# Patient Record
Sex: Female | Born: 1950
Health system: Southern US, Community
[De-identification: ages and names within clinical notes are randomized; demographics above are authoritative.]

## PROBLEM LIST (undated history)

## (undated) DIAGNOSIS — J449 Chronic obstructive pulmonary disease, unspecified: Secondary | ICD-10-CM

## (undated) DIAGNOSIS — R0609 Other forms of dyspnea: Secondary | ICD-10-CM

## (undated) DIAGNOSIS — M069 Rheumatoid arthritis, unspecified: Secondary | ICD-10-CM

## (undated) DIAGNOSIS — R1310 Dysphagia, unspecified: Secondary | ICD-10-CM

## (undated) DIAGNOSIS — E739 Lactose intolerance, unspecified: Secondary | ICD-10-CM

## (undated) DIAGNOSIS — K5790 Diverticulosis of intestine, part unspecified, without perforation or abscess without bleeding: Secondary | ICD-10-CM

## (undated) DIAGNOSIS — E119 Type 2 diabetes mellitus without complications: Secondary | ICD-10-CM

## (undated) DIAGNOSIS — K5900 Constipation, unspecified: Secondary | ICD-10-CM

## (undated) DIAGNOSIS — R600 Localized edema: Secondary | ICD-10-CM

## (undated) DIAGNOSIS — R079 Chest pain, unspecified: Secondary | ICD-10-CM

## (undated) DIAGNOSIS — R0600 Dyspnea, unspecified: Secondary | ICD-10-CM

## (undated) DIAGNOSIS — E785 Hyperlipidemia, unspecified: Secondary | ICD-10-CM

## (undated) DIAGNOSIS — Z8619 Personal history of other infectious and parasitic diseases: Secondary | ICD-10-CM

## (undated) DIAGNOSIS — F329 Major depressive disorder, single episode, unspecified: Secondary | ICD-10-CM

## (undated) DIAGNOSIS — J301 Allergic rhinitis due to pollen: Secondary | ICD-10-CM

## (undated) DIAGNOSIS — Z87891 Personal history of nicotine dependence: Secondary | ICD-10-CM

## (undated) DIAGNOSIS — F419 Anxiety disorder, unspecified: Secondary | ICD-10-CM

## (undated) DIAGNOSIS — F32A Depression, unspecified: Secondary | ICD-10-CM

## (undated) DIAGNOSIS — E669 Obesity, unspecified: Secondary | ICD-10-CM

## (undated) DIAGNOSIS — M1990 Unspecified osteoarthritis, unspecified site: Secondary | ICD-10-CM

## (undated) DIAGNOSIS — H269 Unspecified cataract: Secondary | ICD-10-CM

## (undated) HISTORY — DX: Lactose intolerance, unspecified: E73.9

## (undated) HISTORY — DX: Personal history of nicotine dependence: Z87.891

## (undated) HISTORY — PX: TUBAL LIGATION: SHX77

## (undated) HISTORY — DX: Dysphagia, unspecified: R13.10

## (undated) HISTORY — PX: WISDOM TOOTH EXTRACTION: SHX21

## (undated) HISTORY — PX: OTHER SURGICAL HISTORY: SHX169

## (undated) HISTORY — DX: Type 2 diabetes mellitus without complications: E11.9

## (undated) HISTORY — DX: Other forms of dyspnea: R06.09

## (undated) HISTORY — DX: Constipation, unspecified: K59.00

## (undated) HISTORY — DX: Hyperlipidemia, unspecified: E78.5

## (undated) HISTORY — PX: LIPOMA EXCISION: SHX5283

## (undated) HISTORY — DX: Unspecified osteoarthritis, unspecified site: M19.90

## (undated) HISTORY — DX: Major depressive disorder, single episode, unspecified: F32.9

## (undated) HISTORY — DX: Rheumatoid arthritis, unspecified: M06.9

## (undated) HISTORY — DX: Depression, unspecified: F32.A

## (undated) HISTORY — DX: Localized edema: R60.0

## (undated) HISTORY — PX: BUNIONECTOMY: SHX129

## (undated) HISTORY — DX: Unspecified cataract: H26.9

## (undated) HISTORY — DX: Allergic rhinitis due to pollen: J30.1

## (undated) HISTORY — PX: CATARACT EXTRACTION, BILATERAL: SHX1313

## (undated) HISTORY — DX: Personal history of other infectious and parasitic diseases: Z86.19

## (undated) HISTORY — DX: Dyspnea, unspecified: R06.00

## (undated) HISTORY — DX: Diverticulosis of intestine, part unspecified, without perforation or abscess without bleeding: K57.90

## (undated) HISTORY — DX: Chronic obstructive pulmonary disease, unspecified: J44.9

## (undated) HISTORY — DX: Chest pain, unspecified: R07.9

## (undated) HISTORY — PX: DENTAL SURGERY: SHX609

## (undated) HISTORY — DX: Obesity, unspecified: E66.9

---

## 1988-03-11 HISTORY — PX: VAGINAL HYSTERECTOMY: SUR661

## 2004-03-11 HISTORY — PX: OTHER SURGICAL HISTORY: SHX169

## 2005-11-22 ENCOUNTER — Emergency Department: Payer: Self-pay | Admitting: Emergency Medicine

## 2010-03-11 HISTORY — PX: COLONOSCOPY: SHX174

## 2010-07-27 ENCOUNTER — Emergency Department: Payer: Self-pay | Admitting: Emergency Medicine

## 2010-08-21 ENCOUNTER — Encounter: Payer: Self-pay | Admitting: Family Medicine

## 2010-08-21 ENCOUNTER — Ambulatory Visit (INDEPENDENT_AMBULATORY_CARE_PROVIDER_SITE_OTHER): Payer: BC Managed Care – PPO | Admitting: Family Medicine

## 2010-08-21 VITALS — BP 126/76 | HR 68 | Temp 98.6°F | Ht 63.0 in | Wt 178.1 lb

## 2010-08-21 DIAGNOSIS — Z0000 Encounter for general adult medical examination without abnormal findings: Secondary | ICD-10-CM

## 2010-08-21 DIAGNOSIS — Z1231 Encounter for screening mammogram for malignant neoplasm of breast: Secondary | ICD-10-CM

## 2010-08-21 DIAGNOSIS — F32A Depression, unspecified: Secondary | ICD-10-CM

## 2010-08-21 DIAGNOSIS — F3289 Other specified depressive episodes: Secondary | ICD-10-CM

## 2010-08-21 DIAGNOSIS — F329 Major depressive disorder, single episode, unspecified: Secondary | ICD-10-CM | POA: Insufficient documentation

## 2010-08-21 DIAGNOSIS — N644 Mastodynia: Secondary | ICD-10-CM

## 2010-08-21 MED ORDER — NAPROXEN 500 MG PO TABS: ORAL_TABLET | ORAL | Status: DC

## 2010-08-21 MED ORDER — PAROXETINE HCL 20 MG PO TABS: 30.0000 mg | ORAL_TABLET | ORAL | Status: DC

## 2010-08-21 NOTE — Patient Instructions (Addendum)
Good to meet you today.  Return at your convenience in next few weeks for physical. Return day of or a few days prior fasting for blood work. Pass by [facility]'s office to set up with mammogram. For breast pain - trial of naproxyn twice daily with food for next week then as needed for pain. Increase paxil to 30mg  daily (1 1/2 tablets daily). Call clinic with questions.

## 2010-08-21 NOTE — Assessment & Plan Note (Signed)
Breast exam reassuring, previous cyst scar near site of current nodule, anticipate scar tissue.  As due for mammogram, have set up with this as well. Could also be chest wall strain.  Xray at ER without evidence of rib fx.

## 2010-08-21 NOTE — Assessment & Plan Note (Addendum)
As has responded to paxil in past, continue this med, slowly titrate dose. Pt agrees to maximize one med prior to adjuvant med. Consider PHQ 9 next visit. f/u 1 mo.

## 2010-08-21 NOTE — Progress Notes (Signed)
Addended by: Eustaquio Boyden on: 08/21/2010 02:05 PM   Modules accepted: Orders

## 2010-08-21 NOTE — Progress Notes (Signed)
Subjective:    Patient ID: Wanda Burns, female    DOB: [birth year], 60 y.o.   MRN: [id number]  HPI CC: new patient, several issues  Presents to establish today.  Depression - dx 110s.  no energy, tired, no interest, still feeling depressed.  Has goals/projects and avoids them.  Has been on paxil for last several years (?2005).  Recently increased from 10mg  to 20mg  2007.  Did well for a little bit but recently in last 8-9 mo noticing depression getting worse.  Increased stressors recently.  Breast issue - previously though was pulled muscle and treated 2002, seems to be getting worse.  Told had breast nodules, very painful, feels lateral breast masses.  Very tender.  Has had cyst in past, last year drained.  Endorses 6/10 constant pain daily.  Went to [facility] ER for breast pain, told normal EKG and CXR.  Has also noticed significant weight loss recently, wonders if due to stress.  189 --> 178 lbs in last 2 months.  Denies discharge from breast.  Received records from [facility] and reviewed - normal EKG, CXR, CBC.  H/o inner ear problems.  Smoking currently, interested in quitting.  Currently <1ppd.  Preventative: Tetanus - unsure. Last CPE with blood work - 2007-8.  Got well woman exams at previous PCP's. H/o hysterectomy 1990. Last mammogram - unsure, due. Also states due for colonoscopy.  Medications and allergies reviewed and updated in chart. Past Medical History  Diagnosis Date  . Arthritis     fingers  . Depression   . History of chicken pox   . Hay fever    Past Surgical History  Procedure Date  . Vaginal hysterectomy 1990  . Bilateral tubal   . Lipoma excision     reports that she has been smoking Cigarettes.  She has been smoking about 1 pack per day. She has never used smokeless tobacco. She reports that she drinks alcohol. She reports that she uses illicit drugs. family history includes Alcohol abuse in her father; Colon cancer (age of onset:70) in her maternal uncle;  Coronary artery disease (age of onset:50) in her father; Diabetes in her brother, maternal aunt, and mother; Emphysema in her mother; Hypertension in her mother; and Stroke in her maternal grandmother. No Known Allergies  Review of Systems  Constitutional: Positive for unexpected weight change. Negative for fever, chills, activity change, appetite change and fatigue.  HENT: Negative for hearing loss and neck pain.   Eyes: Negative for visual disturbance.  Respiratory: Positive for cough (attributes to smoking). Negative for choking, chest tightness, shortness of breath and wheezing.   Cardiovascular: Negative for chest pain, palpitations and leg swelling.  Gastrointestinal: Negative for nausea, vomiting, abdominal pain, diarrhea, constipation, blood in stool and abdominal distention.  Genitourinary: Negative for hematuria and difficulty urinating.  Musculoskeletal: Negative for myalgias and arthralgias.  Skin: Negative for rash.  Neurological: Negative for dizziness, seizures, syncope and headaches.  Hematological: Does not bruise/bleed easily.  Psychiatric/Behavioral: Positive for dysphoric mood. The patient is not nervous/anxious.        Objective:   Physical Exam  Nursing note and vitals reviewed. Constitutional: She is oriented to person, place, and time. She appears well-developed and well-nourished. No distress.  HENT:  Head: Normocephalic and atraumatic.  Right Ear: External ear normal.  Left Ear: External ear normal.  Nose: Nose normal.  Mouth/Throat: Oropharynx is clear and moist.  Eyes: Conjunctivae and EOM are normal. Pupils are equal, round, and reactive to light.  Neck: Normal  range of motion. Neck supple. No thyromegaly present.  Cardiovascular: Normal rate, regular rhythm, normal heart sounds and intact distal pulses.   No murmur heard. Pulses:      Radial pulses are 2+ on the right side, and 2+ on the left side.  Pulmonary/Chest: Effort normal and breath sounds  normal. No respiratory distress. She has no wheezes. She has no rales. Right breast exhibits no inverted nipple, no mass, no nipple discharge, no skin change and no tenderness. Left breast exhibits mass, skin change and tenderness. Left breast exhibits no inverted nipple and no nipple discharge. Breasts are symmetrical.         No masses on breast appreciated.  Abdominal: Soft. Bowel sounds are normal. She exhibits no distension and no mass. There is no tenderness. There is no rebound and no guarding.  Musculoskeletal: Normal range of motion.  Lymphadenopathy:    She has no cervical adenopathy.    She has no axillary adenopathy.       Right: No supraclavicular adenopathy present.       Left: No supraclavicular adenopathy present.  Neurological: She is alert and oriented to person, place, and time.       CN grossly intact, station and gait intact  Skin: Skin is warm and dry. No rash noted.  Psychiatric: She has a normal mood and affect. Her behavior is normal. Judgment and thought content normal.          Assessment & Plan:

## 2010-08-23 ENCOUNTER — Encounter: Payer: Self-pay | Admitting: Family Medicine

## 2010-08-23 ENCOUNTER — Ambulatory Visit
Admission: RE | Admit: 2010-08-23 | Discharge: 2010-08-23 | Disposition: A | Discharge status: Home / Self Care | Payer: BC Managed Care – PPO | Source: Ambulatory Visit | Attending: Family Medicine | Admitting: Family Medicine

## 2010-08-23 DIAGNOSIS — N644 Mastodynia: Secondary | ICD-10-CM

## 2010-08-24 ENCOUNTER — Encounter: Payer: Self-pay | Admitting: *Deleted

## 2010-08-24 ENCOUNTER — Telehealth: Payer: Self-pay | Admitting: *Deleted

## 2010-08-24 MED ORDER — CYCLOBENZAPRINE HCL 10 MG PO TABS: 10.0000 mg | ORAL_TABLET | Freq: Two times a day (BID) | ORAL | Status: AC | PRN

## 2010-08-24 NOTE — Telephone Encounter (Signed)
Trial of flexeril.  May take 1/2 pill first to ensure tolerates, may cause sedation.  Start at night.

## 2010-08-24 NOTE — Telephone Encounter (Signed)
Patient was seen earlier this week for breast pain. She says that the medication is not helping with the pain and is asking if she can have something else called in. Uses [facility] on [address].

## 2010-08-27 NOTE — Telephone Encounter (Signed)
Spoke with patient and advised results   

## 2010-09-04 ENCOUNTER — Other Ambulatory Visit (INDEPENDENT_AMBULATORY_CARE_PROVIDER_SITE_OTHER): Payer: BC Managed Care – PPO | Admitting: Family Medicine

## 2010-09-04 DIAGNOSIS — F3289 Other specified depressive episodes: Secondary | ICD-10-CM

## 2010-09-04 DIAGNOSIS — F329 Major depressive disorder, single episode, unspecified: Secondary | ICD-10-CM

## 2010-09-04 DIAGNOSIS — Z0000 Encounter for general adult medical examination without abnormal findings: Secondary | ICD-10-CM

## 2010-09-04 DIAGNOSIS — F32A Depression, unspecified: Secondary | ICD-10-CM

## 2010-09-04 LAB — COMPREHENSIVE METABOLIC PANEL
ALT: 20 U/L (ref 0–35)
AST: 19 U/L (ref 0–37)
Albumin: 4.4 g/dL (ref 3.5–5.2)
Alkaline Phosphatase: 121 U/L — ABNORMAL HIGH (ref 39–117)
Potassium: 4 mEq/L (ref 3.5–5.1)
Sodium: 140 mEq/L (ref 135–145)
Total Protein: 6.6 g/dL (ref 6.0–8.3)

## 2010-09-04 LAB — CBC WITH DIFFERENTIAL/PLATELET
Basophils Absolute: 0 10*3/uL (ref 0.0–0.1)
HCT: 41.3 % (ref 36.0–46.0)
Hemoglobin: 14 g/dL (ref 12.0–15.0)
Lymphs Abs: 3.1 10*3/uL (ref 0.7–4.0)
MCV: 92.9 fl (ref 78.0–100.0)
Monocytes Absolute: 0.5 10*3/uL (ref 0.1–1.0)
Neutro Abs: 5.1 10*3/uL (ref 1.4–7.7)
Platelets: 271 10*3/uL (ref 150.0–400.0)
RDW: 13 % (ref 11.5–14.6)

## 2010-09-04 LAB — LIPID PANEL
HDL: 38.6 mg/dL — ABNORMAL LOW (ref >39.00)
Total CHOL/HDL Ratio: 6
VLDL: 26.8 mg/dL (ref 0.0–40.0)

## 2010-09-05 LAB — LDL CHOLESTEROL, DIRECT: Direct LDL: 146.2 mg/dL

## 2010-09-11 ENCOUNTER — Ambulatory Visit (INDEPENDENT_AMBULATORY_CARE_PROVIDER_SITE_OTHER): Payer: 59 | Admitting: Family Medicine

## 2010-09-11 ENCOUNTER — Encounter: Payer: Self-pay | Admitting: Family Medicine

## 2010-09-11 DIAGNOSIS — F17200 Nicotine dependence, unspecified, uncomplicated: Secondary | ICD-10-CM

## 2010-09-11 DIAGNOSIS — R071 Chest pain on breathing: Secondary | ICD-10-CM

## 2010-09-11 DIAGNOSIS — Z1211 Encounter for screening for malignant neoplasm of colon: Secondary | ICD-10-CM

## 2010-09-11 DIAGNOSIS — Z23 Encounter for immunization: Secondary | ICD-10-CM

## 2010-09-11 DIAGNOSIS — F329 Major depressive disorder, single episode, unspecified: Secondary | ICD-10-CM

## 2010-09-11 DIAGNOSIS — F3289 Other specified depressive episodes: Secondary | ICD-10-CM

## 2010-09-11 DIAGNOSIS — F32A Depression, unspecified: Secondary | ICD-10-CM

## 2010-09-11 DIAGNOSIS — Z0000 Encounter for general adult medical examination without abnormal findings: Secondary | ICD-10-CM

## 2010-09-11 DIAGNOSIS — R0789 Other chest pain: Secondary | ICD-10-CM

## 2010-09-11 MED ORDER — LORATADINE 10 MG PO TABS: 10.0000 mg | ORAL_TABLET | Freq: Every day | ORAL | Status: DC

## 2010-09-11 NOTE — Patient Instructions (Addendum)
Tetanus today. Keep working on lowering cholesterol levels.  Goal bad cholesterol (LDL) for you is <130. Keep working on quitting smoking.  If you need help, let us know.  Look into [email or web address].  Signed up today, expect a call. Pass by Marion's office to schedule colonoscopy. Call insurance to see if physical therapy is covered for muscle wall strain and we will call you to set that up. Good to see you today.  Call us with questions.

## 2010-09-11 NOTE — Progress Notes (Signed)
Subjective:    Patient ID: Wanda Burns, female    DOB: 05/17/1950, 60 y.o.   MRN: [id number]  HPI CC: CPE  Still having issues with left chest wall pain.  Mammogram normal.  Wanda Burns thinks muscle issue.  Flexeril helps but unable to take because of work (sedation).  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  Has been doing this straight for 2 months because others are out.  Wanda Burns is leftie.  Notes worsening with recent increase in job.  Last 2 mornings, improved as hasn't needed to do routes.  Heat helps.  Hasn't tried ice.  Naproxen didn't help, upset stomach.  Depression - mood improved.  Improved motivation, energy level.  paxil 20mg  daily.  Endorsing continued low energy level.  Wants to ask about 2 cysts - one on back of neck, another one left lateral chest wall (painful).  Previously on claritin-D, would like refill.  smoking - 1 ppd.  Precontemplative.  interested in quitline resources  Wt Readings from Last 3 Encounters:  09/11/10 177 lb 1.3 oz (80.323 kg)  08/21/10 178 lb 1.9 oz (80.795 kg)   Reviewed blood work in detail.  Preventative:  Tetanus - Tdap due today. H/o hysterectomy 1990 due to heavy bleeding and pain with periods.  Unsure if ovaries remain. Last mammogram - mammogram 08/2010 normal Also states due for colonoscopy.  Would like to set up as family with h/o colon cancer (age 19).  Medications and allergies reviewed and updated in chart. Patient Active Problem List  Diagnoses  . Breast pain  . Depression   Past Medical History  Diagnosis Date  . Arthritis     fingers  . Depression   . History of chicken pox   . Hay fever    Past Surgical History  Procedure Date  . Vaginal hysterectomy 1990  . Bilateral tubal   . Lipoma excision    History  Substance Use Topics  . Smoking status: Current Everyday Smoker -- 1.0 packs/day    Types: Cigarettes  . Smokeless  tobacco: Never Used   Comment: Less than 1/2 PPD  . Alcohol Use: Yes     Occasionally   Family History  Problem Relation Age of Onset  . Diabetes Mother   . Hypertension Mother   . Emphysema Mother   . Coronary artery disease Father 30  . Alcohol abuse Father   . Diabetes Brother   . Diabetes Maternal Aunt   . Colon cancer Maternal Uncle 70  . Stroke Maternal Grandmother    No Known Allergies Current Outpatient Prescriptions on File Prior to Visit  Medication Sig Dispense Refill  . cyclobenzaprine (FLEXERIL) 10 MG tablet Take 1 tablet (10 mg total) by mouth 2 (two) times daily as needed for muscle spasms. Sedation precautions  30 tablet  1  . loratadine (CLARITIN) 10 MG tablet Take 10 mg by mouth daily.        Marland Kitchen LORazepam (ATIVAN) 0.5 MG tablet Take 0.5 mg by mouth 2 (two) times daily.        . naproxen (NAPROSYN) 500 MG tablet Take one po bid x 1 week then prn pain, take with food  60 tablet  0  . PARoxetine (PAXIL) 20 MG tablet Take 1.5 tablets (30 mg total) by mouth every morning.  45 tablet  3   Review of Systems  Constitutional: Positive for appetite change  and unexpected weight change. Negative for fever, chills, activity change and fatigue.  HENT: Negative for hearing loss and neck pain.   Eyes: Negative for visual disturbance.  Respiratory: Positive for wheezing. Negative for cough, chest tightness and shortness of breath.   Cardiovascular: Negative for chest pain, palpitations and leg swelling.  Gastrointestinal: Negative for nausea, vomiting, abdominal pain, diarrhea, constipation, blood in stool and abdominal distention.  Genitourinary: Negative for hematuria and difficulty urinating.  Musculoskeletal: Negative for myalgias and arthralgias.  Skin: Negative for rash.  Neurological: Negative for dizziness, seizures, syncope and headaches.  Hematological: Does not bruise/bleed easily.  Psychiatric/Behavioral: Negative for dysphoric mood. The patient is not  nervous/anxious.       Objective:   Physical Exam  Nursing note and vitals reviewed. Constitutional: Wanda Burns is oriented to person, place, and time. Wanda Burns appears well-developed and well-nourished. No distress.  HENT:  Head: Normocephalic and atraumatic.  Right Ear: External ear normal.  Left Ear: External ear normal.  Nose: Nose normal.  Mouth/Throat: Oropharynx is clear and moist.  Eyes: Conjunctivae and EOM are normal. Pupils are equal, round, and reactive to light.  Neck: Normal range of motion. Neck supple. No thyromegaly present.  Cardiovascular: Normal rate, normal heart sounds and intact distal pulses.   No murmur heard. Pulses:      Radial pulses are 2+ on the right side, and 2+ on the left side.  Pulmonary/Chest: Effort normal and breath sounds normal. No respiratory distress. Wanda Burns has no wheezes. Wanda Burns has no rales.  Abdominal: Soft. Bowel sounds are normal. Wanda Burns exhibits no distension and no mass. There is no tenderness. There is no rebound and no guarding.  Musculoskeletal: Normal range of motion.       Left lateral chest wall tender to palpation at multiple ribs  Lymphadenopathy:    Wanda Burns has no cervical adenopathy.  Neurological: Wanda Burns is alert and oriented to person, place, and time.       CN grossly intact, station and gait intact  Skin: Skin is warm and dry. No rash noted.       Posterior to neck noninflamed sebaceous cyst.   Left lateral chest wall tender, residual erythema from presumed cyst  Psychiatric: Wanda Burns has a normal mood and affect. Her behavior is normal. Judgment and thought content normal.          Assessment & Plan:

## 2010-09-12 ENCOUNTER — Encounter: Payer: Self-pay | Admitting: Family Medicine

## 2010-09-12 DIAGNOSIS — Z87891 Personal history of nicotine dependence: Secondary | ICD-10-CM | POA: Insufficient documentation

## 2010-09-12 DIAGNOSIS — Z0000 Encounter for general adult medical examination without abnormal findings: Secondary | ICD-10-CM | POA: Insufficient documentation

## 2010-09-12 DIAGNOSIS — R0789 Other chest pain: Secondary | ICD-10-CM | POA: Insufficient documentation

## 2010-09-12 NOTE — Assessment & Plan Note (Signed)
Improved. Continue current dose.

## 2010-09-12 NOTE — Assessment & Plan Note (Addendum)
mammo normal. Unsure if cyst causing pain or muscular.  Anticipate muscular - possible pulled lat dorsi muscle. Anticipate job worsening given repetitive motion of throwing exacerbating pain. Recommended light duty, no throwing for next 2 wks and will refer to PT to work on rib strain. Continue flexeril, ice/heat.  naprosyn didn't help. CXR at ER early June normal.

## 2010-09-12 NOTE — Assessment & Plan Note (Addendum)
Discussed healthy eating.  Reviewed preventative care protocols and updated as needed. Tdap today. Discussed smoking cessation. Pt would like to set up colonoscopy, will refer for this. UTD mammo.  No paps as h/o hysterectomy for benign reasons.

## 2010-09-12 NOTE — Assessment & Plan Note (Signed)
Discussed cessation. Pt interetsed in quit line and to set up with coach, filled out sign up form today.

## 2010-09-25 ENCOUNTER — Encounter: Payer: Self-pay | Admitting: Gastroenterology

## 2010-09-25 ENCOUNTER — Ambulatory Visit (AMBULATORY_SURGERY_CENTER): Payer: 59

## 2010-09-25 VITALS — Ht 63.0 in | Wt 179.7 lb

## 2010-09-25 DIAGNOSIS — Z1211 Encounter for screening for malignant neoplasm of colon: Secondary | ICD-10-CM

## 2010-09-25 DIAGNOSIS — Z800 Family history of malignant neoplasm of digestive organs: Secondary | ICD-10-CM

## 2010-09-25 MED ORDER — PEG-KCL-NACL-NASULF-NA ASC-C 100 G PO SOLR: 1.0000 | Freq: Once | ORAL | Status: AC

## 2010-09-28 ENCOUNTER — Encounter: Payer: Self-pay | Admitting: Family Medicine

## 2010-09-28 ENCOUNTER — Ambulatory Visit (INDEPENDENT_AMBULATORY_CARE_PROVIDER_SITE_OTHER): Payer: 59 | Admitting: Family Medicine

## 2010-09-28 VITALS — BP 116/78 | HR 72 | Temp 98.5°F | Wt 178.1 lb

## 2010-09-28 DIAGNOSIS — R071 Chest pain on breathing: Secondary | ICD-10-CM

## 2010-09-28 DIAGNOSIS — F32A Depression, unspecified: Secondary | ICD-10-CM

## 2010-09-28 DIAGNOSIS — F329 Major depressive disorder, single episode, unspecified: Secondary | ICD-10-CM

## 2010-09-28 DIAGNOSIS — F3289 Other specified depressive episodes: Secondary | ICD-10-CM

## 2010-09-28 DIAGNOSIS — F17200 Nicotine dependence, unspecified, uncomplicated: Secondary | ICD-10-CM

## 2010-09-28 DIAGNOSIS — R0789 Other chest pain: Secondary | ICD-10-CM

## 2010-09-28 NOTE — Patient Instructions (Signed)
Good to see you today, continue to work on cutting back on smoking. Return sometime next month for procedure to try and remove cyst.

## 2010-09-28 NOTE — Progress Notes (Signed)
  Subjective:    Patient ID: Wanda Burns, female    DOB: [birth year], 60 y.o.   MRN: [id number]  HPI CC: 2 wk f/u  Last visit - Still having issues with left chest wall pain. Mammogram normal. Pt thinks muscle issue. Flexeril helps but unable to take because of work (sedation).  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 Had been doing this straight for 2 months because others are out. Pt is left handed.  Notes worsening with recent increase in job. Last 2 mornings, improved as hasn't needed to do routes. Heat helps. Hasn't tried ice. Naproxen didn't help because upset stomach.  Now - pain improved but still there some.  Had recommended physical therapy, pt has held off on this because having financial issues.  Thinks pain worse with stress.  Has noted improvement with light duty as well as less throwing of newspaper.  Currently job hunting for less stress, less exhertion type work.  Wants cyst checked out again.  Has had mult cysts in past removed.  Has seen derm in past.  This cyst on left ribcage/chest wall bothering her because painful.  Wants eventually removed.  Drained on its own 1 mo ago.  Smoking < 1 ppd.  Did not f/u with Quitline resources.  Review of Systems Per HPI    Objective:   Physical Exam  Nursing note and vitals reviewed. Constitutional: She appears well-developed and well-nourished. No distress.  HENT:  Head: Normocephalic and atraumatic.  Mouth/Throat: Oropharynx is clear and moist. No oropharyngeal exudate.  Eyes: Conjunctivae and EOM are normal. Pupils are equal, round, and reactive to light.  Cardiovascular: Normal rate, regular rhythm, normal heart sounds and intact distal pulses.   No murmur heard. Pulmonary/Chest: Effort normal and breath sounds normal. No respiratory distress. She has no wheezes. She has no rales.  Skin: Skin is warm and dry.       Posterior to neck noninflamed sebaceous cyst.     Left lateral chest wall tender, residual erythema from presumed epidermal inclusion cyst noninflammed, central plug, s/p drainage.          Assessment & Plan:

## 2010-09-30 NOTE — Assessment & Plan Note (Signed)
Stable

## 2010-09-30 NOTE — Assessment & Plan Note (Addendum)
Muscle strain but also possibly due to residual cyst present on left chest wall. Pt desires removal. Discussed options of referral to derm vs attempting here.  Pt would like to try here first.  Will have her return for removal.

## 2010-09-30 NOTE — Assessment & Plan Note (Signed)
Continue to encourage cessation. 

## 2010-10-09 ENCOUNTER — Ambulatory Visit (AMBULATORY_SURGERY_CENTER): Payer: 59 | Admitting: Gastroenterology

## 2010-10-09 ENCOUNTER — Encounter: Payer: Self-pay | Admitting: Gastroenterology

## 2010-10-09 VITALS — BP 126/71 | HR 73 | Temp 97.9°F | Resp 18 | Ht 63.0 in | Wt 179.0 lb

## 2010-10-09 DIAGNOSIS — Z1211 Encounter for screening for malignant neoplasm of colon: Secondary | ICD-10-CM

## 2010-10-09 DIAGNOSIS — K5730 Diverticulosis of large intestine without perforation or abscess without bleeding: Secondary | ICD-10-CM

## 2010-10-09 MED ORDER — SODIUM CHLORIDE 0.9 % IV SOLN: 500.0000 mL | INTRAVENOUS | Status: DC

## 2010-10-09 NOTE — Patient Instructions (Signed)
Please read the handouts given to you by your recovery room nurse.   You need to increase the fiber in your diet due to your diverticulosis.  Resume your routine medications today.  If you have any questions, please call  [phone].   Thank-you.

## 2010-10-10 ENCOUNTER — Encounter: Payer: Self-pay | Admitting: Family Medicine

## 2010-10-10 ENCOUNTER — Telehealth: Payer: Self-pay | Admitting: *Deleted

## 2010-10-10 NOTE — Telephone Encounter (Signed)
Id on voicemail, mess. left that we called following up on her procedure yesterday.

## 2010-12-17 ENCOUNTER — Encounter: Payer: Self-pay | Admitting: Family Medicine

## 2010-12-17 ENCOUNTER — Ambulatory Visit (INDEPENDENT_AMBULATORY_CARE_PROVIDER_SITE_OTHER): Payer: 59 | Admitting: Family Medicine

## 2010-12-17 DIAGNOSIS — Z87891 Personal history of nicotine dependence: Secondary | ICD-10-CM

## 2010-12-17 DIAGNOSIS — J329 Chronic sinusitis, unspecified: Secondary | ICD-10-CM

## 2010-12-17 MED ORDER — AMOXICILLIN-POT CLAVULANATE 875-125 MG PO TABS: 1.0000 | ORAL_TABLET | Freq: Two times a day (BID) | ORAL | Status: AC

## 2010-12-17 NOTE — Progress Notes (Signed)
  Subjective:    Patient ID: Wanda Burns, female    DOB: 05-Mar-1951, 60 y.o.   MRN: [id number]  HPI CC: sinusitis?  2 wk h/o ear congestion, then 5d ago started having sinus congestion, sore throat and some dizziness.  Lots of coughing productive of yellow sputum.  Subjective fevers/chills.  Tried robitussin, mucinex DM, claritin, neti pot.  No abd pain, SOB, chest pain.  Quit smoking 4 wks ago, using patch.  Smoked for ~40 yrs, 1 ppd.  No sick contacts at home or work.  No h/o asthma/COPD.  Mother with COPD.  Medications and allergies reviewed and updated in chart.  SHx reviewed and updated (quit smoking)  Review of Systems Per HPI    Objective:   Physical Exam  Nursing note and vitals reviewed. Constitutional: She appears well-developed and well-nourished. No distress.  HENT:  Head: Normocephalic and atraumatic.  Right Ear: Hearing, tympanic membrane, external ear and ear canal normal.  Left Ear: Hearing, tympanic membrane, external ear and ear canal normal.  Nose: No mucosal edema or rhinorrhea. Right sinus exhibits frontal sinus tenderness (mild). Right sinus exhibits no maxillary sinus tenderness. Left sinus exhibits frontal sinus tenderness (mild). Left sinus exhibits no maxillary sinus tenderness.  Mouth/Throat: Uvula is midline, oropharynx is clear and moist and mucous membranes are normal. No oropharyngeal exudate, posterior oropharyngeal edema, posterior oropharyngeal erythema or tonsillar abscesses.  Eyes: Conjunctivae and EOM are normal. Pupils are equal, round, and reactive to light. No scleral icterus.  Neck: Normal range of motion. Neck supple.  Cardiovascular: Normal rate, regular rhythm, normal heart sounds and intact distal pulses.   No murmur heard. Pulmonary/Chest: Effort normal and breath sounds normal. No respiratory distress. She has no wheezes. She has no rales.  Musculoskeletal: She exhibits no edema.  Lymphadenopathy:    She has no cervical adenopathy.    Skin: Skin is warm and dry. No rash noted.  Psychiatric: She has a normal mood and affect.          Assessment & Plan:

## 2010-12-17 NOTE — Patient Instructions (Signed)
You have a sinus infection. Take medicine as prescribed: augmentin twice daily for 10 days. Push fluids and plenty of rest. Nasal saline irrigation or neti pot to help drain sinuses. May use simple mucinex with plenty of fluid to help mobilize mucous. Return if fever >101.5, trouble opening/closing mouth, difficulty swallowing, or worsening.  

## 2010-12-17 NOTE — Assessment & Plan Note (Signed)
New. Going on 2wks.  Treat with augmentin. Update if not improving as expected.

## 2010-12-17 NOTE — Assessment & Plan Note (Signed)
Congratulated on quitting 4 wks ago.

## 2011-04-15 ENCOUNTER — Ambulatory Visit (INDEPENDENT_AMBULATORY_CARE_PROVIDER_SITE_OTHER): Payer: 59 | Admitting: Family Medicine

## 2011-04-15 ENCOUNTER — Encounter: Payer: Self-pay | Admitting: Family Medicine

## 2011-04-15 ENCOUNTER — Telehealth: Payer: Self-pay | Admitting: Family Medicine

## 2011-04-15 VITALS — BP 114/80 | HR 74 | Temp 98.3°F | Wt 177.0 lb

## 2011-04-15 DIAGNOSIS — R238 Other skin changes: Secondary | ICD-10-CM | POA: Insufficient documentation

## 2011-04-15 DIAGNOSIS — J329 Chronic sinusitis, unspecified: Secondary | ICD-10-CM

## 2011-04-15 DIAGNOSIS — L989 Disorder of the skin and subcutaneous tissue, unspecified: Secondary | ICD-10-CM | POA: Insufficient documentation

## 2011-04-15 MED ORDER — AZITHROMYCIN 250 MG PO TABS: ORAL_TABLET | ORAL | Status: AC

## 2011-04-15 MED ORDER — BENZONATATE 200 MG PO CAPS: 200.0000 mg | ORAL_CAPSULE | Freq: Three times a day (TID) | ORAL | Status: AC | PRN

## 2011-04-15 NOTE — Telephone Encounter (Signed)
Triage Record Num: [id number] Operator: Caswell Corwin Patient Name: Wanda Burns Call Date & Time: 04/15/2011 12:45:53PM Patient Phone: [phone] PCP: Patient Gender: Female PCP Fax : Patient DOB: 01-24-1951 Practice Name: [facility] Reason for Call: Caller: Wanda Burns; PCP: Eustaquio Boyden; CB#: [phone]; ; ; Call regarding Cough/Congestion that stated 04/12/11. Pt is couging up yellow phleghm. Afebrile. Triaged URI and also has an ear ache in the L ear. Needs to b e seen in 24 hrs. Appt made at 1500 with Duncan. Protocol(s) Used: Upper Respiratory Infection (URI) Recommended Outcome per Protocol: See Provider within 24 hours Reason for Outcome: Productive cough with colored sputum (other than clear or white sputum) Care Advice: ~ 04/15/2011 12:56:38PM Page 1 of 1 [id number]

## 2011-04-15 NOTE — Patient Instructions (Signed)
Start the antibiotics today.  Take the tessalon for cough.  Drink plenty of fluids, take tylenol as needed, and gargle with warm salt water for your throat.  This should gradually improve.  Take care.  Let us know if you have other concerns.

## 2011-04-15 NOTE — Telephone Encounter (Signed)
Noted. Thanks.  Will route to Dr. Algis Downs.

## 2011-04-15 NOTE — Telephone Encounter (Signed)
Will see pt today.  

## 2011-04-15 NOTE — Assessment & Plan Note (Signed)
Given duration and exam, start zmax with tessalon prn for cough.  Supportive tx o/w, nontoxic.

## 2011-04-15 NOTE — Assessment & Plan Note (Signed)
Refer to derm  ?

## 2011-04-15 NOTE — Progress Notes (Signed)
duration of symptoms: 1 week rhinorrhea:yes congestion:yes ear pain: yes sore throat: started today cough:yes Myalgias: mild other concerns:some wheeze over last week.   Vertigo noted 2 days ago H/o smoking, recently quit No fevers known, but did have chills  Skin lesion on L forehead.  Poorly healing, present for years.    ROS: See HPI.  Otherwise negative.    Meds, vitals, and allergies reviewed.   GEN: nad, alert and oriented HEENT: mucous membranes moist, TM w/o erythema, nasal epithelium injected, OP with cobblestoning, sinus ttp x4, 1cm irritated lesion on L forehead NECK: supple w/o LA CV: rrr. PULM: coarse but ctab, no inc wob, no wheeze ABD: soft, +bs EXT: no edema

## 2011-06-14 ENCOUNTER — Ambulatory Visit (INDEPENDENT_AMBULATORY_CARE_PROVIDER_SITE_OTHER): Payer: 59 | Admitting: Family Medicine

## 2011-06-14 ENCOUNTER — Telehealth: Payer: Self-pay | Admitting: Family Medicine

## 2011-06-14 ENCOUNTER — Encounter: Payer: Self-pay | Admitting: Family Medicine

## 2011-06-14 VITALS — BP 130/82 | HR 76 | Temp 98.4°F | Wt 170.0 lb

## 2011-06-14 DIAGNOSIS — J069 Acute upper respiratory infection, unspecified: Secondary | ICD-10-CM

## 2011-06-14 MED ORDER — AZITHROMYCIN 250 MG PO TABS: ORAL_TABLET | ORAL | Status: AC

## 2011-06-14 MED ORDER — HYDROCOD POLST-CHLORPHEN POLST 10-8 MG/5ML PO LQCR: 5.0000 mL | Freq: Every evening | ORAL | Status: DC | PRN

## 2011-06-14 NOTE — Patient Instructions (Signed)
Take antibiotic- Zpack as directed.  Drink lots of fluids.  Treat sympotmatically with Mucinex, nasal saline irrigation, and Tylenol/Ibuprofen.Cough suppressant at night. Call if not improving as expected in 5-7 days.

## 2011-06-14 NOTE — Telephone Encounter (Signed)
Noted. Thanks.

## 2011-06-14 NOTE — Telephone Encounter (Signed)
Caller: Wanda Burns/Patient; PCP: Eustaquio Boyden; CB#: [phone]; ; ; Calling emergently about s/sx  Sinus Infection, Can Something Be Called In?  States treated two months ago for sinus infection.  States recurrence of similar symptoms with coughing which makes it difficult to breathe.  States has occasional wheezing.  Denies emergent symptoms; states not having difficulty breathing at this time.  Does have fleeting pain on deep breath/cough.  Per protocol, emergent symptoms denied; advised appt within 24 hours.  Appt sched 06/14/11 1445 in office with Dr. Dayton Martes.

## 2011-06-14 NOTE — Progress Notes (Signed)
SUBJECTIVE:  Wanda Burns is a 61 y.o. female who complains of coryza, congestion, sore throat, productive cough, bilateral sinus pain and fever for 14 days. She denies a history of anorexia and chest pain and denies a history of asthma. Patient denies smoke cigarettes.   Patient Active Problem List  Diagnoses  . Depression  . Left-sided chest wall pain  . Healthcare maintenance  . History of smoking  . Sinusitis  . Skin lesion   Past Medical History  Diagnosis Date  . Arthritis     fingers  . Depression   . History of chicken pox   . Hay fever   . Diverticulosis     mild on colonoscopy 2012  . History of smoking     quit 11/2010   Past Surgical History  Procedure Date  . Vaginal hysterectomy 1990  . Bilateral tubal   . Lipoma excision   . Abd [place] 2006    nothing acute, no gallbladder stones  . Colonoscopy 2012    WNL, mild diverticulosis, rpt 10 yrs   History  Substance Use Topics  . Smoking status: Former Smoker -- 1.0 packs/day    Types: Cigarettes    Quit date: 11/10/2010  . Smokeless tobacco: Never Used   Comment: Less than 1/2 PPD  . Alcohol Use: No   Family History  Problem Relation Age of Onset  . Diabetes Mother   . Hypertension Mother   . Emphysema Mother   . Coronary artery disease Mother 7  . Coronary artery disease Father 76    MI death  . Alcohol abuse Father   . Diabetes Brother   . Diabetes Maternal Aunt   . Colon cancer Maternal Aunt   . Colon cancer Maternal Uncle 70  . Diabetes Maternal Uncle   . Stroke Maternal Grandmother    No Known Allergies Current Outpatient Prescriptions on File Prior to Visit  Medication Sig Dispense Refill  . cyclobenzaprine (FLEXERIL) 10 MG tablet Take 1 tablet (10 mg total) by mouth 2 (two) times daily as needed for muscle spasms. Sedation precautions  30 tablet  1  . loratadine (CLARITIN) 10 MG tablet Take 1 tablet (10 mg total) by mouth daily.  30 tablet  11  . LORazepam (ATIVAN) 0.5 MG tablet Take 0.5  mg by mouth 2 (two) times daily as needed.       Marland Kitchen PARoxetine (PAXIL) 20 MG tablet Take 1.5 tablets (30 mg total) by mouth every morning.  45 tablet  3   The PMH, PSH, Social History, Family History, Medications, and allergies have been reviewed in [employer], and have been updated if relevant.  OBJECTIVE: BP 130/82  Pulse 76  Temp(Src) 98.4 F (36.9 C) (Oral)  Wt 170 lb (77.111 kg)  She appears well, vital signs are as noted. Ears normal.  Throat and pharynx normal.  Neck supple. No adenopathy in the neck. Nose is congested. Sinuses  Tender to palpation throughout. The chest is clear, without wheezes or rales.  ASSESSMENT:  sinusitis and bronchitis  PLAN: Given duration and progression of symptoms, will treat for bacterial process with zpack. Symptomatic therapy suggested: push fluids, rest and return office visit prn if symptoms persist or worsen.  Call or return to clinic prn if these symptoms worsen or fail to improve as anticipated.

## 2012-05-29 IMAGING — CR DG CHEST 2V
1 series · 2 of 2 positions shown · non-contrast
Comparison: none

REASON FOR EXAM: chest pain
COMMENTS:

PROCEDURE:     DXR - DXR CHEST PA (OR AP) AND LATERAL  - July 27, 2010  [DATE]
RESULT:     The lungs are clear. The heart and pulmonary vessels are normal.
The bony and mediastinal structures are unremarkable. There is no effusion.
There is no pneumothorax or evidence of congestive failure.

[Series 1: view not recorded · 0.17mm/px · 2 of 2 slices shown]
[im 1/2]
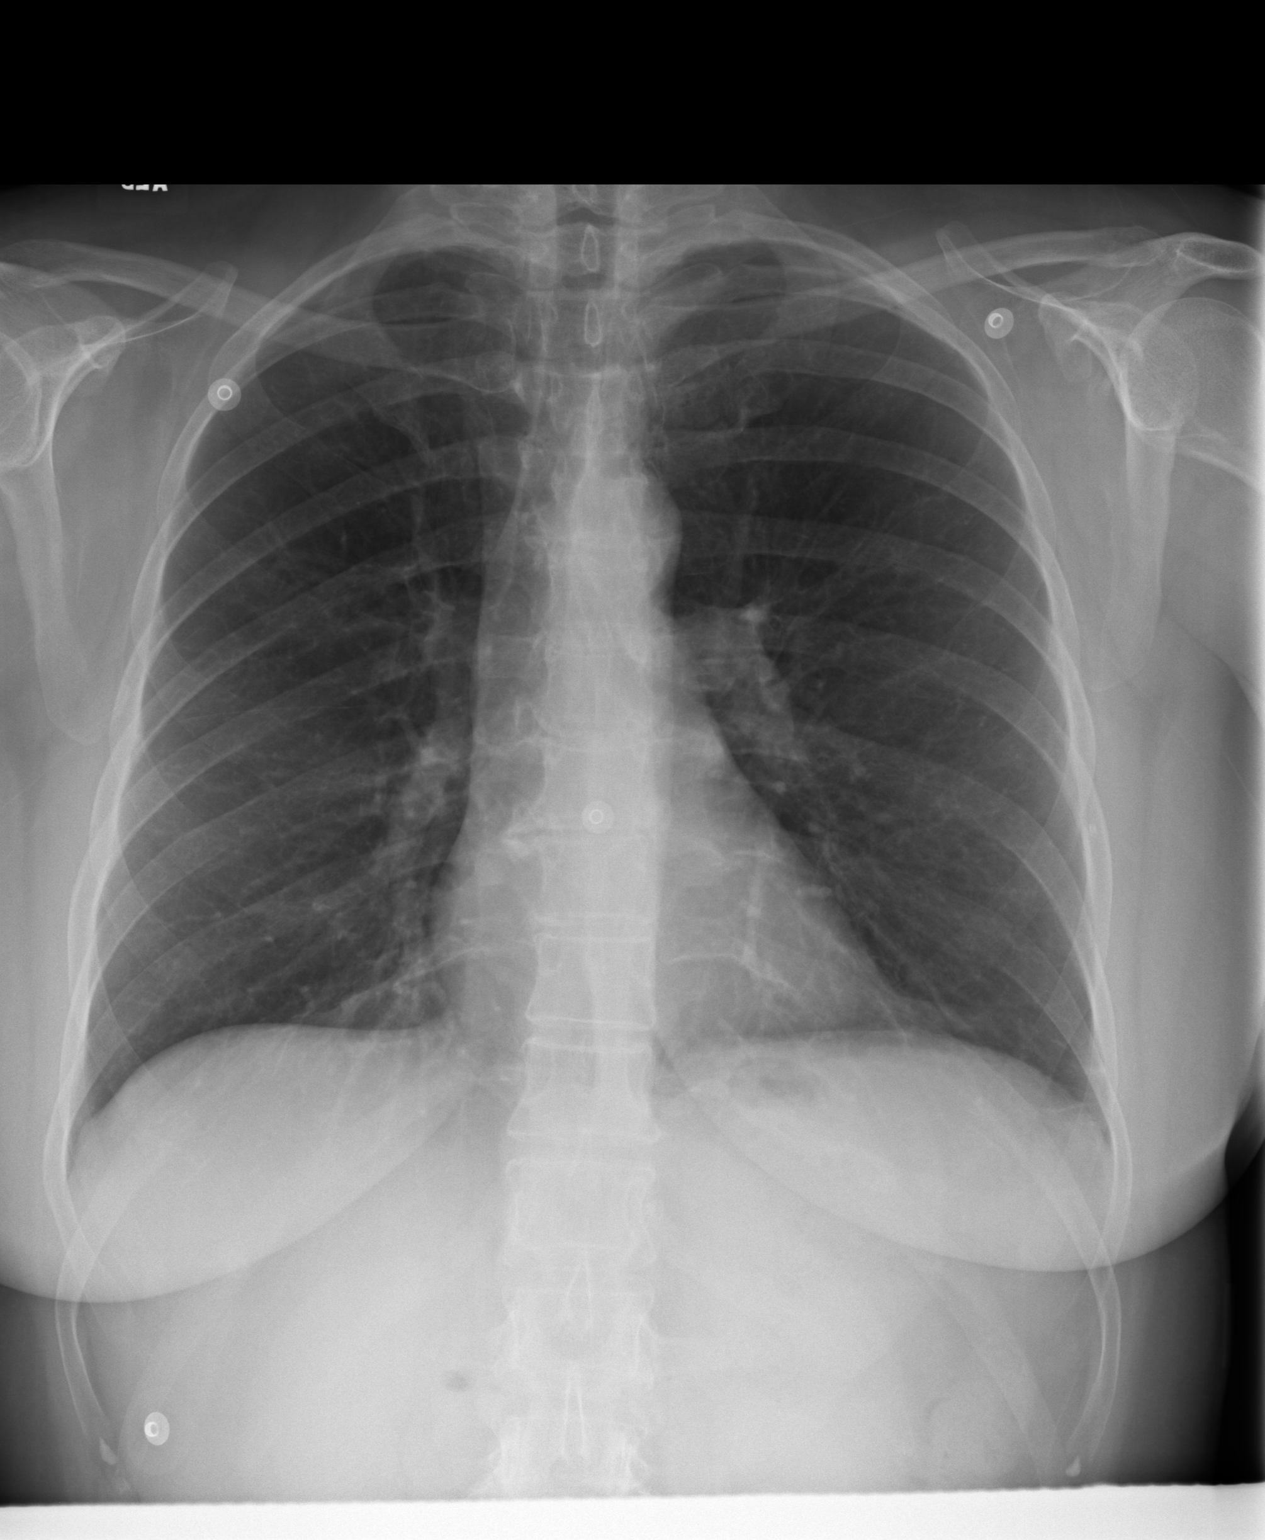
[im 2/2]
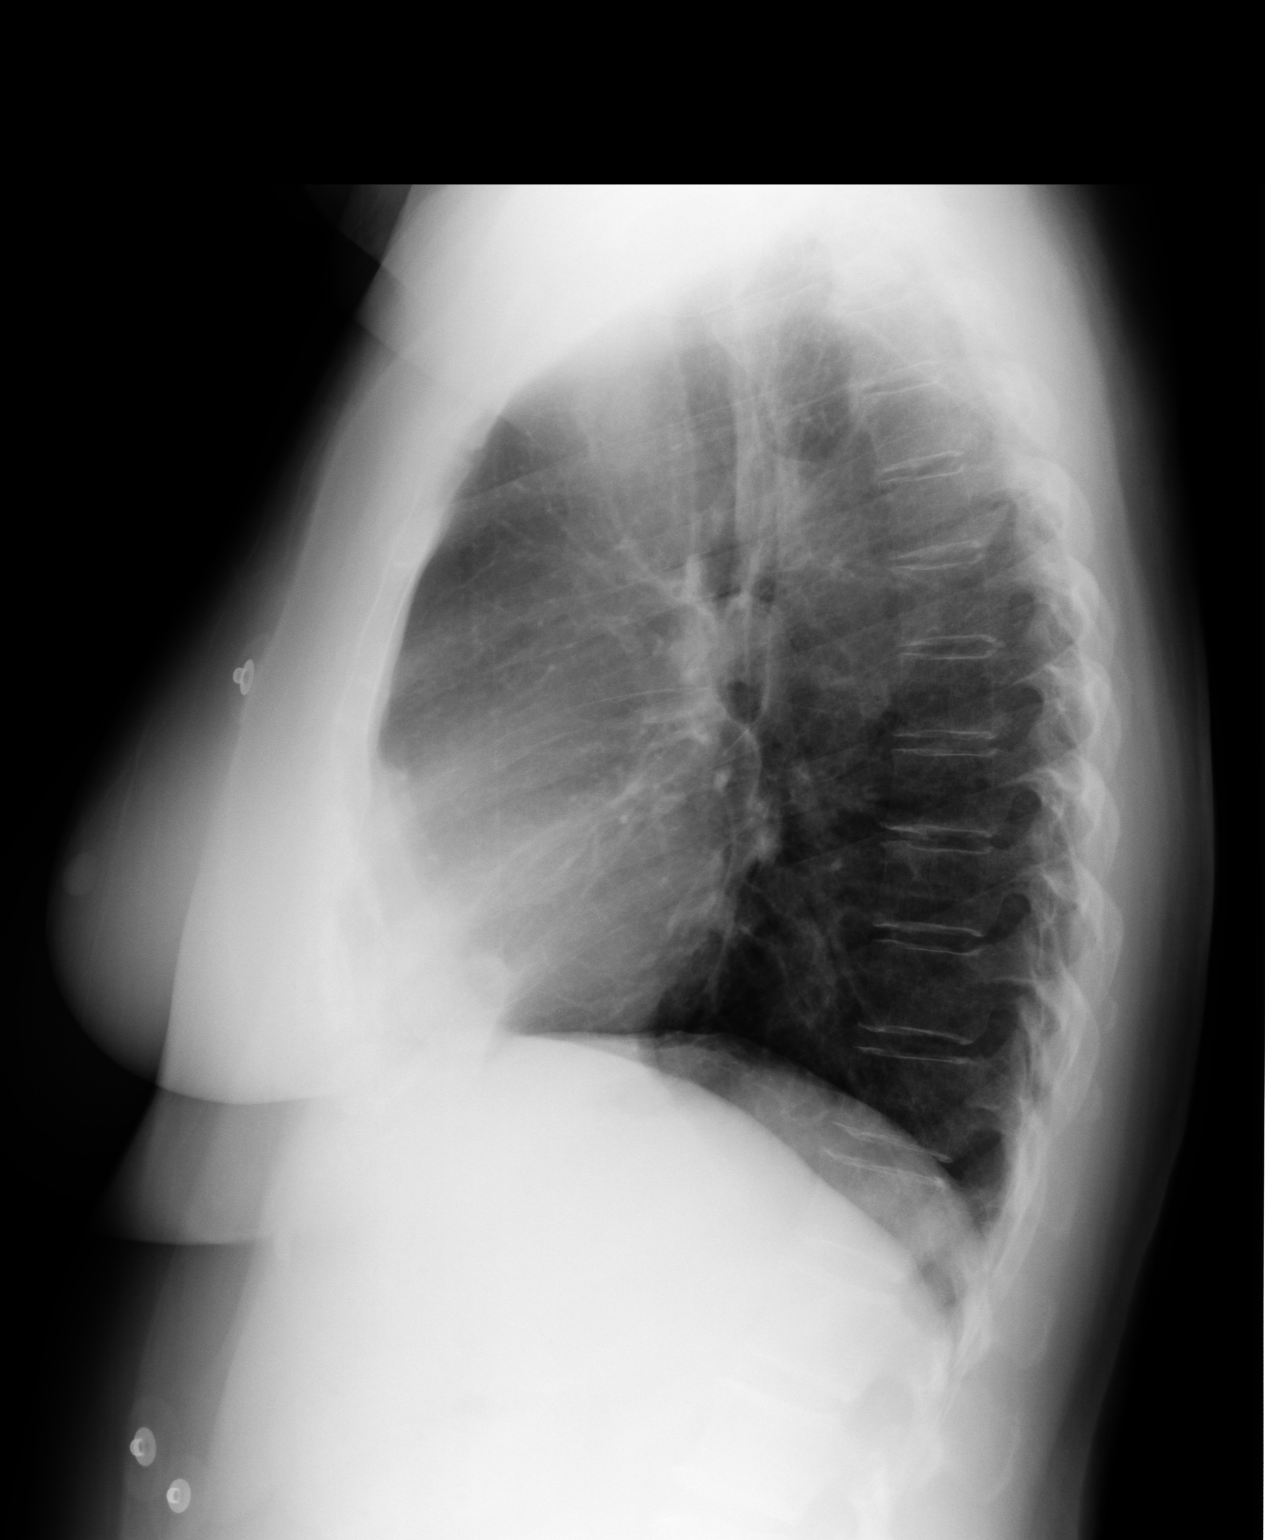

[2 of 2 positions shown; findings below may reference images not displayed]

IMPRESSION: No acute cardiopulmonary disease.

## 2013-10-12 ENCOUNTER — Other Ambulatory Visit: Payer: Self-pay | Admitting: Family Medicine

## 2013-10-12 DIAGNOSIS — N63 Unspecified lump in unspecified breast: Secondary | ICD-10-CM

## 2013-10-18 ENCOUNTER — Ambulatory Visit
Admission: RE | Admit: 2013-10-18 | Discharge: 2013-10-18 | Disposition: A | Discharge status: Home / Self Care | Payer: 59 | Source: Ambulatory Visit | Attending: Family Medicine | Admitting: Family Medicine

## 2013-10-18 ENCOUNTER — Encounter (INDEPENDENT_AMBULATORY_CARE_PROVIDER_SITE_OTHER): Payer: Self-pay

## 2013-10-18 DIAGNOSIS — N63 Unspecified lump in unspecified breast: Secondary | ICD-10-CM

## 2015-03-12 HISTORY — PX: LIPOMA EXCISION: SHX5283

## 2016-02-06 DIAGNOSIS — H409 Unspecified glaucoma: Secondary | ICD-10-CM | POA: Insufficient documentation

## 2017-02-13 ENCOUNTER — Other Ambulatory Visit: Payer: Self-pay | Admitting: Physician Assistant

## 2017-02-13 ENCOUNTER — Ambulatory Visit
Admission: RE | Admit: 2017-02-13 | Discharge: 2017-02-13 | Disposition: A | Discharge status: Home / Self Care | Payer: BLUE CROSS/BLUE SHIELD | Source: Ambulatory Visit | Attending: Physician Assistant | Admitting: Physician Assistant

## 2017-02-13 DIAGNOSIS — R062 Wheezing: Secondary | ICD-10-CM

## 2017-02-13 DIAGNOSIS — R05 Cough: Secondary | ICD-10-CM

## 2017-02-13 DIAGNOSIS — R059 Cough, unspecified: Secondary | ICD-10-CM

## 2017-02-13 DIAGNOSIS — R0602 Shortness of breath: Secondary | ICD-10-CM

## 2017-08-08 ENCOUNTER — Other Ambulatory Visit: Payer: Self-pay | Admitting: Surgery

## 2017-08-08 DIAGNOSIS — R936 Abnormal findings on diagnostic imaging of limbs: Secondary | ICD-10-CM

## 2017-08-18 ENCOUNTER — Other Ambulatory Visit: Payer: Self-pay | Admitting: Surgery

## 2017-08-18 DIAGNOSIS — R229 Localized swelling, mass and lump, unspecified: Principal | ICD-10-CM

## 2017-08-18 DIAGNOSIS — IMO0002 Reserved for concepts with insufficient information to code with codable children: Secondary | ICD-10-CM

## 2017-08-19 ENCOUNTER — Ambulatory Visit
Admission: RE | Admit: 2017-08-19 | Discharge: 2017-08-19 | Disposition: A | Discharge status: Home / Self Care | Payer: BLUE CROSS/BLUE SHIELD | Source: Ambulatory Visit | Attending: Surgery | Admitting: Surgery

## 2017-08-19 DIAGNOSIS — IMO0002 Reserved for concepts with insufficient information to code with codable children: Secondary | ICD-10-CM

## 2017-08-19 DIAGNOSIS — R229 Localized swelling, mass and lump, unspecified: Principal | ICD-10-CM

## 2017-08-22 ENCOUNTER — Ambulatory Visit: Payer: Self-pay | Admitting: Surgery

## 2017-08-22 NOTE — H&P (Signed)
History of Present Illness Imogene Burn. Jarion Hawthorne MD; 08/08/2017 10:19 AM) The patient is a 67 year old female who presents with a complaint of Mass. Referred by Bing Matter, PA-C for right arm mass  This is a 68 year old female with a history of previous lipoma excision from her left back several years ago who presents with a one-year history of an enlarging mass on her right upper lateral arm. This has become fairly tender especially at the lower end of the mass. She has also experienced numbness radiating down into her forearm and her hand. The patient is left-handed. She does not experiences numbness on the left side. She has noticed that the mass has become larger and her symptoms are more persistent.   [place] Rt Upper Extrem Ltd Soft Tissue Non Vascular  Result Date: 08/19/2017 CLINICAL DATA:  Right arm numbness and mass. EXAM: ULTRASOUND RIGHT UPPER EXTREMITY LIMITED TECHNIQUE: Ultrasound examination of the upper extremity soft tissues was performed in the area of clinical concern. COMPARISON:  None FINDINGS: 8.3 x 4.5 x 5.5 mm hyperechoic well-circumscribed mass in the left upper lateral arm within the subcutaneous fat just below the skin surface without internal Doppler flow. There are other smaller hyperechoic foci within the subcutaneous fat adjacently without internal Doppler flow. No posterior acoustic shadowing or enhancement. No other solid or cystic mass. IMPRESSION: Multiple small hyperechoic masses in the subcutaneous fat of the upper lateral left arm with the largest measuring 8.3 x 4.5 x 5.5 mm likely reflecting small lipomas. Electronically Signed   By: Kathreen Devoid   On: 08/19/2017 16:27      Past Surgical History Sabino Gasser; 08/08/2017 9:24 AM) Foot Surgery  Right. Hysterectomy (not due to cancer) - Complete   Diagnostic Studies History Sabino Gasser; 08/08/2017 9:24 AM) Colonoscopy  5-10 years ago  Allergies Sabino Gasser; 08/08/2017 9:25 AM) PredniSONE  *CORTICOSTEROIDS*   Medication History Sabino Gasser; 08/08/2017 9:26 AM) PARoxetine HCl (10MG  Tablet, Oral) Active.  Social History Sabino Gasser; 08/08/2017 9:24 AM) Alcohol use  Occasional alcohol use. Caffeine use  Coffee, Tea. Illicit drug use  Prefer to discuss with provider. Tobacco use  Former smoker.  Family History Sabino Gasser; 08/08/2017 9:24 AM) Alcohol Abuse  Father, Mother. Arthritis  Mother. Depression  Brother, Father, Mother. Heart Disease  Father. Heart disease in female family member before age 13  Heart disease in female family member before age 45  Hypertension  Mother. Respiratory Condition  Brother, Mother.  Pregnancy / Birth History Sabino Gasser; 08/08/2017 9:24 AM) Age at menarche  45 years.  Other Problems Sabino Gasser; 08/08/2017 9:24 AM) Arthritis  Depression  Diverticulosis     Review of Systems Sabino Gasser; 08/08/2017 9:25 AM) General Not Present- Appetite Loss, Chills, Fatigue, Fever, Night Sweats, Weight Gain and Weight Loss. Skin Not Present- Change in Wart/Mole, Dryness, Hives, Jaundice, New Lesions, Non-Healing Wounds, Rash and Ulcer. HEENT Not Present- Earache, Hearing Loss, Hoarseness, Nose Bleed, Oral Ulcers, Ringing in the Ears, Seasonal Allergies, Sinus Pain, Sore Throat, Visual Disturbances, Wears glasses/contact lenses and Yellow Eyes. Breast Not Present- Breast Mass, Breast Pain, Nipple Discharge and Skin Changes. Cardiovascular Not Present- Chest Pain, Difficulty Breathing Lying Down, Leg Cramps, Palpitations, Rapid Heart Rate, Shortness of Breath and Swelling of Extremities. Gastrointestinal Not Present- Abdominal Pain, Bloating, Bloody Stool, Change in Bowel Habits, Chronic diarrhea, Constipation, Difficulty Swallowing, Excessive gas, Gets full quickly at meals, Hemorrhoids, Indigestion, Nausea, Rectal Pain and Vomiting. Female Genitourinary Not Present- Frequency, Nocturia, Painful Urination, Pelvic Pain and  Urgency. Neurological Not Present- Decreased Memory, Fainting, Headaches, Numbness, Seizures, Tingling, Tremor, Trouble walking and Weakness. Psychiatric Not Present- Anxiety, Bipolar, Change in Sleep Pattern, Depression, Fearful and Frequent crying. Endocrine Not Present- Cold Intolerance, Excessive Hunger, Hair Changes, Heat Intolerance, Hot flashes and New Diabetes. Hematology Not Present- Blood Thinners, Easy Bruising, Excessive bleeding, Gland problems, HIV and Persistent Infections.  Vitals Sabino Gasser; 08/08/2017 9:26 AM) 08/08/2017 9:26 AM Weight: 198.13 lb Height: 62in Body Surface Area: 1.9 m Body Mass Index: 36.24 kg/m  Temp.: 56F(Oral)  Pulse: 119 (Regular)  BP: 130/80 (Sitting, Left Arm, Standard)       Physical Exam Rodman Key K. Mayur Duman MD; 08/08/2017 10:20 AM) The physical exam findings are as follows: Note:WDWN in NAD Left back - well-healed incision with no sign of recurrent lipoma Right upper lateral arm - definite asymmetry with a palpable mass. The edges of the mass are not well-defined, except the most distal portion. She is fairly tender at the distal portion. No skin changes. No movement of the mass with flexion/ extension of the elbow.  Normal motor/ sensation right hand    Assessment & Plan Rodman Key K. Westly Hinnant MD; 08/08/2017 10:22 AM) ARM MASS, RIGHT (R22.31) Impression: 4 cm indistinct palpable mass lateral upper right arm. US shows multiple small adjacent subcutaneous lipomas  Current Plans Excision of subcutaneous lipoma(s) - right upper lateral arm.  The surgical procedure has been discussed with the patient.  Potential risks, benefits, alternative treatments, and expected outcomes have been explained.  All of the patient's questions at this time have been answered.  The likelihood of reaching the patient's treatment goal is good.  The patient understand the proposed surgical procedure and wishes to proceed.  Imogene Burn. Georgette Dover, MD,  [facility]  General/ Trauma Surgery  08/22/2017 9:02 AM

## 2017-09-24 ENCOUNTER — Other Ambulatory Visit: Payer: Self-pay

## 2017-09-24 ENCOUNTER — Encounter (HOSPITAL_BASED_OUTPATIENT_CLINIC_OR_DEPARTMENT_OTHER): Payer: Self-pay | Admitting: *Deleted

## 2017-10-02 ENCOUNTER — Encounter (HOSPITAL_BASED_OUTPATIENT_CLINIC_OR_DEPARTMENT_OTHER)
Admission: RE | Disposition: A | Discharge status: Home / Self Care | Payer: Self-pay | Source: Ambulatory Visit | Attending: Surgery

## 2017-10-02 ENCOUNTER — Ambulatory Visit (HOSPITAL_BASED_OUTPATIENT_CLINIC_OR_DEPARTMENT_OTHER): Payer: BLUE CROSS/BLUE SHIELD | Admitting: Anesthesiology

## 2017-10-02 ENCOUNTER — Ambulatory Visit (HOSPITAL_BASED_OUTPATIENT_CLINIC_OR_DEPARTMENT_OTHER)
Admission: RE | Admit: 2017-10-02 | Discharge: 2017-10-02 | Disposition: A | Discharge status: Home / Self Care | Payer: BLUE CROSS/BLUE SHIELD | Source: Ambulatory Visit | Attending: Surgery | Admitting: Surgery

## 2017-10-02 ENCOUNTER — Encounter (HOSPITAL_BASED_OUTPATIENT_CLINIC_OR_DEPARTMENT_OTHER): Payer: Self-pay

## 2017-10-02 ENCOUNTER — Other Ambulatory Visit: Payer: Self-pay

## 2017-10-02 DIAGNOSIS — F419 Anxiety disorder, unspecified: Secondary | ICD-10-CM | POA: Insufficient documentation

## 2017-10-02 DIAGNOSIS — F329 Major depressive disorder, single episode, unspecified: Secondary | ICD-10-CM | POA: Diagnosis not present

## 2017-10-02 DIAGNOSIS — M1990 Unspecified osteoarthritis, unspecified site: Secondary | ICD-10-CM | POA: Insufficient documentation

## 2017-10-02 DIAGNOSIS — Z79899 Other long term (current) drug therapy: Secondary | ICD-10-CM | POA: Insufficient documentation

## 2017-10-02 DIAGNOSIS — Z87891 Personal history of nicotine dependence: Secondary | ICD-10-CM | POA: Diagnosis not present

## 2017-10-02 DIAGNOSIS — D1721 Benign lipomatous neoplasm of skin and subcutaneous tissue of right arm: Secondary | ICD-10-CM | POA: Diagnosis present

## 2017-10-02 HISTORY — DX: Anxiety disorder, unspecified: F41.9

## 2017-10-02 HISTORY — PX: LIPOMA EXCISION: SHX5283

## 2017-10-02 SURGERY — EXCISION LIPOMA
Anesthesia: General | Site: Arm Upper | Laterality: Right

## 2017-10-02 MED ORDER — ONDANSETRON HCL 4 MG/2ML IJ SOLN
INTRAMUSCULAR | Status: AC
  Filled 2017-10-02: qty 2

## 2017-10-02 MED ORDER — SCOPOLAMINE 1 MG/3DAYS TD PT72: 1.0000 | MEDICATED_PATCH | Freq: Once | TRANSDERMAL | Status: DC | PRN

## 2017-10-02 MED ORDER — BUPIVACAINE-EPINEPHRINE (PF) 0.25% -1:200000 IJ SOLN
INTRAMUSCULAR | Status: AC
  Filled 2017-10-02: qty 90

## 2017-10-02 MED ORDER — FENTANYL CITRATE (PF) 100 MCG/2ML IJ SOLN: 25.0000 ug | INTRAMUSCULAR | Status: DC | PRN

## 2017-10-02 MED ORDER — LIDOCAINE HCL (CARDIAC) PF 100 MG/5ML IV SOSY
PREFILLED_SYRINGE | INTRAVENOUS | Status: DC | PRN
  Administered 2017-10-02: 60 mg via INTRAVENOUS

## 2017-10-02 MED ORDER — MIDAZOLAM HCL 2 MG/2ML IJ SOLN
INTRAMUSCULAR | Status: AC
  Filled 2017-10-02: qty 2

## 2017-10-02 MED ORDER — CEFAZOLIN SODIUM-DEXTROSE 2-4 GM/100ML-% IV SOLN: 2.0000 g | INTRAVENOUS | Status: DC

## 2017-10-02 MED ORDER — ONDANSETRON HCL 4 MG/2ML IJ SOLN
INTRAMUSCULAR | Status: DC | PRN
  Administered 2017-10-02: 4 mg via INTRAVENOUS

## 2017-10-02 MED ORDER — HYDROCODONE-ACETAMINOPHEN 5-325 MG PO TABS: 1.0000 | ORAL_TABLET | Freq: Four times a day (QID) | ORAL | 0 refills | Status: DC | PRN

## 2017-10-02 MED ORDER — LACTATED RINGERS IV SOLN
INTRAVENOUS | Status: DC
  Administered 2017-10-02: 07:00:00 via INTRAVENOUS

## 2017-10-02 MED ORDER — ACETAMINOPHEN 500 MG PO TABS
ORAL_TABLET | ORAL | Status: AC
  Filled 2017-10-02: qty 2

## 2017-10-02 MED ORDER — FENTANYL CITRATE (PF) 100 MCG/2ML IJ SOLN
INTRAMUSCULAR | Status: AC
  Filled 2017-10-02: qty 2

## 2017-10-02 MED ORDER — BUPIVACAINE-EPINEPHRINE 0.25% -1:200000 IJ SOLN
INTRAMUSCULAR | Status: DC | PRN
  Administered 2017-10-02: 10 mL

## 2017-10-02 MED ORDER — ONDANSETRON HCL 4 MG/2ML IJ SOLN: 4.0000 mg | Freq: Once | INTRAMUSCULAR | Status: DC | PRN

## 2017-10-02 MED ORDER — CEFAZOLIN SODIUM-DEXTROSE 2-3 GM-%(50ML) IV SOLR
INTRAVENOUS | Status: DC | PRN
  Administered 2017-10-02: 2 g via INTRAVENOUS

## 2017-10-02 MED ORDER — CHLORHEXIDINE GLUCONATE CLOTH 2 % EX PADS: 6.0000 | MEDICATED_PAD | Freq: Once | CUTANEOUS | Status: DC

## 2017-10-02 MED ORDER — CEFAZOLIN SODIUM-DEXTROSE 2-4 GM/100ML-% IV SOLN
INTRAVENOUS | Status: AC
  Filled 2017-10-02: qty 100

## 2017-10-02 MED ORDER — DEXAMETHASONE SODIUM PHOSPHATE 10 MG/ML IJ SOLN
INTRAMUSCULAR | Status: AC
  Filled 2017-10-02: qty 1

## 2017-10-02 MED ORDER — EPHEDRINE SULFATE 50 MG/ML IJ SOLN
INTRAMUSCULAR | Status: DC | PRN
  Administered 2017-10-02: 10 mg via INTRAVENOUS

## 2017-10-02 MED ORDER — MIDAZOLAM HCL 2 MG/2ML IJ SOLN
1.0000 mg | INTRAMUSCULAR | Status: DC | PRN
  Administered 2017-10-02: 1 mg via INTRAVENOUS

## 2017-10-02 MED ORDER — PROPOFOL 10 MG/ML IV BOLUS
INTRAVENOUS | Status: DC | PRN
  Administered 2017-10-02: 120 mg via INTRAVENOUS

## 2017-10-02 MED ORDER — BACITRACIN ZINC 500 UNIT/GM EX OINT
TOPICAL_OINTMENT | CUTANEOUS | Status: AC
  Filled 2017-10-02: qty 0.9

## 2017-10-02 MED ORDER — PROPOFOL 10 MG/ML IV BOLUS
INTRAVENOUS | Status: AC
  Filled 2017-10-02: qty 40

## 2017-10-02 MED ORDER — FENTANYL CITRATE (PF) 100 MCG/2ML IJ SOLN
50.0000 ug | INTRAMUSCULAR | Status: DC | PRN
  Administered 2017-10-02: 50 ug via INTRAVENOUS

## 2017-10-02 MED ORDER — ACETAMINOPHEN 500 MG PO TABS
1000.0000 mg | ORAL_TABLET | Freq: Once | ORAL | Status: AC
  Administered 2017-10-02: 1000 mg via ORAL

## 2017-10-02 MED ORDER — LIDOCAINE HCL (CARDIAC) PF 100 MG/5ML IV SOSY
PREFILLED_SYRINGE | INTRAVENOUS | Status: AC
  Filled 2017-10-02: qty 5

## 2017-10-02 SURGICAL SUPPLY — 31 items
BANDAGE ACE 4X5 VEL STRL LF (GAUZE/BANDAGES/DRESSINGS) ×3 IMPLANT
BENZOIN TINCTURE PRP APPL 2/3 (GAUZE/BANDAGES/DRESSINGS) ×3 IMPLANT
BLADE SURG 15 STRL LF DISP TIS (BLADE) ×1 IMPLANT
BLADE SURG 15 STRL SS (BLADE) ×2
CHLORAPREP W/TINT 26ML (MISCELLANEOUS) ×3 IMPLANT
CLOSURE WOUND 1/2 X4 (GAUZE/BANDAGES/DRESSINGS) ×1
COVER BACK TABLE 60X90IN (DRAPES) ×3 IMPLANT
COVER MAYO STAND STRL (DRAPES) ×3 IMPLANT
DRAPE LAPAROTOMY 100X72 PEDS (DRAPES) ×3 IMPLANT
DRAPE UTILITY XL STRL (DRAPES) ×3 IMPLANT
DRSG TEGADERM 4X4.75 (GAUZE/BANDAGES/DRESSINGS) ×3 IMPLANT
ELECT COATED BLADE 2.86 ST (ELECTRODE) ×3 IMPLANT
ELECT REM PT RETURN 9FT ADLT (ELECTROSURGICAL) ×3
ELECTRODE REM PT RTRN 9FT ADLT (ELECTROSURGICAL) ×1 IMPLANT
GAUZE SPONGE 4X4 12PLY STRL LF (GAUZE/BANDAGES/DRESSINGS) ×3 IMPLANT
GLOVE BIO SURGEON STRL SZ7 (GLOVE) ×3 IMPLANT
GLOVE BIOGEL PI IND STRL 7.5 (GLOVE) ×1 IMPLANT
GLOVE BIOGEL PI INDICATOR 7.5 (GLOVE) ×2
GOWN STRL REUS W/ TWL LRG LVL3 (GOWN DISPOSABLE) ×2 IMPLANT
GOWN STRL REUS W/TWL LRG LVL3 (GOWN DISPOSABLE) ×4
NEEDLE HYPO 25X1 1.5 SAFETY (NEEDLE) ×3 IMPLANT
NS IRRIG 1000ML POUR BTL (IV SOLUTION) ×3 IMPLANT
PACK BASIN DAY SURGERY FS (CUSTOM PROCEDURE TRAY) ×3 IMPLANT
PENCIL BUTTON HOLSTER BLD 10FT (ELECTRODE) ×3 IMPLANT
SLEEVE SCD COMPRESS KNEE MED (MISCELLANEOUS) ×3 IMPLANT
STRIP CLOSURE SKIN 1/2X4 (GAUZE/BANDAGES/DRESSINGS) ×2 IMPLANT
SUT MON AB 4-0 PC3 18 (SUTURE) ×3 IMPLANT
SUT VIC AB 3-0 SH 27 (SUTURE) ×2
SUT VIC AB 3-0 SH 27X BRD (SUTURE) ×1 IMPLANT
SYR CONTROL 10ML LL (SYRINGE) ×3 IMPLANT
TOWEL GREEN STERILE FF (TOWEL DISPOSABLE) ×3 IMPLANT

## 2017-10-02 NOTE — H&P (Signed)
History of Present Illness  The patient is a 67 year old female who presents with a complaint of Mass. Referred by Bing Matter, PA-C for right arm mass  This is a 67 year old female with a history of previous lipoma excision from her left back several years ago who presents with a one-year history of an enlarging mass on her right upper lateral arm. This has become fairly tender especially at the lower end of the mass. She has also experienced numbness radiating down into her forearm and her hand. The patient is left-handed. She does not experiences numbness on the left side. She has noticed that the mass has become larger and her symptoms are more persistent.    ImagingResults  [place] Rt Upper Extrem Ltd Soft Tissue Non Vascular  Result Date: 08/19/2017 CLINICAL DATA:  Right arm numbness and mass. EXAM: ULTRASOUND RIGHT UPPER EXTREMITY LIMITED TECHNIQUE: Ultrasound examination of the upper extremity soft tissues was performed in the area of clinical concern. COMPARISON:  None FINDINGS: 8.3 x 4.5 x 5.5 mm hyperechoic well-circumscribed mass in the left upper lateral arm within the subcutaneous fat just below the skin surface without internal Doppler flow. There are other smaller hyperechoic foci within the subcutaneous fat adjacently without internal Doppler flow. No posterior acoustic shadowing or enhancement. No other solid or cystic mass. IMPRESSION: Multiple small hyperechoic masses in the subcutaneous fat of the upper lateral left arm with the largest measuring 8.3 x 4.5 x 5.5 mm likely reflecting small lipomas. Electronically Signed   By: Kathreen Devoid   On: 08/19/2017 16:27       Past Surgical History  Foot Surgery  Right. Hysterectomy (not due to cancer) - Complete   Diagnostic Studies History  Colonoscopy  5-10 years ago  Allergies  PredniSONE *CORTICOSTEROIDS*   Medication History PARoxetine HCl (10MG  Tablet, Oral) Active.  Social History  Alcohol use   Occasional alcohol use. Caffeine use  Coffee, Tea. Illicit drug use  Prefer to discuss with provider. Tobacco use  Former smoker.  Family History  Alcohol Abuse  Father, Mother. Arthritis  Mother. Depression  Brother, Father, Mother. Heart Disease  Father. Heart disease in female family member before age 51  Heart disease in female family member before age 75  Hypertension  Mother. Respiratory Condition  Brother, Mother.  Pregnancy / Birth History  Age at menarche  74 years.  Other Problems  Arthritis  Depression  Diverticulosis     Review of Systems  General Not Present- Appetite Loss, Chills, Fatigue, Fever, Night Sweats, Weight Gain and Weight Loss. Skin Not Present- Change in Wart/Mole, Dryness, Hives, Jaundice, New Lesions, Non-Healing Wounds, Rash and Ulcer. HEENT Not Present- Earache, Hearing Loss, Hoarseness, Nose Bleed, Oral Ulcers, Ringing in the Ears, Seasonal Allergies, Sinus Pain, Sore Throat, Visual Disturbances, Wears glasses/contact lenses and Yellow Eyes. Breast Not Present- Breast Mass, Breast Pain, Nipple Discharge and Skin Changes. Cardiovascular Not Present- Chest Pain, Difficulty Breathing Lying Down, Leg Cramps, Palpitations, Rapid Heart Rate, Shortness of Breath and Swelling of Extremities. Gastrointestinal Not Present- Abdominal Pain, Bloating, Bloody Stool, Change in Bowel Habits, Chronic diarrhea, Constipation, Difficulty Swallowing, Excessive gas, Gets full quickly at meals, Hemorrhoids, Indigestion, Nausea, Rectal Pain and Vomiting. Female Genitourinary Not Present- Frequency, Nocturia, Painful Urination, Pelvic Pain and Urgency. Neurological Not Present- Decreased Memory, Fainting, Headaches, Numbness, Seizures, Tingling, Tremor, Trouble walking and Weakness. Psychiatric Not Present- Anxiety, Bipolar, Change in Sleep Pattern, Depression, Fearful and Frequent crying. Endocrine Not Present- Cold Intolerance, Excessive Hunger, Hair  Changes,  Heat Intolerance, Hot flashes and New Diabetes. Hematology Not Present- Blood Thinners, Easy Bruising, Excessive bleeding, Gland problems, HIV and Persistent Infections.  Vitals  Weight: 198.13 lb Height: 62in Body Surface Area: 1.9 m Body Mass Index: 36.24 kg/m  Temp.: 90F(Oral)  Pulse: 119 (Regular)  BP: 130/80 (Sitting, Left Arm, Standard)       Physical Exam  The physical exam findings are as follows: Note:WDWN in NAD Left back - well-healed incision with no sign of recurrent lipoma Right upper lateral arm - definite asymmetry with a palpable mass. The edges of the mass are not well-defined, except the most distal portion. She is fairly tender at the distal portion. No skin changes. No movement of the mass with flexion/ extension of the elbow.  Normal motor/ sensation right hand    Assessment & Plan  ARM MASS, RIGHT (R22.31) Impression: 4 cm indistinct palpable mass lateral upper right arm. US shows multiple small adjacent subcutaneous lipomas  Current Plans Excision of subcutaneous lipoma(s) - right upper lateral arm.  The surgical procedure has been discussed with the patient.  Potential risks, benefits, alternative treatments, and expected outcomes have been explained.  All of the patient's questions at this time have been answered.  The likelihood of reaching the patient's treatment goal is good.  The patient understand the proposed surgical procedure and wishes to proceed.    Imogene Burn. Georgette Dover, MD, [facility]  General/ Trauma Surgery  10/02/2017 7:21 AM

## 2017-10-02 NOTE — Discharge Instructions (Signed)
[place] Office Phone Number [phone]  Lipoma Excision: POST OP INSTRUCTIONS  Always review your discharge instruction sheet given to you by the facility where your surgery was performed.  IF YOU HAVE DISABILITY OR FAMILY LEAVE FORMS, YOU MUST BRING THEM TO THE OFFICE FOR PROCESSING.  DO NOT GIVE THEM TO YOUR DOCTOR.  1. A prescription for pain medication may be given to you upon discharge.  Take your pain medication as prescribed, if needed.  If narcotic pain medicine is not needed, then you may take acetaminophen (Tylenol) or ibuprofen (Advil) as needed. 2. Take your usually prescribed medications unless otherwise directed 3. If you need a refill on your pain medication, please contact your pharmacy.  They will contact our office to request authorization.  Prescriptions will not be filled after 5pm or on week-ends. 4. You should eat very light the first 24 hours after surgery, such as soup, crackers, pudding, etc.  Resume your normal diet the day after surgery. 5. Most patients will experience some swelling and bruising around the surgical site.  Ice packs will help.  Swelling and bruising can take several days to resolve.  6. It is common to experience some constipation if taking pain medication after surgery.  Increasing fluid intake and taking a stool softener will usually help or prevent this problem from occurring.  A mild laxative (Milk of Magnesia or Miralax) should be taken according to package directions if there are no bowel movements after 48 hours. 7. You may remove your bandages 48 hours after surgery, and you may shower at that time.  You will have steri-strips (small skin tapes) in place directly over the incision.  These strips should be left on the skin for 7-10 days.   8. ACTIVITIES:  You may resume regular daily activities (gradually increasing) beginning the next day.   You may have sexual intercourse when it is comfortable. a. You may drive when you no  longer are taking prescription pain medication, you can comfortably wear a seatbelt, and you can safely maneuver your car and apply brakes. b. RETURN TO WORK:  1-2 weeks 9. You should see your doctor in the office for a follow-up appointment approximately two to three weeks after your surgery.    WHEN TO CALL YOUR DOCTOR: 1. Fever over 101.0 2. Nausea and/or vomiting. 3. Extreme swelling or bruising. 4. Continued bleeding from incision. 5. Increased pain, redness, or drainage from the incision.  The clinic staff is available to answer your questions during regular business hours.  Please dont hesitate to call and ask to speak to one of the nurses for clinical concerns.  If you have a medical emergency, go to the nearest emergency room or call 911.  A surgeon from [facility] is always on call at the hospital.  For further questions, please visit [email or web address]     Post Anesthesia Home Care Instructions  Activity: Get plenty of rest for the remainder of the day. A responsible individual must stay with you for 24 hours following the procedure.  For the next 24 hours, DO NOT: -Drive a car -Paediatric nurse -Drink alcoholic beverages -Take any medication unless instructed by your physician -Make any legal decisions or sign important papers.  Meals: Start with liquid foods such as gelatin or soup. Progress to regular foods as tolerated. Avoid greasy, spicy, heavy foods. If nausea and/or vomiting occur, drink only clear liquids until the nausea and/or vomiting subsides. Call your physician if vomiting continues.  Special Instructions/Symptoms:  Your throat may feel dry or sore from the anesthesia or the breathing tube placed in your throat during surgery. If this causes discomfort, gargle with warm salt water. The discomfort should disappear within 24 hours.  If you had a scopolamine patch placed behind your ear for the management of post- operative nausea and/or  vomiting:  1. The medication in the patch is effective for 72 hours, after which it should be removed.  Wrap patch in a tissue and discard in the trash. Wash hands thoroughly with soap and water. 2. You may remove the patch earlier than 72 hours if you experience unpleasant side effects which may include dry mouth, dizziness or visual disturbances. 3. Avoid touching the patch. Wash your hands with soap and water after contact with the patch.   Call your surgeon if you experience:   1.  Fever over 101.0. 2.  Inability to urinate. 3.  Nausea and/or vomiting. 4.  Extreme swelling or bruising at the surgical site. 5.  Continued bleeding from the incision. 6.  Increased pain, redness or drainage from the incision. 7.  Problems related to your pain medication. 8.  Any problems and/or concerns

## 2017-10-02 NOTE — Anesthesia Preprocedure Evaluation (Addendum)
Anesthesia Evaluation  Patient identified by MRN, date of birth, ID band Patient awake    Reviewed: Allergy & Precautions, NPO status , Patient's Chart, lab work & pertinent test results  Airway Mallampati: II  TM Distance: >3 FB Neck ROM: Full    Dental  (+) Dental Advisory Given, Lower Dentures, Upper Dentures   Pulmonary former smoker,    Pulmonary exam normal breath sounds clear to auscultation       Cardiovascular Exercise Tolerance: Good negative cardio ROS Normal cardiovascular exam Rhythm:Regular Rate:Normal     Neuro/Psych PSYCHIATRIC DISORDERS Anxiety Depression negative neurological ROS     GI/Hepatic negative GI ROS, Neg liver ROS,   Endo/Other  negative endocrine ROSObesity   Renal/GU negative Renal ROS     Musculoskeletal  (+) Arthritis , Osteoarthritis,    Abdominal   Peds  Hematology negative hematology ROS (+)   Anesthesia Other Findings Day of surgery medications reviewed with the patient.  Reproductive/Obstetrics negative OB ROS S/p BTL                            Anesthesia Physical Anesthesia Plan  ASA: II  Anesthesia Plan: General   Post-op Pain Management:    Induction: Intravenous  PONV Risk Score and Plan: 3 and Midazolam, Dexamethasone and Ondansetron  Airway Management Planned: LMA  Additional Equipment:   Intra-op Plan:   Post-operative Plan: Extubation in OR  Informed Consent: I have reviewed the patients History and Physical, chart, labs and discussed the procedure including the risks, benefits and alternatives for the proposed anesthesia with the patient or authorized representative who has indicated his/her understanding and acceptance.   Dental advisory given  Plan Discussed with: CRNA  Anesthesia Plan Comments:         Anesthesia Quick Evaluation

## 2017-10-02 NOTE — Transfer of Care (Signed)
Immediate Anesthesia Transfer of Care Note  Patient: Wanda Burns  Procedure(s) Performed: EXCISION OF SUBCUTANEOUS LIPOMA OF RIGHT UPPER LATERAL ARM (Right Arm Upper)  Patient Location: PACU  Anesthesia Type:General  Level of Consciousness: awake, alert  and oriented  Airway & Oxygen Therapy: Patient Spontanous Breathing and Patient connected to face mask oxygen  Post-op Assessment: Report given to RN and Post -op Vital signs reviewed and stable  Post vital signs: Reviewed and stable  Last Vitals:  Vitals Value Taken Time  BP    Temp    Pulse    Resp    SpO2      Last Pain:  Vitals:   10/02/17 0620  TempSrc: Oral  PainSc: 0-No pain         Complications: No apparent anesthesia complications

## 2017-10-02 NOTE — Op Note (Signed)
Preop diagnosis: Multiple tender subcutaneous lipomas right upper extremity Postop diagnosis: Same Procedure performed: Excision of multiple subcentimeter subcutaneous lipomas right upper extremity  Surgeon:Eliazer Hemphill K Crystalle Popwell Anesthesia: General Indications:This is a 67 year old female with a history of previous lipoma excision from her left back several years ago who presents with a one-year history of an enlarging mass on her right upper lateral arm. This has become fairly tender especially at the lower end of the mass. She has also experienced numbness radiating down into her forearm and her hand. The patient is left-handed. She does not experience numbness on the left side. She has noticed that the mass has become larger and her symptoms are more persistent.    ImagingResults  [place] Rt Upper Extrem Ltd Soft Tissue Non Vascular  Result Date: 08/19/2017 CLINICAL DATA: Right arm numbness and mass. EXAM: ULTRASOUND RIGHT UPPER EXTREMITY LIMITED TECHNIQUE: Ultrasound examination of the upper extremity soft tissues was performed in the area of clinical concern. COMPARISON: None FINDINGS: 8.3 x 4.5 x 5.5 mm hyperechoic well-circumscribed mass in the left upper lateral arm within the subcutaneous fat just below the skin surface without internal Doppler flow. There are other smaller hyperechoic foci within the subcutaneous fat adjacently without internal Doppler flow. No posterior acoustic shadowing or enhancement. No other solid or cystic mass. IMPRESSION: Multiple small hyperechoic masses in the subcutaneous fat of the upper lateral left arm with the largest measuring 8.3 x 4.5 x 5.5 mm likely reflecting small lipomas. Electronically Signed By: Kathreen Devoid On: 08/19/2017 16:27      Description of procedure: The patient is brought to the operating room and placed in the supine position on the operating room table with her right arm at her side.  After an adequate level of general anesthesia  was obtained, the area over the palpable mass was prepped with ChloraPrep and draped in sterile fashion.  A timeout was taken to ensure the proper patient and proper procedure.  We made a 3 cm incision over the center of the mass.  Dissection was carried down to the dermis and the superficial subcutaneous adipose tissue.  The patient does not have a single distinct large lipoma.  She has multiple hard nodular areas in the subcutaneous fat.  We excised most of the subcutaneous fat overlying the biceps fascia.  We took out a total area of 4 x 3 cm.  This was sent for pathologic examination.  We irrigated the wound thoroughly and inspected for hemostasis.  We closed the wound with a deep layer of 3-0 Vicryl and a subcuticular layer 4-0 Monocryl.  Benzoin Steri-Strips were applied.  I wrapped the patient's upper arm with an Ace wrap for some compression.  She was then extubated and brought to the recovery room in stable condition.  All sponge, instrument, and needle counts are correct.  Imogene Burn. Georgette Dover, MD, [facility]  General/ Trauma Surgery  10/02/2017 8:16 AM

## 2017-10-02 NOTE — Anesthesia Procedure Notes (Signed)
Procedure Name: LMA Insertion Performed by: Faten Frieson M, CRNA Pre-anesthesia Checklist: Patient identified, Emergency Drugs available, Suction available, Patient being monitored and Timeout performed Patient Re-evaluated:Patient Re-evaluated prior to induction Oxygen Delivery Method: Circle system utilized Preoxygenation: Pre-oxygenation with 100% oxygen Induction Type: IV induction Ventilation: Mask ventilation without difficulty LMA: LMA inserted LMA Size: 3.0 Number of attempts: 1 Placement Confirmation: positive ETCO2,  CO2 detector and breath sounds checked- equal and bilateral Tube secured with: Tape Dental Injury: Teeth and Oropharynx as per pre-operative assessment        

## 2017-10-03 ENCOUNTER — Encounter (HOSPITAL_BASED_OUTPATIENT_CLINIC_OR_DEPARTMENT_OTHER): Payer: Self-pay | Admitting: Surgery

## 2017-10-03 NOTE — Anesthesia Postprocedure Evaluation (Signed)
Anesthesia Post Note  Patient: Wanda Burns  Procedure(s) Performed: EXCISION OF SUBCUTANEOUS LIPOMA OF RIGHT UPPER LATERAL ARM (Right Arm Upper)     Patient location during evaluation: PACU Anesthesia Type: General Level of consciousness: awake and alert Pain management: pain level controlled Vital Signs Assessment: post-procedure vital signs reviewed and stable Respiratory status: spontaneous breathing, nonlabored ventilation and respiratory function stable Cardiovascular status: blood pressure returned to baseline and stable Postop Assessment: no apparent nausea or vomiting Anesthetic complications: no    Last Vitals:  Vitals:   10/02/17 0830 10/02/17 0848  BP: 126/77 (!) 140/91  Pulse: 79 62  Resp: 13 16  Temp:  36.4 C  SpO2: 96% 100%    Last Pain:  Vitals:   10/02/17 0848  TempSrc:   PainSc: 4                  Catalina Gravel

## 2017-12-04 ENCOUNTER — Encounter (INDEPENDENT_AMBULATORY_CARE_PROVIDER_SITE_OTHER): Payer: Self-pay

## 2017-12-07 ENCOUNTER — Encounter (INDEPENDENT_AMBULATORY_CARE_PROVIDER_SITE_OTHER): Payer: Self-pay | Admitting: Family Medicine

## 2017-12-08 NOTE — Telephone Encounter (Signed)
Thank you, Wanda Burns

## 2017-12-09 ENCOUNTER — Ambulatory Visit (INDEPENDENT_AMBULATORY_CARE_PROVIDER_SITE_OTHER): Payer: PPO | Admitting: Family Medicine

## 2017-12-09 ENCOUNTER — Encounter (INDEPENDENT_AMBULATORY_CARE_PROVIDER_SITE_OTHER): Payer: Self-pay | Admitting: Family Medicine

## 2017-12-09 VITALS — BP 111/74 | HR 68 | Temp 98.0°F | Ht 64.0 in | Wt 194.0 lb

## 2017-12-09 DIAGNOSIS — E669 Obesity, unspecified: Secondary | ICD-10-CM | POA: Diagnosis not present

## 2017-12-09 DIAGNOSIS — Z6833 Body mass index (BMI) 33.0-33.9, adult: Secondary | ICD-10-CM | POA: Diagnosis not present

## 2017-12-09 DIAGNOSIS — Z1331 Encounter for screening for depression: Secondary | ICD-10-CM | POA: Diagnosis not present

## 2017-12-09 DIAGNOSIS — Z0289 Encounter for other administrative examinations: Secondary | ICD-10-CM

## 2017-12-09 DIAGNOSIS — E559 Vitamin D deficiency, unspecified: Secondary | ICD-10-CM

## 2017-12-09 DIAGNOSIS — R0602 Shortness of breath: Secondary | ICD-10-CM | POA: Diagnosis not present

## 2017-12-09 DIAGNOSIS — R5383 Other fatigue: Secondary | ICD-10-CM | POA: Insufficient documentation

## 2017-12-09 DIAGNOSIS — E7849 Other hyperlipidemia: Secondary | ICD-10-CM | POA: Insufficient documentation

## 2017-12-09 DIAGNOSIS — Z9189 Other specified personal risk factors, not elsewhere classified: Secondary | ICD-10-CM

## 2017-12-09 DIAGNOSIS — R739 Hyperglycemia, unspecified: Secondary | ICD-10-CM

## 2017-12-09 NOTE — Progress Notes (Signed)
Office: [phone]  /  Fax: [phone]   Dear Dr. Redmond Burns,   Thank you for referring Wanda Burns to our clinic. The following note includes my evaluation and treatment recommendations.  HPI:   Chief Complaint: OBESITY    Wanda Burns has been referred by Wanda Burns. Wanda Pulling, MD for consultation regarding her obesity and obesity related comorbidities.    Wanda Burns (MR# [id number]) is a 67 y.o. female who presents on 12/09/2017 for obesity evaluation and treatment. Current BMI is Body mass index is 33.3 kg/m.Marland Kitchen Wanda Burns has been struggling with her weight for many years and has been unsuccessful in either losing weight, maintaining weight loss, or reaching her healthy weight goal.     Wanda Burns attended our information session and states she is currently in the action stage of change and ready to dedicate time achieving and maintaining a healthier weight. Wanda Burns is interested in becoming our Wanda Burns and working on intensive lifestyle modifications including (but not limited to) diet, exercise and weight loss.    Traci states her desired weight loss is 50 to 55 lbs she started gaining weight at age 31 her heaviest weight ever was 200 lbs. she is a picky eater and doesn't like to eat healthier foods  she has significant food cravings issues  she snacks frequently in the evenings she skips meals frequently she is frequently drinking liquids with calories she has problems with excessive hunger  she has binge eating behaviors she struggles with emotional eating    Fatigue Wanda Burns feels her energy is lower than it should be. This has worsened with weight gain and has not worsened recently. Wanda Burns admits to daytime somnolence and admits to waking up still tired. Wanda Burns is at risk for obstructive sleep apnea. Patent has a history of symptoms of daytime fatigue and morning fatigue. Wanda Burns generally gets 7 or 8 hours of sleep per night, and states they generally have restful sleep. Snoring is not  present. Apneic episodes are not present. Epworth Sleepiness Score is 3  Dyspnea on exertion [name] notes increasing shortness of breath with exercising and seems to be worsening over time with weight gain. She notes getting out of breath sooner with activity than she used to. This has not gotten worse recently. [name] denies orthopnea.  Hyperglycemia Wanda Burns has multiple elevated fasting BGs in Epic from [place] without a diagnosis of diabetes or pre-diabetes, per Wanda Burns. She admits to polyphagia and polyuria.  Vitamin D deficiency Wanda Burns has a diagnosis of vitamin D deficiency. Wanda Burns is not currently taking vit D and she admits to fatigue, but she denies nausea, vomiting or muscle weakness.  Hyperlipidemia Wanda Burns has hyperlipidemia and she is not on statin and she is firm in not wanting to be on medications, but she is still worried about it. She has a history of elevated LDL. Wanda Burns is attempting to improve her cholesterol levels with intensive lifestyle modification including a low saturated fat diet, exercise and weight loss.  She denies any chest pain, claudication or myalgias.  At risk for cardiovascular disease Wanda Burns is at a higher than average risk for cardiovascular disease due to obesity and hyperlipidemia. She currently denies any chest pain.  Positive Depression Screen Wanda Burns is retired and she lives alone. She has a history of sneaking food due to embarrassment and feelings of guilt when she eats. Wanda Burns "gives up" on healthy eating, but she is unsure if she has an all or nothing personality. Wanda Burns has a PHQ-9 score of 15.  Wanda Burns  Food and Mood (modified PHQ-9) score was  Depression screen PHQ 2/9 12/09/2017  Decreased Interest 2  Down, Depressed, Hopeless 2  PHQ - 2 Score 4  Altered sleeping 1  Tired, decreased energy 3  Change in appetite 1  Feeling bad or failure about yourself  3  Trouble concentrating 3  Moving slowly or fidgety/restless 0  Suicidal thoughts 0  PHQ-9 Score  15  Difficult doing work/chores Somewhat difficult    ALLERGIES: Allergies  Allergen Reactions  . Codeine Itching  . Prednisone Other (See Comments)    Hyper, irritable    MEDICATIONS: Current Outpatient Medications on File Prior to Visit  Medication Sig Dispense Refill  . buPROPion (WELLBUTRIN XL) 300 MG 24 hr tablet Take 300 mg by mouth daily.    Marland Kitchen LORazepam (ATIVAN) 1 MG tablet Take 0.5 mg by mouth 2 (two) times daily as needed.     Marland Kitchen PARoxetine (PAXIL) 10 MG tablet Take 10 mg by mouth daily.     No current facility-administered medications on file prior to visit.     PAST MEDICAL HISTORY: Past Medical History:  Diagnosis Date  . Anxiety   . Arthritis    fingers  . Chest pain   . Constipation   . Depression   . Diverticulosis    mild on colonoscopy 2012  . Dyspnea on exertion   . Hay fever   . History of chicken pox   . History of smoking    quit 11/2010  . HLD (hyperlipidemia)   . Lactose intolerance   . Leg edema   . Obesity   . Rheumatoid arthritis ([place])   . Swallowing difficulty     PAST SURGICAL HISTORY: Past Surgical History:  Procedure Laterality Date  . abd [place]  2006   nothing acute, no gallbladder stones  . bilateral tubal    . BUNIONECTOMY Right   . COLONOSCOPY  2012   WNL, mild diverticulosis, rpt 10 yrs  . LIPOMA EXCISION    . LIPOMA EXCISION Right 10/02/2017   Procedure: EXCISION OF SUBCUTANEOUS LIPOMA OF RIGHT UPPER LATERAL ARM;  Surgeon: Donnie Mesa, MD;  Location: [facility];  Service: General;  Laterality: Right;  . TUBAL LIGATION    . VAGINAL HYSTERECTOMY  1990    SOCIAL HISTORY: Social History   Tobacco Use  . Smoking status: Former Smoker    Packs/day: 1.00    Types: Cigarettes    Last attempt to quit: 11/10/2010    Years since quitting: 7.0  . Smokeless tobacco: Never Used  . Tobacco comment: Less than 1/2 PPD  Substance Use Topics  . Alcohol use: Yes    Alcohol/week: 1.0 standard drinks    Types: 1  Standard drinks or equivalent per week    Comment: social  . Drug use: No    FAMILY HISTORY: Family History  Problem Relation Age of Onset  . Diabetes Mother   . Hypertension Mother   . Emphysema Mother   . Coronary artery disease Mother 17  . Hyperlipidemia Mother   . Depression Mother   . Anxiety disorder Mother   . Alcohol abuse Mother   . Obesity Mother   . Coronary artery disease Father 52       MI death  . Alcohol abuse Father   . Sudden death Father   . Diabetes Brother   . Diabetes Maternal Aunt   . Colon cancer Maternal Aunt   . Colon cancer Maternal Uncle 61  . Diabetes  Maternal Uncle   . Stroke Maternal Grandmother     ROS: Review of Systems  Constitutional: Positive for malaise/fatigue.  HENT: Positive for ear discharge, ear pain and tinnitus.        + Nasal Discharge + Hay Fever + Dentures + Dry Mouth  Eyes:       + Vision Changes +Blurry or Double Vision + Floaters  Respiratory: Positive for cough, shortness of breath and wheezing.        + Painful or Difficulty Breathing  Cardiovascular: Negative for chest pain, orthopnea and claudication.       + Shortness of Breath with Activity + Chest Pain/Discomfort  Gastrointestinal: Positive for constipation, diarrhea, heartburn and nausea. Negative for vomiting.       + Swallowing Difficulty  Genitourinary: Positive for frequency.  Musculoskeletal: Negative for myalgias.       + Neck Stiffness Negative for muscle weakness  Skin:       + Dryness + Hair or Nail Changes  Endo/Heme/Allergies:       + Polyphagia   Psychiatric/Behavioral: Positive for depression. The Wanda Burns is nervous/anxious (nervousness) and has insomnia.        + Stress    PHYSICAL EXAM: Blood pressure 111/74, pulse 68, temperature 98 F (36.7 C), temperature source Oral, height 5\' 4"  (1.626 m), weight 194 lb (88 kg), SpO2 96 %. Body mass index is 33.3 kg/m. Physical Exam  Constitutional: She is oriented to person, place, and  time. She appears well-developed and well-nourished.  HENT:  Head: Normocephalic and atraumatic.  Nose: Nose normal.  Eyes: EOM are normal. No scleral icterus.  Neck: Normal range of motion. Neck supple. No thyromegaly present.  Cardiovascular: Normal rate and regular rhythm.  Pulmonary/Chest: Effort normal. No respiratory distress.  Abdominal: Soft. There is no tenderness.  + Obesity  Musculoskeletal: Normal range of motion.  Range of Motion normal in all 4 extremities  Neurological: She is alert and oriented to person, place, and time. Coordination normal.  Skin: Skin is warm and dry.  Psychiatric: She has a normal mood and affect. Her behavior is normal.  Vitals reviewed.   RECENT LABS AND TESTS: BMET    Component Value Date/Time   NA 140 09/04/2010 0840   K 4.0 09/04/2010 0840   CL 107 09/04/2010 0840   CO2 28 09/04/2010 0840   GLUCOSE 83 09/04/2010 0840   BUN 11 09/04/2010 0840   CREATININE 0.8 09/04/2010 0840   CALCIUM 9.1 09/04/2010 0840   No results found for: HGBA1C No results found for: INSULIN CBC    Component Value Date/Time   WBC 8.8 09/04/2010 0840   RBC 4.45 09/04/2010 0840   HGB 14.0 09/04/2010 0840   HCT 41.3 09/04/2010 0840   PLT 271.0 09/04/2010 0840   MCV 92.9 09/04/2010 0840   MCHC 33.9 09/04/2010 0840   RDW 13.0 09/04/2010 0840   LYMPHSABS 3.1 09/04/2010 0840   MONOABS 0.5 09/04/2010 0840   EOSABS 0.1 09/04/2010 0840   BASOSABS 0.0 09/04/2010 0840   Iron/TIBC/Ferritin/ %Sat No results found for: IRON, TIBC, FERRITIN, IRONPCTSAT Lipid Panel     Component Value Date/Time   CHOL 222 (H) 09/04/2010 0840   TRIG 134.0 09/04/2010 0840   HDL 38.60 (L) 09/04/2010 0840   CHOLHDL 6 09/04/2010 0840   VLDL 26.8 09/04/2010 0840   LDLDIRECT 146.2 09/04/2010 0840   Hepatic Function Panel     Component Value Date/Time   PROT 6.6 09/04/2010 0840   ALBUMIN 4.4 09/04/2010 0840  AST 19 09/04/2010 0840   ALT 20 09/04/2010 0840   ALKPHOS 121 (H)  09/04/2010 0840   BILITOT 0.3 09/04/2010 0840      Component Value Date/Time   TSH 1.38 09/04/2010 0840    ECG  shows NSR with a rate of 63 BPM INDIRECT CALORIMETER done today shows a VO2 of 176 and a REE of 1224.  Her calculated basal metabolic rate is 1324 thus her basal metabolic rate is worse than expected.    ASSESSMENT AND PLAN: Other fatigue - Plan: EKG 12-Lead  Shortness of breath on exertion  Hyperglycemia  Vitamin D deficiency  Other hyperlipidemia  Positive depression screening  At risk for heart disease  Class 1 obesity with serious comorbidity and body mass index (BMI) of 33.0 to 33.9 in adult, unspecified obesity type  PLAN: Fatigue Wanda Burns was informed that her fatigue may be related to obesity, depression or many other causes. Labs will be ordered, and in the meanwhile Wanda Burns has agreed to work on diet, exercise and weight loss to help with fatigue. Proper sleep hygiene was discussed including the need for 7-8 hours of quality sleep each night. A sleep study was not ordered based on symptoms and Epworth score.  Dyspnea on exertion Wanda Burns's shortness of breath appears to be obesity related and exercise induced. She has agreed to work on weight loss and gradually increase exercise to treat her exercise induced shortness of breath. If Wanda Burns follows our instructions and loses weight without improvement of her shortness of breath, we will plan to refer to pulmonology. We will monitor this condition regularly. Samyukta agrees to this plan.  Hyperglycemia Fasting labs will be obtained and results with be discussed with Wanda Burns in 2 weeks at her follow up visit. In the meanwhile Arrie was started on a lower simple carbohydrate diet and will work on weight loss efforts.  Vitamin D Deficiency Wanda Burns was informed that low vitamin D levels contributes to fatigue and are associated with obesity, breast, and colon cancer. We will check labs and follow. She will follow up for routine  testing of vitamin D, at least 2-3 times per year.   Hyperlipidemia Rilynn was informed of the American Heart Association Guidelines emphasizing intensive lifestyle modifications as the first line treatment for hyperlipidemia. We discussed many lifestyle modifications today in depth, and Joycelin will start diet and work on decreasing saturated fats such as fatty red meat, butter and many fried foods. She will also increase vegetables and lean protein in her diet and continue to work on exercise and weight loss efforts. We will check labs and follow.  Cardiovascular risk counseling Cristiana was given extended (15 minutes) coronary artery disease prevention counseling today. She is 67 y.o. female and has risk factors for heart disease including obesity and hyperlipidemia. We discussed intensive lifestyle modifications today with an emphasis on specific weight loss instructions and strategies. Pt was also informed of the importance of increasing exercise and decreasing saturated fats to help prevent heart disease.  Positive Depression Screen Finnlee had a strongly positive depression screening. Depression is commonly associated with obesity and often results in emotional eating behaviors. We will monitor this closely and work on CBT to help improve the non-hunger eating patterns. We will refer to Dr. Mallie Mussel, our bariatric psychologist for evaluation and therapy.Obesity Alla is currently in the action stage of change and her goal is to continue with weight loss efforts. I recommend Lashina begin the structured treatment plan as follows:  She has agreed to  follow the Category 1 plan Daelyn has been instructed to eventually work up to a goal of 150 minutes of combined cardio and strengthening exercise per week for weight loss and overall health benefits. We discussed the following Behavioral Modification Strategies today: increasing lean protein intake, decreasing simple carbohydrates  and work on meal planning and  easy cooking plans   She was informed of the importance of frequent follow up visits to maximize her success with intensive lifestyle modifications for her multiple health conditions. She was informed we would discuss her lab results at her next visit unless there is a critical issue that needs to be addressed sooner. Dashonna agreed to keep her next visit at the agreed upon time to discuss these results.    OBESITY BEHAVIORAL INTERVENTION VISIT  Today's visit was # 1   Starting weight: 194 lbs Starting date: 12/09/17 Today's weight : 194 lbs  Today's date: 12/09/2017 Total lbs lost to date: 0   ASK: We discussed the diagnosis of obesity with Simonne Martinet today and Tariyah agreed to give [place] permission to discuss obesity behavioral modification therapy today.  ASSESS: Neko has the diagnosis of obesity and her BMI today is 33.28 Ayra is in the action stage of change   ADVISE: Jisel was educated on the multiple health risks of obesity as well as the benefit of weight loss to improve her health. She was advised of the need for long term treatment and the importance of lifestyle modifications to improve her current health and to decrease her risk of future health problems.  AGREE: Multiple dietary modification options and treatment options were discussed and  Tifanny agreed to follow the recommendations documented in the above note.  ARRANGE: Tammee was educated on the importance of frequent visits to treat obesity as outlined per CMS and USPSTF guidelines and agreed to schedule her next follow up appointment today.  I, Doreene Nest, am acting as transcriptionist for Dennard Nip, MD  I have reviewed the above documentation for accuracy and completeness, and I agree with the above. -Dennard Nip, MD

## 2017-12-10 LAB — LIPID PANEL WITH LDL/HDL RATIO
CHOLESTEROL TOTAL: 222 mg/dL — AB (ref 100–199)
HDL: 54 mg/dL (ref >39)
LDL CALC: 148 mg/dL — AB (ref 0–99)
LDL/HDL RATIO: 2.7 ratio (ref 0.0–3.2)
Triglycerides: 101 mg/dL (ref 0–149)
VLDL Cholesterol Cal: 20 mg/dL (ref 5–40)

## 2017-12-10 LAB — CBC WITH DIFFERENTIAL
BASOS: 1 %
Basophils Absolute: 0 10*3/uL (ref 0.0–0.2)
EOS (ABSOLUTE): 0 10*3/uL (ref 0.0–0.4)
EOS: 1 %
HEMATOCRIT: 39.7 % (ref 34.0–46.6)
HEMOGLOBIN: 12.8 g/dL (ref 11.1–15.9)
Immature Grans (Abs): 0 10*3/uL (ref 0.0–0.1)
Immature Granulocytes: 0 %
Lymphocytes Absolute: 1.5 10*3/uL (ref 0.7–3.1)
Lymphs: 26 %
MCH: 26.1 pg — AB (ref 26.6–33.0)
MCHC: 32.2 g/dL (ref 31.5–35.7)
MCV: 81 fL (ref 79–97)
MONOCYTES: 6 %
Monocytes Absolute: 0.4 10*3/uL (ref 0.1–0.9)
NEUTROS ABS: 3.9 10*3/uL (ref 1.4–7.0)
Neutrophils: 66 %
RBC: 4.91 x10E6/uL (ref 3.77–5.28)
RDW: 16.7 % — ABNORMAL HIGH (ref 12.3–15.4)
WBC: 5.8 10*3/uL (ref 3.4–10.8)

## 2017-12-10 LAB — COMPREHENSIVE METABOLIC PANEL
ALBUMIN: 4.4 g/dL (ref 3.6–4.8)
ALT: 19 IU/L (ref 0–32)
AST: 18 IU/L (ref 0–40)
Albumin/Globulin Ratio: 1.9 (ref 1.2–2.2)
Alkaline Phosphatase: 141 IU/L — ABNORMAL HIGH (ref 39–117)
BUN / CREAT RATIO: 22 (ref 12–28)
BUN: 19 mg/dL (ref 8–27)
CO2: 21 mmol/L (ref 20–29)
CREATININE: 0.88 mg/dL (ref 0.57–1.00)
Calcium: 8.9 mg/dL (ref 8.7–10.3)
Chloride: 101 mmol/L (ref 96–106)
GFR calc Af Amer: 80 mL/min/{1.73_m2} (ref >59)
GFR calc non Af Amer: 70 mL/min/{1.73_m2} (ref >59)
GLOBULIN, TOTAL: 2.3 g/dL (ref 1.5–4.5)
Glucose: 76 mg/dL (ref 65–99)
Potassium: 4.6 mmol/L (ref 3.5–5.2)
SODIUM: 140 mmol/L (ref 134–144)
Total Protein: 6.7 g/dL (ref 6.0–8.5)

## 2017-12-10 LAB — FOLATE: FOLATE: 5.7 ng/mL (ref >3.0)

## 2017-12-10 LAB — HEMOGLOBIN A1C
Est. average glucose Bld gHb Est-mCnc: 131 mg/dL
HEMOGLOBIN A1C: 6.2 % — AB (ref 4.8–5.6)

## 2017-12-10 LAB — T4, FREE: Free T4: 1 ng/dL (ref 0.82–1.77)

## 2017-12-10 LAB — TSH: TSH: 2.5 u[IU]/mL (ref 0.450–4.500)

## 2017-12-10 LAB — T3: T3, Total: 95 ng/dL (ref 71–180)

## 2017-12-10 LAB — VITAMIN D 25 HYDROXY (VIT D DEFICIENCY, FRACTURES): Vit D, 25-Hydroxy: 37.6 ng/mL (ref 30.0–100.0)

## 2017-12-10 LAB — VITAMIN B12: Vitamin B-12: 391 pg/mL (ref 232–1245)

## 2017-12-10 LAB — INSULIN, RANDOM: INSULIN: 14.7 u[IU]/mL (ref 2.6–24.9)

## 2017-12-11 ENCOUNTER — Ambulatory Visit (INDEPENDENT_AMBULATORY_CARE_PROVIDER_SITE_OTHER): Payer: Self-pay | Admitting: Family Medicine

## 2017-12-24 ENCOUNTER — Ambulatory Visit (INDEPENDENT_AMBULATORY_CARE_PROVIDER_SITE_OTHER): Payer: PPO | Admitting: Family Medicine

## 2017-12-24 ENCOUNTER — Ambulatory Visit (INDEPENDENT_AMBULATORY_CARE_PROVIDER_SITE_OTHER): Payer: PPO | Admitting: Psychology

## 2017-12-24 VITALS — BP 109/72 | HR 71 | Temp 97.3°F | Ht 64.0 in | Wt 195.0 lb

## 2017-12-24 DIAGNOSIS — Z9189 Other specified personal risk factors, not elsewhere classified: Secondary | ICD-10-CM | POA: Diagnosis not present

## 2017-12-24 DIAGNOSIS — E7849 Other hyperlipidemia: Secondary | ICD-10-CM

## 2017-12-24 DIAGNOSIS — E669 Obesity, unspecified: Secondary | ICD-10-CM | POA: Diagnosis not present

## 2017-12-24 DIAGNOSIS — F331 Major depressive disorder, recurrent, moderate: Secondary | ICD-10-CM

## 2017-12-24 DIAGNOSIS — Z6833 Body mass index (BMI) 33.0-33.9, adult: Secondary | ICD-10-CM | POA: Diagnosis not present

## 2017-12-24 DIAGNOSIS — E559 Vitamin D deficiency, unspecified: Secondary | ICD-10-CM | POA: Diagnosis not present

## 2017-12-24 DIAGNOSIS — R7303 Prediabetes: Secondary | ICD-10-CM

## 2017-12-24 MED ORDER — METFORMIN HCL 500 MG PO TABS: 500.0000 mg | ORAL_TABLET | Freq: Every day | ORAL | 0 refills | Status: DC

## 2017-12-24 MED ORDER — VITAMIN D (ERGOCALCIFEROL) 1.25 MG (50000 UNIT) PO CAPS: 50000.0000 [IU] | ORAL_CAPSULE | ORAL | 0 refills | Status: DC
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
## 2017-12-25 NOTE — Progress Notes (Signed)
Office: [phone]  /  Fax: [phone]   HPI:   Chief Complaint: OBESITY Wanda Burns is here to discuss her progress with her obesity treatment plan. She is on the Category 1 plan and is following her eating plan approximately 60 % of the time. She states she is exercising 0 minutes 0 times per week. Gladine did well on her Category 1 when she followed it, but often didn't eat all her food. She struggled to limit her oil intake.  Her weight is 195 lb (88.5 kg) today and has gained 1 pound since her last visit. She has lost 0 lbs since starting treatment with [place].  Hyperlipidemia Wanda Burns has hyperlipidemia and would like to try to control her cholesterol levels with intensive lifestyle modification including a low saturated fat diet, exercise and weight loss. LDL is elevated and she is not on statin. She denies any chest pain, claudication or myalgias.  Vitamin D Deficiency Wanda Burns has a new diagnosis of vitamin D deficiency. She is not on Vit D, she notes fatigue and denies nausea, vomiting or muscle weakness.  Pre-Diabetes Wanda Burns has a diagnosis of pre-diabetes based on her elevated Hgb A1c and was informed this puts her at greater risk of developing diabetes. She is struggling to follow her diet prescription. She notes polyphagia, but this seemed to have improved on her diet prescription. She is not taking metformin currently and continues to work on diet and exercise to decrease risk of diabetes. She denies hypoglycemia.  At risk for diabetes Wanda Burns is at higher than average risk for developing diabetes due to her obesity and pre-diabetes. She currently denies polyuria or polydipsia.  ALLERGIES: Allergies  Allergen Reactions  . Codeine Itching  . Prednisone Other (See Comments)    Hyper, irritable    MEDICATIONS: Current Outpatient Medications on File Prior to Visit  Medication Sig Dispense Refill  . buPROPion (WELLBUTRIN XL) 300 MG 24 hr tablet Take 300 mg by mouth daily.    Marland Kitchen  LORazepam (ATIVAN) 1 MG tablet Take 0.5 mg by mouth 2 (two) times daily as needed.     Marland Kitchen PARoxetine (PAXIL) 10 MG tablet Take 10 mg by mouth daily.     No current facility-administered medications on file prior to visit.     PAST MEDICAL HISTORY: Past Medical History:  Diagnosis Date  . Anxiety   . Arthritis    fingers  . Chest pain   . Constipation   . Depression   . Diverticulosis    mild on colonoscopy 2012  . Dyspnea on exertion   . Hay fever   . History of chicken pox   . History of smoking    quit 11/2010  . HLD (hyperlipidemia)   . Lactose intolerance   . Leg edema   . Obesity   . Rheumatoid arthritis ([place])   . Swallowing difficulty     PAST SURGICAL HISTORY: Past Surgical History:  Procedure Laterality Date  . abd [place]  2006   nothing acute, no gallbladder stones  . bilateral tubal    . BUNIONECTOMY Right   . COLONOSCOPY  2012   WNL, mild diverticulosis, rpt 10 yrs  . LIPOMA EXCISION    . LIPOMA EXCISION Right 10/02/2017   Procedure: EXCISION OF SUBCUTANEOUS LIPOMA OF RIGHT UPPER LATERAL ARM;  Surgeon: Donnie Mesa, MD;  Location: [facility];  Service: General;  Laterality: Right;  . TUBAL LIGATION    . VAGINAL HYSTERECTOMY  1990    SOCIAL HISTORY: Social  History   Tobacco Use  . Smoking status: Former Smoker    Packs/day: 1.00    Types: Cigarettes    Last attempt to quit: 11/10/2010    Years since quitting: 7.1  . Smokeless tobacco: Never Used  . Tobacco comment: Less than 1/2 PPD  Substance Use Topics  . Alcohol use: Yes    Alcohol/week: 1.0 standard drinks    Types: 1 Standard drinks or equivalent per week    Comment: social  . Drug use: No    FAMILY HISTORY: Family History  Problem Relation Age of Onset  . Diabetes Mother   . Hypertension Mother   . Emphysema Mother   . Coronary artery disease Mother 86  . Hyperlipidemia Mother   . Depression Mother   . Anxiety disorder Mother   . Alcohol abuse Mother   . Obesity  Mother   . Coronary artery disease Father 46       MI death  . Alcohol abuse Father   . Sudden death Father   . Diabetes Brother   . Diabetes Maternal Aunt   . Colon cancer Maternal Aunt   . Colon cancer Maternal Uncle 52  . Diabetes Maternal Uncle   . Stroke Maternal Grandmother     ROS: Review of Systems  Constitutional: Positive for malaise/fatigue. Negative for weight loss.  Cardiovascular: Negative for chest pain and claudication.  Gastrointestinal: Negative for nausea and vomiting.  Genitourinary: Negative for frequency.  Musculoskeletal: Negative for myalgias.       Negative muscle weakness  Endo/Heme/Allergies: Negative for polydipsia.       Positive polyphagia Negative hypoglycemia    PHYSICAL EXAM: Blood pressure 109/72, pulse 71, temperature (!) 97.3 F (36.3 C), temperature source Oral, height 5\' 4"  (1.626 m), weight 195 lb (88.5 kg), SpO2 97 %. Body mass index is 33.47 kg/m. Physical Exam  Constitutional: She is oriented to person, place, and time. She appears well-developed and well-nourished.  Cardiovascular: Normal rate.  Pulmonary/Chest: Effort normal.  Musculoskeletal: Normal range of motion.  Neurological: She is oriented to person, place, and time.  Skin: Skin is warm and dry.  Psychiatric: She has a normal mood and affect. Her behavior is normal.  Vitals reviewed.   RECENT LABS AND TESTS: BMET    Component Value Date/Time   NA 140 12/09/2017 1407   K 4.6 12/09/2017 1407   CL 101 12/09/2017 1407   CO2 21 12/09/2017 1407   GLUCOSE 76 12/09/2017 1407   GLUCOSE 83 09/04/2010 0840   BUN 19 12/09/2017 1407   CREATININE 0.88 12/09/2017 1407   CALCIUM 8.9 12/09/2017 1407   GFRNONAA 70 12/09/2017 1407   GFRAA 80 12/09/2017 1407   Lab Results  Component Value Date   HGBA1C 6.2 (H) 12/09/2017   Lab Results  Component Value Date   INSULIN 14.7 12/09/2017   CBC    Component Value Date/Time   WBC 5.8 12/09/2017 1407   WBC 8.8 09/04/2010  0840   RBC 4.91 12/09/2017 1407   RBC 4.45 09/04/2010 0840   HGB 12.8 12/09/2017 1407   HCT 39.7 12/09/2017 1407   PLT 271.0 09/04/2010 0840   MCV 81 12/09/2017 1407   MCH 26.1 (L) 12/09/2017 1407   MCHC 32.2 12/09/2017 1407   MCHC 33.9 09/04/2010 0840   RDW 16.7 (H) 12/09/2017 1407   LYMPHSABS 1.5 12/09/2017 1407   MONOABS 0.5 09/04/2010 0840   EOSABS 0.0 12/09/2017 1407   BASOSABS 0.0 12/09/2017 1407   Iron/TIBC/Ferritin/ %Sat No  results found for: IRON, TIBC, FERRITIN, IRONPCTSAT Lipid Panel     Component Value Date/Time   CHOL 222 (H) 12/09/2017 1407   TRIG 101 12/09/2017 1407   HDL 54 12/09/2017 1407   CHOLHDL 6 09/04/2010 0840   VLDL 26.8 09/04/2010 0840   LDLCALC 148 (H) 12/09/2017 1407   LDLDIRECT 146.2 09/04/2010 0840   Hepatic Function Panel     Component Value Date/Time   PROT 6.7 12/09/2017 1407   ALBUMIN 4.4 12/09/2017 1407   AST 18 12/09/2017 1407   ALT 19 12/09/2017 1407   ALKPHOS 141 (H) 12/09/2017 1407   BILITOT <0.2 12/09/2017 1407      Component Value Date/Time   TSH 2.500 12/09/2017 1407   TSH 1.38 09/04/2010 0840  Results for ALYSSABETH, BRUSTER (MRN [id number]) as of 12/25/2017 15:33  Ref. Range 12/09/2017 14:07  Vitamin D, 25-Hydroxy Latest Ref Range: 30.0 - 100.0 ng/mL 37.6    ASSESSMENT AND PLAN: Vitamin D deficiency - Plan: Vitamin D, Ergocalciferol, (DRISDOL) 50000 units CAPS capsule  Other hyperlipidemia  Prediabetes - Plan: metFORMIN (GLUCOPHAGE) 500 MG tablet  At risk for diabetes mellitus  Class 1 obesity with serious comorbidity and body mass index (BMI) of 33.0 to 33.9 in adult, unspecified obesity type  PLAN:  Hyperlipidemia Wanda Burns was informed of the American Heart Association Guidelines emphasizing intensive lifestyle modifications as the first line treatment for hyperlipidemia. We discussed many lifestyle modifications today in depth, and Wanda Burns will continue to work on decreasing saturated fats such as fatty red meat, butter  and many fried foods. She will also increase vegetables and lean protein in her diet and continue to work on diet, exercise, and weight loss efforts. Wanda Burns agrees to follow up with our clinic in 2 weeks.  Vitamin D Deficiency Wanda Burns was informed that low vitamin D levels contributes to fatigue and are associated with obesity, breast, and colon cancer. Wanda Burns agrees to start prescription Vit D @50 ,000 IU every week #4 with no refills. She will follow up for routine testing of vitamin D, at least 2-3 times per year. She was informed of the risk of over-replacement of vitamin D and agrees to not increase her dose unless she discusses this with [place] first. We will recheck labs in 3 months. Wanda Burns agrees to follow up with our clinic in 2 weeks.  Pre-Diabetes Wanda Burns will continue to work on weight loss, exercise, and decreasing simple carbohydrates in her diet to help decrease the risk of diabetes. We dicussed metformin including benefits and risks. She was informed that eating too many simple carbohydrates or too many calories at one sitting increases the likelihood of GI side effects. Wanda Burns agrees to start metformin 500 mg q AM #30 with no refills. Wanda Burns agrees to follow up with our clinic in 2 weeks as directed to monitor her progress.  Diabetes risk counselling Wanda Burns was given extended (30 minutes) diabetes prevention counseling today. She is 67 y.o. female and has risk factors for diabetes including obesity and pre-diabetes. We discussed intensive lifestyle modifications today with an emphasis on weight loss as well as increasing exercise and decreasing simple carbohydrates in her diet.  Obesity Wanda Burns is currently in the action stage of change. As such, her goal is to continue with weight loss efforts She has agreed to follow the Category 1 plan Wanda Burns has been instructed to work up to a goal of 150 minutes of combined cardio and strengthening exercise per week for weight loss and overall health benefits. We  discussed  the following Behavioral Modification Strategies today: increasing lean protein intake, decreasing simple carbohydrates, work on meal planning and easy cooking plans and emotional eating strategies   Wanda Burns has agreed to follow up with our clinic in 2 weeks. She was informed of the importance of frequent follow up visits to maximize her success with intensive lifestyle modifications for her multiple health conditions.   OBESITY BEHAVIORAL INTERVENTION VISIT  Today's visit was # 2   Starting weight: 194 lbs Starting date: 12/09/17 Today's weight : 195 lbs Today's date: 12/24/2017 Total lbs lost to date: 0    ASK: We discussed the diagnosis of obesity with Wanda Burns today and Wanda Burns agreed to give [place] permission to discuss obesity behavioral modification therapy today.  ASSESS: Lamara has the diagnosis of obesity and her BMI today is 33.46 Laporscha is in the action stage of change   ADVISE: Ioanna was educated on the multiple health risks of obesity as well as the benefit of weight loss to improve her health. She was advised of the need for long term treatment and the importance of lifestyle modifications to improve her current health and to decrease her risk of future health problems.  AGREE: Multiple dietary modification options and treatment options were discussed and  Abella agreed to follow the recommendations documented in the above note.  ARRANGE: Heiress was educated on the importance of frequent visits to treat obesity as outlined per CMS and USPSTF guidelines and agreed to schedule her next follow up appointment today.  I, Trixie Dredge, am acting as transcriptionist for Dennard Nip, MD  I have reviewed the above documentation for accuracy and completeness, and I agree with the above. -Dennard Nip, MD

## 2018-01-05 NOTE — Progress Notes (Signed)
Office: [phone]  /  Fax: [phone]   Date: January 07, 2018 Time Seen: 8:25am Duration: 30 minutes Provider: Glennie Burns, Psy.D. Type of Session: Individual Therapy   HPI: Wanda Burns was referred by Dr. Dennard Burns due to positive depression screen. She was seen for an initial appointment by this provider on December 24, 2017. Per the note for the initial visit with Dr. Dennard Burns on December 09, 2017, "Wanda Burns is retired and she lives alone. She has a history of sneaking food due to embarrassment and feelings of guilt when she eats. Wanda Burns "gives up" on healthy eating, but she is unsure if she has an all or nothing personality." Wanda Burns's Food and Mood (modified PHQ-9) score was15. In addition, per the note for the initial visit with Dr. Dennard Burns on December 09, 2017, Wanda Burns described herself as a picky eater and does not like to eat healthier foods. Wanda Burns started gaining weight at age 35 and her heaviest weight ever was 200 pounds. During her initial appointment with Dr. Leafy Burns, Wanda Burns reported experiencing the following: significant food cravings issues; snacking frequently in the evenings; skipping meals frequently; frequently drinking liquids with calories; problems with excessive hunger; binge eating behaviors; and struggling with emotional eating. During the initial appointment with this provider, Wanda Burns reported experiencing difficulty "reducing" her weight, which "stems off" to medical difficulties (e.g., diabetes). She also reported her weight impacts her mood. Currently, Wanda Burns stated she takes two antidepressants, but is unsure if they are helping. She reported a history of experiencing emotional eating with the last episode being the week prior to the initial appointment with this provider.  Wanda Burns reported a history of binge eating, and added, "If I don't have it in the house, I won't do it." She could not recall the last time she engaged in binge eating. Wanda Burns denied a history of purging  and engagement in other compensatory strategies. She denied ever being diagnosed with an eating disorder. Moreover, Wanda Burns reported emotional eating started in childhood; however, she was skinny and did not start gaining weight until her 27s. Furthermore, Wanda Burns was asked to complete a questionnaire assessing various behaviors related to emotional eating. Wanda Burns endorsed the following: experience food cravings on a regular basis, eat certain foods when you are anxious, stressed, depressed, or your feelings are hurt, find food is comforting to you, overeat when you are angry or upset, not worry about what you eat when you are in a good mood, overeat when you are alone, but eat much less when you are with other people and eat as a reward.  Session Content: Session focused on the following treatment goal: decrease emotional eating. The session was initiated with the administration of the PHQ-9 and GAD-7, as well as a brief check-in. Wanda Burns reported she has been more active since the last appointment, as she is gardening.  She also indicated she recognizes that stressors with her brother are out of her control. Wanda Burns discussed continuing to experience difficulty with indoor chores, which she attributed to lack of accountability. Thus, the appointment focused on problem solving to assist Wanda Burns in establishing short-term goals. She discussed willingness to dedicate time to sorting her clothes and organizing them. She was also receptive to spending five minutes a day in her spare bedroom for organization purposes. Session then focused on emotional eating. Wanda Burns indicated that she is more compliant with the meal plan in comparison to a couple weeks ago. Nevertheless, she acknowledged she struggles with consuming all of the food on her meal  plan. As such, this provider and Wanda Burns focused on problem solving as it relates to increasing her protein consumption. She indicated a goal of buying more seafood, and was receptive to  discussing the aforementioned further during her appointment with Wanda Bathe, FNP-C at their appointment today. This provider also recommended utilizing the meal plan as a checklist to assist with compliance.  Regarding marijuana use, Wanda Burns indicated smoking "two bowls" of marijuana on one occasion since the last appointment with this provider. She indicated it was approximately half a gram of marijuana total. She denied driving under the influence or engaging in any risky behaviors. Wanda Burns noted that she did not engage in emotional eating and attributed that to the decrease in marijuana use. Furthermore, psychoeducation regarding triggers for emotional eating was provided. Wanda Burns was provided a handout, and encouraged to utilize the handout between now and the next appointment to increase awareness of triggers and frequency. Wanda Burns agreed. Overall, Wanda Burns was receptive to today's appointment as evidenced by her openness to sharing, responsiveness to feedback, willingness to engage in problem solving, and willingness to identify her triggers for emotional eating.  Mental Status Examination: Wanda Burns arrived early for the appointment; therefore, the appointment was initiated early. She presented as appropriately dressed and groomed. Wanda Burns appeared her stated age and demonstrated adequate orientation to time, place, person, and purpose of the appointment. She also demonstrated appropriate eye contact. No psychomotor abnormalities or behavioral peculiarities noted. Her mood was euthymic with congruent affect. Her thought processes were logical, linear, and goal-directed. No hallucinations, delusions, bizarre thinking or behavior reported or observed. Judgment, insight, and impulse control appeared to be grossly intact. There was no evidence of paraphasias (i.e., errors in speech, gross mispronunciations, and word substitutions), repetition deficits, or disturbances in volume or prosody (i.e., rhythm and intonation).  There was no evidence of attention or memory impairments. Wanda Burns denied current suicidal and homicidal ideation, intent or plan.  Structured Assessment Results: The Patient Health Questionnaire-9 (PHQ-9) is a self-report measure that assesses symptoms and severity of depression over the course of the last two weeks. Morgin obtained a score of eight suggesting mild depression. Wilbur finds the endorsed symptoms to be somewhat difficult. Depression screen PHQ 2/9 01/07/2018  Decreased Interest 1  Down, Depressed, Hopeless 2  PHQ - 2 Score 3  Altered sleeping 1  Tired, decreased energy 1  Change in appetite 1  Feeling bad or failure about yourself  1  Trouble concentrating 1  Moving slowly or fidgety/restless 0  Suicidal thoughts 0  PHQ-9 Score 8  Difficult doing work/chores -   The Generalized Anxiety Disorder-7 (GAD-7) is a brief self-report measure that assesses symptoms of anxiety over the course of the last two weeks. Kathyleen obtained a score of three suggesting minimal anxiety. GAD 7 : Generalized Anxiety Score 01/07/2018  Nervous, Anxious, on Edge 1  Control/stop worrying 1  Worry too much - different things 0  Trouble relaxing 0  Restless 0  Easily annoyed or irritable 0  Afraid - awful might happen 1  Total GAD 7 Score 3  Anxiety Difficulty Not difficult at all   Interventions: Erline was administered the PHQ-9 and GAD-7 for symptom monitoring. Content from the last session was reviewed. Throughout today's session, empathic reflections and validation were provided. This provider engage Kissie in problem solving to assist with indoor chores as well as consumption of all foods indicated on the meal plan, specifically protein. Psychoeducation regarding triggers for emotional eating was provided.  DSM-5 Diagnosis: 296.32 (F33.1)  Major Depressive Disorder, Recurrent Episode, Moderate  Treatment Goal & Progress: Diania was seen for an initial appointment with this provider on December 24, 2017 during which the following treatment goal was established: decrease emotional eating. Neviah has demonstrated progress in her goal of decreasing emotional eating as evidenced by increased awareness of physical versus emotional hunger. In addition, Espyn was receptive to identifying her triggers for emotional eating. Moreover, she acknowledged that her marijuana use has contributed to emotional eating.  Plan: Francenia continues to appear able and willing to participate as evidenced by engagement in reciprocal conversation, and asking questions for clarification as appropriate.The next appointment will be scheduled in two weeks. The next session will focus on reviewing triggers for emotional eating and the introduction of mindfulness. The next appointment will also focus on checking on Erian's success with established short-term goals.

## 2018-01-07 ENCOUNTER — Ambulatory Visit (INDEPENDENT_AMBULATORY_CARE_PROVIDER_SITE_OTHER): Payer: PPO | Admitting: Psychology

## 2018-01-07 ENCOUNTER — Ambulatory Visit (INDEPENDENT_AMBULATORY_CARE_PROVIDER_SITE_OTHER): Payer: PPO | Admitting: Family Medicine

## 2018-01-07 ENCOUNTER — Encounter (INDEPENDENT_AMBULATORY_CARE_PROVIDER_SITE_OTHER): Payer: Self-pay | Admitting: Family Medicine

## 2018-01-07 VITALS — BP 105/77 | HR 63 | Temp 98.1°F | Ht 64.0 in | Wt 191.0 lb

## 2018-01-07 DIAGNOSIS — E559 Vitamin D deficiency, unspecified: Secondary | ICD-10-CM

## 2018-01-07 DIAGNOSIS — F331 Major depressive disorder, recurrent, moderate: Secondary | ICD-10-CM

## 2018-01-07 DIAGNOSIS — F3289 Other specified depressive episodes: Secondary | ICD-10-CM

## 2018-01-07 DIAGNOSIS — R7303 Prediabetes: Secondary | ICD-10-CM

## 2018-01-07 DIAGNOSIS — Z6832 Body mass index (BMI) 32.0-32.9, adult: Secondary | ICD-10-CM

## 2018-01-07 DIAGNOSIS — E669 Obesity, unspecified: Secondary | ICD-10-CM

## 2018-01-07 DIAGNOSIS — K5909 Other constipation: Secondary | ICD-10-CM

## 2018-01-07 NOTE — Progress Notes (Signed)
Office: [phone]  /  Fax: [phone]   HPI:   Chief Complaint: OBESITY Wanda Burns is here to discuss her progress with her obesity treatment plan. She is following the Category 1 plan and is following her eating plan approximately 60 % of the time. She states she is doing yard work 60+ minutes 4 times per week. Wanda Burns is not currently getting all of her protein in at this time.  Her weight is 191 lb (86.6 kg) today and has had a weight loss of 4 pounds over a period of 2 weeks since her last visit. She has lost 4 lbs since starting treatment with [place].  Pre-Diabetes Wanda Burns has a diagnosis of prediabetes based on her elevated HgA1c and was informed this puts her at greater risk of developing diabetes. She is taking metformin currently and continues to work on diet and exercise to decrease risk of diabetes. She denies nausea, vomiting, diarrhea or hypoglycemia.  Vitamin D Deficiency Wanda Burns was informed that low vitamin D levels contributes to fatigue and are associated with obesity, breast, and colon cancer. She agrees to continue to take prescription Vit D @50 ,000 IU every week. Wanda Burns's fatigue has improved. She does have some diarrhea on the day that she takes the Vit D for about 2-3 hours. She will follow up for routine testing of vitamin D, at least 2-3 times per year. She was informed of the risk of over-replacement of vitamin D and agrees to not increase her dose unless she discusses this with [place] first.  Depression with emotional eating behaviors Wanda Burns is struggling with emotional eating and using food for comfort to the extent that it is negatively impacting her health. She often snacks when she is not hungry. [name] sometimes feels she is out of control and then feels guilty that she made poor food choices. She has been working on behavior modification techniques to help reduce her emotional eating and has been somewhat successful. She does state that symptoms have improved. She shows no sign  of suicidal or homicidal ideations. She has been seeing Dr. Mallie Mussel for CBT.   Constipation Wanda Burns notes constipation for the last few weeks, worse since attempting weight loss. She states BM are less frequent and are hard and painful. She denies hematochezia or melena. She admits to drinking less H20 recently.  Depression screen [facility] 2/9 01/07/2018 12/24/2017 12/09/2017  Decreased Interest 1 3 2   Down, Depressed, Hopeless 2 3 2   PHQ - 2 Score 3 6 4   Altered sleeping 1 3 1   Tired, decreased energy 1 3 3   Change in appetite 1 2 1   Feeling bad or failure about yourself  1 1 3   Trouble concentrating 1 3 3   Moving slowly or fidgety/restless 0 0 0  Suicidal thoughts 0 0 0  PHQ-9 Score 8 18 15   Difficult doing work/chores - - Somewhat difficult   Constipation Wanda Burns notes constipation for the last few weeks, worse since attempting weight loss. She complains of irritation in the rectal area. She states BM are less frequent and are hard and painful. She denies hematochezia or melena.   ALLERGIES: Allergies  Allergen Reactions  . Codeine Itching  . Prednisone Other (See Comments)    Hyper, irritable    MEDICATIONS: Current Outpatient Medications on File Prior to Visit  Medication Sig Dispense Refill  . buPROPion (WELLBUTRIN XL) 300 MG 24 hr tablet Take 300 mg by mouth daily.    Marland Kitchen LORazepam (ATIVAN) 1 MG tablet Take 0.5 mg by mouth  2 (two) times daily as needed.     . metFORMIN (GLUCOPHAGE) 500 MG tablet Take 1 tablet (500 mg total) by mouth daily with breakfast. 30 tablet 0  . PARoxetine (PAXIL) 10 MG tablet Take 10 mg by mouth daily.    . Vitamin D, Ergocalciferol, (DRISDOL) 50000 units CAPS capsule Take 1 capsule (50,000 Units total) by mouth every 7 (seven) days. 4 capsule 0   No current facility-administered medications on file prior to visit.     PAST MEDICAL HISTORY: Past Medical History:  Diagnosis Date  . Anxiety   . Arthritis    fingers  . Chest pain   . Constipation   .  Depression   . Diverticulosis    mild on colonoscopy 2012  . Dyspnea on exertion   . Hay fever   . History of chicken pox   . History of smoking    quit 11/2010  . HLD (hyperlipidemia)   . Lactose intolerance   . Leg edema   . Obesity   . Rheumatoid arthritis ([place])   . Swallowing difficulty     PAST SURGICAL HISTORY: Past Surgical History:  Procedure Laterality Date  . abd [place]  2006   nothing acute, no gallbladder stones  . bilateral tubal    . BUNIONECTOMY Right   . COLONOSCOPY  2012   WNL, mild diverticulosis, rpt 10 yrs  . LIPOMA EXCISION    . LIPOMA EXCISION Right 10/02/2017   Procedure: EXCISION OF SUBCUTANEOUS LIPOMA OF RIGHT UPPER LATERAL ARM;  Surgeon: Donnie Mesa, MD;  Location: [place];  Service: General;  Laterality: Right;  . TUBAL LIGATION    . VAGINAL HYSTERECTOMY  1990    SOCIAL HISTORY: Social History   Tobacco Use  . Smoking status: Former Smoker    Packs/day: 1.00    Types: Cigarettes    Last attempt to quit: 11/10/2010    Years since quitting: 7.1  . Smokeless tobacco: Never Used  . Tobacco comment: Less than 1/2 PPD  Substance Use Topics  . Alcohol use: Yes    Alcohol/week: 1.0 standard drinks    Types: 1 Standard drinks or equivalent per week    Comment: social  . Drug use: No    FAMILY HISTORY: Family History  Problem Relation Age of Onset  . Diabetes Mother   . Hypertension Mother   . Emphysema Mother   . Coronary artery disease Mother 68  . Hyperlipidemia Mother   . Depression Mother   . Anxiety disorder Mother   . Alcohol abuse Mother   . Obesity Mother   . Coronary artery disease Father 69       MI death  . Alcohol abuse Father   . Sudden death Father   . Diabetes Brother   . Diabetes Maternal Aunt   . Colon cancer Maternal Aunt   . Colon cancer Maternal Uncle 17  . Diabetes Maternal Uncle   . Stroke Maternal Grandmother     ROS: Review of Systems  Constitutional: Positive for weight loss.    Gastrointestinal: Positive for diarrhea. Negative for nausea and vomiting.       Negative hematochezia Negative for melena  Musculoskeletal:       Negative for muscle weakness  Endo/Heme/Allergies:       Negative for polyphagia  Psychiatric/Behavioral: Positive for depression. Negative for suicidal ideas.       Negative for homicidal ideations    PHYSICAL EXAM: Blood pressure 105/77, pulse 63, temperature 98.1 F (  36.7 C), temperature source Oral, height 5\' 4"  (1.626 m), weight 191 lb (86.6 kg), SpO2 97 %. Body mass index is 32.79 kg/m. Physical Exam  Constitutional: She is oriented to person, place, and time. She appears well-developed and well-nourished.  Cardiovascular: Normal rate.  Pulmonary/Chest: Effort normal.  Musculoskeletal: Normal range of motion.  Neurological: She is alert and oriented to person, place, and time.  Skin: Skin is warm and dry.  Psychiatric: She has a normal mood and affect. Her behavior is normal.  Vitals reviewed.   RECENT LABS AND TESTS: BMET    Component Value Date/Time   NA 140 12/09/2017 1407   K 4.6 12/09/2017 1407   CL 101 12/09/2017 1407   CO2 21 12/09/2017 1407   GLUCOSE 76 12/09/2017 1407   GLUCOSE 83 09/04/2010 0840   BUN 19 12/09/2017 1407   CREATININE 0.88 12/09/2017 1407   CALCIUM 8.9 12/09/2017 1407   GFRNONAA 70 12/09/2017 1407   GFRAA 80 12/09/2017 1407   Lab Results  Component Value Date   HGBA1C 6.2 (H) 12/09/2017   Lab Results  Component Value Date   INSULIN 14.7 12/09/2017   CBC    Component Value Date/Time   WBC 5.8 12/09/2017 1407   WBC 8.8 09/04/2010 0840   RBC 4.91 12/09/2017 1407   RBC 4.45 09/04/2010 0840   HGB 12.8 12/09/2017 1407   HCT 39.7 12/09/2017 1407   PLT 271.0 09/04/2010 0840   MCV 81 12/09/2017 1407   MCH 26.1 (L) 12/09/2017 1407   MCHC 32.2 12/09/2017 1407   MCHC 33.9 09/04/2010 0840   RDW 16.7 (H) 12/09/2017 1407   LYMPHSABS 1.5 12/09/2017 1407   MONOABS 0.5 09/04/2010 0840    EOSABS 0.0 12/09/2017 1407   BASOSABS 0.0 12/09/2017 1407   Iron/TIBC/Ferritin/ %Sat No results found for: IRON, TIBC, FERRITIN, IRONPCTSAT Lipid Panel     Component Value Date/Time   CHOL 222 (H) 12/09/2017 1407   TRIG 101 12/09/2017 1407   HDL 54 12/09/2017 1407   CHOLHDL 6 09/04/2010 0840   VLDL 26.8 09/04/2010 0840   LDLCALC 148 (H) 12/09/2017 1407   LDLDIRECT 146.2 09/04/2010 0840   Hepatic Function Panel     Component Value Date/Time   PROT 6.7 12/09/2017 1407   ALBUMIN 4.4 12/09/2017 1407   AST 18 12/09/2017 1407   ALT 19 12/09/2017 1407   ALKPHOS 141 (H) 12/09/2017 1407   BILITOT <0.2 12/09/2017 1407      Component Value Date/Time   TSH 2.500 12/09/2017 1407   TSH 1.38 09/04/2010 0840   Results for JACQUELINNE, SPEAK (MRN [id number]) as of 01/07/2018 16:54  Ref. Range 12/09/2017 14:07  Vitamin D, 25-Hydroxy Latest Ref Range: 30.0 - 100.0 ng/mL 37.6   ASSESSMENT AND PLAN: Prediabetes  Vitamin D deficiency  Other constipation  Other depression - with emotional eating  Class 1 obesity with serious comorbidity and body mass index (BMI) of 32.0 to 32.9 in adult, unspecified obesity type  PLAN: Pre-Diabetes Wilhemenia will continue to work on weight loss, exercise, and decreasing simple carbohydrates in her diet to help decrease the risk of diabetes. We dicussed metformin including benefits and risks. She was informed that eating too many simple carbohydrates or too many calories at one sitting increases the likelihood of GI side effects. Larosa will continue taking Metformin 500 mg qd. Ikeisha agreed to follow up with [place] as directed to monitor her progress. Dulse agrees to follow up in our office in 2 weeks.   Vitamin  D Deficiency Glendene was informed that low vitamin D levels contributes to fatigue and are associated with obesity, breast, and colon cancer. She agrees to continue to take prescription Vit D @50 ,000 IU every week and will follow up for routine testing of vitamin  D, at least 2-3 times per year. She was informed of the risk of over-replacement of vitamin D and agrees to not increase her dose unless she discusses this with [place] first. Nastasia agrees to follow up in our office in 2 weeks.  Depression with Emotional Eating Behaviors We discussed behavior modification techniques today to help Chantil deal with her emotional eating and depression. She has agreed to continue seeing Dr Mallie Mussel and will continue taking Buproprion 300 mg qd. Keiana agrees to follow up with our office in 2 weeks.   Constipation Lorice was informed decrease bowel movement frequency is normal while losing weight, but stools should not be hard or painful. She was advised to increase her H20 intake and work on increasing her fiber intake. High fiber foods were discussed today. Dayanara was advised to use OTC hemorrhoid wipes to help with discomfort. Lakeyn agrees to follow up with our office in 2 weeks.   Obesity Adanya is currently in the action stage of change. As such, her goal is to continue with weight loss efforts She has agreed to follow the Category 1 plan Sameerah has been instructed to work up to a goal of 150 minutes of combined cardio and strengthening exercise per week for weight loss and overall health benefits. Tammera will consider doing water aerobics.  We discussed the following Behavioral Modification Stratagies today: increasing lean protein intake, keeping healthy foods in the home and increasing H2O. Allena agrees to add in [place] yogurt if not meeting protein goal at dinner and lunch.  Jasie has agreed to follow up with our clinic in 2 weeks. She was informed of the importance of frequent follow up visits to maximize her success with intensive lifestyle modifications for her multiple health conditions.   OBESITY BEHAVIORAL INTERVENTION VISIT  Today's visit was # 3   Starting weight: 194 lbs Starting date: 12/09/2017 Today's weight : Weight: 191 lb (86.6 kg)  Today's date:  01/07/2018 Total lbs lost to date: 4 lbs At least 15 minutes were spent on discussing the following behavioral intervention visit.   ASK: We discussed the diagnosis of obesity with Simonne Martinet today and Lynnmarie agreed to give [place] permission to discuss obesity behavioral modification therapy today.  ASSESS: Nailea has the diagnosis of obesity and her BMI today is 32.77 Jumana is in the action stage of change   ADVISE: Almina was educated on the multiple health risks of obesity as well as the benefit of weight loss to improve her health. She was advised of the need for long term treatment and the importance of lifestyle modifications to improve her current health and to decrease her risk of future health problems.  AGREE: Multiple dietary modification options and treatment options were discussed and  Heddy agreed to follow the recommendations documented in the above note.  ARRANGE: Biance was educated on the importance of frequent visits to treat obesity as outlined per CMS and USPSTF guidelines and agreed to schedule her next follow up appointment today.  I, Remi Deter, CMA, am acting as Location manager for [employer], [place].  I have reviewed the above documentation for accuracy and completeness, and I agree with the above. [employer], FNP-C.

## 2018-01-08 ENCOUNTER — Encounter (INDEPENDENT_AMBULATORY_CARE_PROVIDER_SITE_OTHER): Payer: Self-pay | Admitting: Family Medicine

## 2018-01-18 ENCOUNTER — Other Ambulatory Visit (INDEPENDENT_AMBULATORY_CARE_PROVIDER_SITE_OTHER): Payer: Self-pay | Admitting: Family Medicine

## 2018-01-18 DIAGNOSIS — E559 Vitamin D deficiency, unspecified: Secondary | ICD-10-CM

## 2018-01-20 ENCOUNTER — Other Ambulatory Visit (INDEPENDENT_AMBULATORY_CARE_PROVIDER_SITE_OTHER): Payer: Self-pay | Admitting: Family Medicine

## 2018-01-20 DIAGNOSIS — E7800 Pure hypercholesterolemia, unspecified: Secondary | ICD-10-CM | POA: Diagnosis not present

## 2018-01-20 DIAGNOSIS — E669 Obesity, unspecified: Secondary | ICD-10-CM | POA: Diagnosis not present

## 2018-01-20 DIAGNOSIS — R7303 Prediabetes: Secondary | ICD-10-CM

## 2018-01-20 DIAGNOSIS — R7301 Impaired fasting glucose: Secondary | ICD-10-CM | POA: Diagnosis not present

## 2018-01-21 ENCOUNTER — Ambulatory Visit (INDEPENDENT_AMBULATORY_CARE_PROVIDER_SITE_OTHER): Payer: Self-pay | Admitting: Family Medicine

## 2018-01-21 ENCOUNTER — Ambulatory Visit (INDEPENDENT_AMBULATORY_CARE_PROVIDER_SITE_OTHER): Payer: Self-pay | Admitting: Psychology

## 2018-01-26 ENCOUNTER — Encounter (INDEPENDENT_AMBULATORY_CARE_PROVIDER_SITE_OTHER): Payer: Self-pay | Admitting: Family Medicine

## 2018-01-26 ENCOUNTER — Ambulatory Visit (INDEPENDENT_AMBULATORY_CARE_PROVIDER_SITE_OTHER): Payer: PPO | Admitting: Psychology

## 2018-01-26 ENCOUNTER — Ambulatory Visit (INDEPENDENT_AMBULATORY_CARE_PROVIDER_SITE_OTHER): Payer: PPO | Admitting: Family Medicine

## 2018-01-26 ENCOUNTER — Other Ambulatory Visit (INDEPENDENT_AMBULATORY_CARE_PROVIDER_SITE_OTHER): Payer: Self-pay | Admitting: Family Medicine

## 2018-01-26 VITALS — BP 121/75 | HR 64 | Temp 97.8°F | Ht 64.0 in | Wt 190.0 lb

## 2018-01-26 DIAGNOSIS — E66811 Obesity, class 1: Secondary | ICD-10-CM

## 2018-01-26 DIAGNOSIS — E669 Obesity, unspecified: Secondary | ICD-10-CM

## 2018-01-26 DIAGNOSIS — F331 Major depressive disorder, recurrent, moderate: Secondary | ICD-10-CM

## 2018-01-26 DIAGNOSIS — Z6832 Body mass index (BMI) 32.0-32.9, adult: Secondary | ICD-10-CM

## 2018-01-26 DIAGNOSIS — E559 Vitamin D deficiency, unspecified: Secondary | ICD-10-CM | POA: Diagnosis not present

## 2018-01-26 DIAGNOSIS — K5900 Constipation, unspecified: Secondary | ICD-10-CM

## 2018-01-26 DIAGNOSIS — R7303 Prediabetes: Secondary | ICD-10-CM

## 2018-01-26 MED ORDER — VITAMIN D (ERGOCALCIFEROL) 1.25 MG (50000 UNIT) PO CAPS: 50000.0000 [IU] | ORAL_CAPSULE | ORAL | 0 refills | Status: DC

## 2018-01-26 MED ORDER — METFORMIN HCL 500 MG PO TABS: 500.0000 mg | ORAL_TABLET | Freq: Every day | ORAL | 0 refills | Status: DC

## 2018-01-26 NOTE — Progress Notes (Signed)
Office: [phone]  /  Fax: [phone]   Date: January 26, 2018   Time Seen: 8:35am Duration: 37 minutes Provider: Glennie Isle, Psy.D. Type of Session: Individual Therapy   HPI: Wanda Burns referred by Dr. Lillia Carmel to positive depression screen. She was seen for an initial appointment by this provider on December 24, 2017. Per the note for the initial visit withDr. Desmond Burns December 09, 2017,"Wanda Burns is retired and she lives alone. She has a history of sneaking food due to embarrassment and feelings of guilt when she eats. Wanda Burns "gives up" on healthy eating, but she is unsure if she has an all or nothing personality."Wanda Burns's Food and Mood (modified PHQ-9) score was15. In addition, per the note for the initial visit withDr. Desmond Burns December 09, 2017,Wanda Burns described herself as a picky eater and does not like to eat healthier foods.Wanda Burns started gaining weight at age 69 and her heaviest weight ever was 200 pounds.During her initial appointment with Dr. Charisse Burns experiencing the following:significant food cravings issues; snacking frequently in the evenings; skipping meals frequently; frequently drinking liquids with calories; problems with excessive hunger; binge eating behaviors; and struggling with emotional eating.During the initial appointment with this provider, Wanda Burns reported experiencing difficulty "reducing" her weight, which "stems off" to medical difficulties (e.g., diabetes). She also reported her weight impacts her mood. Currently, Wanda Burns stated she takes two antidepressants, but is unsure if they are helping. She reported a history of experiencing emotional eating with the last episode being the week prior to the initial appointment with this provider. Wanda Burns reported a history of binge eating, and added, "If I don't have it in the house, I won't do it." She could not recall the last time she engaged in binge eating. Wanda Burns denied a history of  purging and engagement in other compensatory strategies. She denied ever being diagnosed with an eating disorder. Moreover, Wanda Burns reported emotional eating started in childhood; however, she was skinny and did not start gaining weight until her 65s.Furthermore, Wanda Burns asked to complete a questionnaire assessing various behaviors related to emotional eating. Lauraendorsed the following: experience food cravings on a regular basis, eat certain foods when you are anxious, stressed, depressed, or your feelings are hurt, find food is comforting to you, overeat when you are angry or upset, not worry about what you eat when you are in a good mood, overeat when you are alone, but eat much less when you are with other people and eat as a reward.  Session Content: Session focused on the following treatment goal: decrease emotional eating. The session was initiated with the administration of the PHQ-9 and GAD-7, as well as a brief check-in. Wanda Burns discussed making "progress" on her established goal from the last appointment, as it relates to chores. She was receptive to continuing the goal, but increasing the time spent on it from five to ten minutes daily. Wanda Burns also reported an increase in protein intake, and denied episodes of emotional eating since the last appointment with this provider. However, she reported difficulty consuming vegetables with her meals. Thus, Wanda Burns was engaged in problem solving. She reported willingness to eat her vegetables prior to moving onto the protein portion of her meals. Moreover, Wanda Burns reported an ongoing stressor related to family. As such, psychoeducation regarding the connection between thoughts, feelings, and behaviors was provided. Wanda Burns was given a handout outlining the aforementioned. Psychoeducation regarding pleasurable activities was also provided. She expressed willingness to engage in one activity a day. Wanda Burns was receptive to today's session as evidenced  by openness to  sharing, responsiveness to feedback, and willingness to engage in pleasurable activities and increase her vegetable consumption.   Mental Status Examination: Wanda Burns arrived on time for the appointment; however, the appointment was initiated late due to this provider. She presented as appropriately dressed and groomed. Wanda Burns appeared her stated age and demonstrated adequate orientation to time, place, person, and purpose of the appointment. She also demonstrated appropriate eye contact. No psychomotor abnormalities or behavioral peculiarities noted. Her mood was euthymic with congruent affect. Her thought processes were logical, linear, and goal-directed. No hallucinations, delusions, bizarre thinking or behavior reported or observed. Judgment, insight, and impulse control appeared to be grossly intact. There was no evidence of paraphasias (i.e., errors in speech, gross mispronunciations, and word substitutions), repetition deficits, or disturbances in volume or prosody (i.e., rhythm and intonation). There was no evidence of attention or memory impairments. Wanda Burns denied current suicidal and homicidal ideation, intent or plan.  Structured Assessment Results: The Patient Health Questionnaire-9 (PHQ-9) is a self-report measure that assesses symptoms and severity of depression over the course of the last two weeks. Wanda Burns obtained a score of five suggesting mild depression. Wanda Burns finds the endorsed symptoms to be somewhat difficult. Depression screen PHQ 2/9 01/26/2018  Decreased Interest 1  Down, Depressed, Hopeless 1  PHQ - 2 Score 2  Altered sleeping 0  Tired, decreased energy 1  Change in appetite 1  Feeling bad or failure about yourself  0  Trouble concentrating 1  Moving slowly or fidgety/restless 0  Suicidal thoughts 0  PHQ-9 Score 5  Difficult doing work/chores -   The Generalized Anxiety Disorder-7 (GAD-7) is a brief self-report measure that assesses symptoms of anxiety over the course of the  last two weeks. Wanda Burns obtained a score of four suggesting minimal anxiety. GAD 7 : Generalized Anxiety Score 01/26/2018  Nervous, Anxious, on Edge 1  Control/stop worrying 1  Worry too much - different things 1  Trouble relaxing 1  Restless 0  Easily annoyed or irritable 0  Afraid - awful might happen 0  Total GAD 7 Score 4  Anxiety Difficulty Not difficult at all   Interventions: Wanda Burns was administered the PHQ-9 and GAD-7 for symptom monitoring. Content from the last session was reviewed. Throughout today's session, empathic reflections and validation were provided. Goal setting was briefly discussed, and Wanda Burns was engaged in problem solving to increase vegetable intake. Psychoeducation regarding the connection between thoughts, feelings, and behaviors, as well as pleasurable activities was provided.  DSM-5 Diagnosis: 296.32 (F33.1) Major Depressive Disorder, Recurrent Episode, Moderate  Treatment Goal & Progress: Wanda Burns was seen for an initial appointment with this provider on December 24, 2017 during which the following treatment goal was established: decrease emotional eating. Betrice has demonstrated progress in her goal of decreasing emotional eating as evidenced by increased awareness in hunger patterns and triggers for emotional eating. During today's appointment, Evee noted an increase in protein intake and denied engaging in emotional eating since the last appointment with this provider.   Plan: Analeah continues to appear able and willing to participate as evidenced by engagement in reciprocal conversation, and asking questions for clarification as appropriate. The next appointment will be scheduled in two weeks. The next session will focus on reviewing pleasurable activies, and working towards the established treatment goal.

## 2018-01-27 DIAGNOSIS — R7301 Impaired fasting glucose: Secondary | ICD-10-CM | POA: Diagnosis not present

## 2018-01-27 DIAGNOSIS — F329 Major depressive disorder, single episode, unspecified: Secondary | ICD-10-CM | POA: Diagnosis not present

## 2018-01-27 DIAGNOSIS — Z23 Encounter for immunization: Secondary | ICD-10-CM | POA: Diagnosis not present

## 2018-01-27 DIAGNOSIS — E7800 Pure hypercholesterolemia, unspecified: Secondary | ICD-10-CM | POA: Diagnosis not present

## 2018-01-27 DIAGNOSIS — F419 Anxiety disorder, unspecified: Secondary | ICD-10-CM | POA: Diagnosis not present

## 2018-01-27 DIAGNOSIS — Z1211 Encounter for screening for malignant neoplasm of colon: Secondary | ICD-10-CM | POA: Diagnosis not present

## 2018-01-27 DIAGNOSIS — F5102 Adjustment insomnia: Secondary | ICD-10-CM | POA: Diagnosis not present

## 2018-01-28 ENCOUNTER — Other Ambulatory Visit (INDEPENDENT_AMBULATORY_CARE_PROVIDER_SITE_OTHER): Payer: Self-pay

## 2018-01-28 DIAGNOSIS — E559 Vitamin D deficiency, unspecified: Secondary | ICD-10-CM

## 2018-01-28 MED ORDER — VITAMIN D (ERGOCALCIFEROL) 1.25 MG (50000 UNIT) PO CAPS: 50000.0000 [IU] | ORAL_CAPSULE | ORAL | 0 refills | Status: DC

## 2018-01-28 NOTE — Progress Notes (Signed)
Office: [phone]  /  Fax: [phone]   HPI:   Chief Complaint: OBESITY Wanda Burns is here to discuss her progress with her obesity treatment plan. She is on the Category 1 plan and is following her eating plan approximately 80 to 85% of the time. She states she is walking and doing yard work 2 minutes 2 times per week. Wanda Burns is eating all of her food on the Category 1 plan. Her weight is 190 lb (86.2 kg) today and has had a weight loss of 1 pound over a period of 2 to 3 weeks since her last visit. She has lost 4 lbs since starting treatment with [place].  Pre-Diabetes Wanda Burns has a diagnosis of prediabetes based on her elevated Hgb A1c of 6.2 and was informed this puts her at greater risk of developing diabetes. She is taking metformin currently and continues to work on diet and exercise to decrease risk of diabetes. She denies nausea or hypoglycemia.  Vitamin D deficiency Wanda Burns has a diagnosis of vitamin D deficiency. She is stable on vit D, but she is not yet at goal. Wanda Burns denies nausea, vomiting or muscle weakness.  Constipation Wanda Burns notes that her BM patterns have changed. She states BM are less frequent and are hard and painful. She denies hematochezia or melena. She does not take fiber.  ALLERGIES: Allergies  Allergen Reactions  . Codeine Itching  . Prednisone Other (See Comments)    Hyper, irritable    MEDICATIONS: Current Outpatient Medications on File Prior to Visit  Medication Sig Dispense Refill  . buPROPion (WELLBUTRIN XL) 300 MG 24 hr tablet Take 300 mg by mouth daily.    Marland Kitchen LORazepam (ATIVAN) 1 MG tablet Take 0.5 mg by mouth 2 (two) times daily as needed.     Marland Kitchen PARoxetine (PAXIL) 10 MG tablet Take 10 mg by mouth daily.     No current facility-administered medications on file prior to visit.     PAST MEDICAL HISTORY: Past Medical History:  Diagnosis Date  . Anxiety   . Arthritis    fingers  . Chest pain   . Constipation   . Depression   . Diverticulosis    mild on colonoscopy 2012  . Dyspnea on exertion   . Hay fever   . History of chicken pox   . History of smoking    quit 11/2010  . HLD (hyperlipidemia)   . Lactose intolerance   . Leg edema   . Obesity   . Rheumatoid arthritis ([place])   . Swallowing difficulty     PAST SURGICAL HISTORY: Past Surgical History:  Procedure Laterality Date  . abd [place]  2006   nothing acute, no gallbladder stones  . bilateral tubal    . BUNIONECTOMY Right   . COLONOSCOPY  2012   WNL, mild diverticulosis, rpt 10 yrs  . LIPOMA EXCISION    . LIPOMA EXCISION Right 10/02/2017   Procedure: EXCISION OF SUBCUTANEOUS LIPOMA OF RIGHT UPPER LATERAL ARM;  Surgeon: Donnie Mesa, MD;  Location: [facility];  Service: General;  Laterality: Right;  . TUBAL LIGATION    . VAGINAL HYSTERECTOMY  1990    SOCIAL HISTORY: Social History   Tobacco Use  . Smoking status: Former Smoker    Packs/day: 1.00    Types: Cigarettes    Last attempt to quit: 11/10/2010    Years since quitting: 7.2  . Smokeless tobacco: Never Used  . Tobacco comment: Less than 1/2 PPD  Substance Use Topics  .  Alcohol use: Yes    Alcohol/week: 1.0 standard drinks    Types: 1 Standard drinks or equivalent per week    Comment: social  . Drug use: No    FAMILY HISTORY: Family History  Problem Relation Age of Onset  . Diabetes Mother   . Hypertension Mother   . Emphysema Mother   . Coronary artery disease Mother 31  . Hyperlipidemia Mother   . Depression Mother   . Anxiety disorder Mother   . Alcohol abuse Mother   . Obesity Mother   . Coronary artery disease Father 78       MI death  . Alcohol abuse Father   . Sudden death Father   . Diabetes Brother   . Diabetes Maternal Aunt   . Colon cancer Maternal Aunt   . Colon cancer Maternal Uncle 10  . Diabetes Maternal Uncle   . Stroke Maternal Grandmother     ROS: Review of Systems  Constitutional: Positive for weight loss.  Gastrointestinal: Positive for  constipation. Negative for melena, nausea and vomiting.       Negative for hematochezia  Musculoskeletal:       Negative for muscle weakness  Endo/Heme/Allergies:       Negative for polyphagia Negative for hypoglycemia    PHYSICAL EXAM: Blood pressure 121/75, pulse 64, temperature 97.8 F (36.6 C), temperature source Oral, height 5\' 4"  (1.626 m), weight 190 lb (86.2 kg), SpO2 96 %. Body mass index is 32.61 kg/m. Physical Exam  Constitutional: She is oriented to person, place, and time. She appears well-developed and well-nourished.  Cardiovascular: Normal rate.  Pulmonary/Chest: Effort normal.  Musculoskeletal: Normal range of motion.  Neurological: She is oriented to person, place, and time.  Skin: Skin is warm and dry.  Psychiatric: She has a normal mood and affect. Her behavior is normal.  Vitals reviewed.   RECENT LABS AND TESTS: BMET    Component Value Date/Time   NA 140 12/09/2017 1407   K 4.6 12/09/2017 1407   CL 101 12/09/2017 1407   CO2 21 12/09/2017 1407   GLUCOSE 76 12/09/2017 1407   GLUCOSE 83 09/04/2010 0840   BUN 19 12/09/2017 1407   CREATININE 0.88 12/09/2017 1407   CALCIUM 8.9 12/09/2017 1407   GFRNONAA 70 12/09/2017 1407   GFRAA 80 12/09/2017 1407   Lab Results  Component Value Date   HGBA1C 6.2 (H) 12/09/2017   Lab Results  Component Value Date   INSULIN 14.7 12/09/2017   CBC    Component Value Date/Time   WBC 5.8 12/09/2017 1407   WBC 8.8 09/04/2010 0840   RBC 4.91 12/09/2017 1407   RBC 4.45 09/04/2010 0840   HGB 12.8 12/09/2017 1407   HCT 39.7 12/09/2017 1407   PLT 271.0 09/04/2010 0840   MCV 81 12/09/2017 1407   MCH 26.1 (L) 12/09/2017 1407   MCHC 32.2 12/09/2017 1407   MCHC 33.9 09/04/2010 0840   RDW 16.7 (H) 12/09/2017 1407   LYMPHSABS 1.5 12/09/2017 1407   MONOABS 0.5 09/04/2010 0840   EOSABS 0.0 12/09/2017 1407   BASOSABS 0.0 12/09/2017 1407   Iron/TIBC/Ferritin/ %Sat No results found for: IRON, TIBC, FERRITIN,  IRONPCTSAT Lipid Panel     Component Value Date/Time   CHOL 222 (H) 12/09/2017 1407   TRIG 101 12/09/2017 1407   HDL 54 12/09/2017 1407   CHOLHDL 6 09/04/2010 0840   VLDL 26.8 09/04/2010 0840   LDLCALC 148 (H) 12/09/2017 1407   LDLDIRECT 146.2 09/04/2010 0840   Hepatic  Function Panel     Component Value Date/Time   PROT 6.7 12/09/2017 1407   ALBUMIN 4.4 12/09/2017 1407   AST 18 12/09/2017 1407   ALT 19 12/09/2017 1407   ALKPHOS 141 (H) 12/09/2017 1407   BILITOT <0.2 12/09/2017 1407      Component Value Date/Time   TSH 2.500 12/09/2017 1407   TSH 1.38 09/04/2010 0840   Results for KINZI, FREDIANI (MRN [id number]) as of 01/28/2018 17:17  Ref. Range 12/09/2017 14:07  Vitamin D, 25-Hydroxy Latest Ref Range: 30.0 - 100.0 ng/mL 37.6   ASSESSMENT AND PLAN: Prediabetes - Plan: DISCONTINUED: metFORMIN (GLUCOPHAGE) 500 MG tablet  Vitamin D deficiency - Plan: Vitamin D, Ergocalciferol, (DRISDOL) 1.25 MG (50000 UT) CAPS capsule  Constipation, unspecified constipation type  Class 1 obesity with serious comorbidity and body mass index (BMI) of 32.0 to 32.9 in adult, unspecified obesity type  PLAN:  Pre-Diabetes Wanda Burns will continue to work on weight loss, exercise, and decreasing simple carbohydrates in her diet to help decrease the risk of diabetes. We dicussed metformin including benefits and risks. She was informed that eating too many simple carbohydrates or too many calories at one sitting increases the likelihood of GI side effects. Wanda Burns agreed to continue metformin for now and a prescription was written today for 1 month refill. Wanda Burns agreed to follow up with [place] as directed to monitor her progress.  Vitamin D Deficiency Wanda Burns was informed that low vitamin D levels contributes to fatigue and are associated with obesity, breast, and colon cancer. She agrees to continue to take prescription Vit D @50 ,000 IU every week #12 with no refills and will follow up for routine testing of  vitamin D, at least 2-3 times per year. She was informed of the risk of over-replacement of vitamin D and agrees to not increase her dose unless she discusses this with [place] first. Wanda Burns agrees to follow up as directed.  Constipation Wanda Burns was informed decrease bowel movement frequency is normal while losing weight, but stools should not be hard or painful.  She was recommended Miralax daily and she was advised to increase her H20 intake and work on increasing her fiber intake. High fiber foods were discussed today.   Obesity Wanda Burns is currently in the action stage of change. As such, her goal is to continue with weight loss efforts She has agreed to follow the Category 1 plan Wanda Burns has been instructed to conrinue walking and doing yard work 2 minutes 2 times per week for weight loss and overall health benefits. We discussed the following Behavioral Modification Strategies today: increase H2O intake, work on meal planning and easy cooking plans, holiday eating strategies  and avoiding temptations  Wanda Burns has agreed to follow up with our clinic in 2 weeks. She was informed of the importance of frequent follow up visits to maximize her success with intensive lifestyle modifications for her multiple health conditions.   OBESITY BEHAVIORAL INTERVENTION VISIT  Today's visit was # 4  Starting weight: 194 lbs Starting date: 12/09/2017 Today's weight : 190 lbs Today's date: 01/26/2018 Total lbs lost to date: 4 At least 15 minutes were spent on discussing the following behavioral intervention visit.   ASK: We discussed the diagnosis of obesity with Simonne Martinet today and Micole agreed to give [place] permission to discuss obesity behavioral modification therapy today.  ASSESS: Marizol has the diagnosis of obesity and her BMI today is 32.6 Kyra is in the action stage of change   ADVISE: Shaaron was  educated on the multiple health risks of obesity as well as the benefit of weight loss to improve her  health. She was advised of the need for long term treatment and the importance of lifestyle modifications to improve her current health and to decrease her risk of future health problems.  AGREE: Multiple dietary modification options and treatment options were discussed and  Melea agreed to follow the recommendations documented in the above note.  ARRANGE: Marcelina was educated on the importance of frequent visits to treat obesity as outlined per CMS and USPSTF guidelines and agreed to schedule her next follow up appointment today.  Corey Skains, am acting as Location manager for [employer], FNP-C.  I have reviewed the above documentation for accuracy and completeness, and I agree with the above.  - Braelynne Garinger, FNP-C.

## 2018-01-29 ENCOUNTER — Encounter (INDEPENDENT_AMBULATORY_CARE_PROVIDER_SITE_OTHER): Payer: Self-pay | Admitting: Family Medicine

## 2018-02-09 DIAGNOSIS — J22 Unspecified acute lower respiratory infection: Secondary | ICD-10-CM | POA: Diagnosis not present

## 2018-02-09 DIAGNOSIS — J029 Acute pharyngitis, unspecified: Secondary | ICD-10-CM | POA: Diagnosis not present

## 2018-02-11 NOTE — Progress Notes (Signed)
Office: [phone]  /  Fax: [phone]   Date: February 17, 2018 Time Seen: 8:00am Duration: 32 minutes Provider: Glennie Isle, Psy.D. Type of Session: Individual Therapy   HPI: Laurawas referred by Dr. Lillia Carmel to positive depression screen.She was seen for an initial appointment by this provider on December 24, 2017.Per the note for the initial visit withDr. Desmond Dike December 09, 2017,"Serena is retired and she lives alone. She has a history of sneaking food due to embarrassment and feelings of guilt when she eats. Joanell "gives up" on healthy eating, but she is unsure if she has an all or nothing personality."Jazzalyn's Food and Mood (modified PHQ-9) score was15.In addition, per the note for the initial visit withDr. Desmond Dike December 09, 2017,Massa described herself as a picky eater and does not like to eat healthier foods.Kaylene started gaining weight at age 67 and her heaviest weight ever was 200 pounds.During her initial appointment with Dr. Charisse Klinefelter experiencing the following:significant food cravings issues; snacking frequently in the evenings; skipping meals frequently; frequently drinking liquids with calories; problems with excessive hunger; binge eating behaviors; and struggling with emotional eating.During the initial appointment with this provider, Keerat reported experiencing difficulty "reducing" her weight, which "stems off" to medical difficulties (e.g., diabetes). She also reported her weight impacts her mood. Currently, Lucillie stated she takes two antidepressants, but is unsure if they are helping. She reported a history of experiencing emotional eating with the last episode beingthe week prior to the initial appointment with this provider.Verle reported a history of binge eating, and added, "If I don't have it in the house, I won't do it." She could not recall the last time she engaged in binge eating. Kenecia denied a history of purging  and engagement in other compensatory strategies. She denied ever being diagnosed with an eating disorder. Moreover, Crytal reported emotional eating started in childhood; however, she was skinny and did not start gaining weight until her 45s.Furthermore,Laurawas asked to complete a questionnaire assessing various behaviors related to emotional eating. Lauraendorsed the following: experience food cravings on a regular basis, eat certain foods when you are anxious, stressed, depressed, or your feelings are hurt, find food is comforting to you, overeat when you are angry or upset, not worry about what you eat when you are in a good mood, overeat when you are alone, but eat much less when you are with other people and eat as a reward.  Session Content: Session focused on the following treatment goal: decrease emotional eating. The session was initiated with the administration of the PHQ-9 and GAD-7, as well as a brief check-in. Dasiah discussed not being able to eat the prescribed meal plan due to current circumstances and stressors, but noted a belief of weight loss. This provider explored what Baelyn consumed since the last appointment. She described snacking when out, but stated she would eat a frozen meal for dinner. Overall, Socorro described making better choices. Brief psychoeducation regarding healthy weight loss was provided. Additionally, Ommie described engaging in self-care via pleasurable activities, including reading, playing cards, and watching television. She also noted engaging in meal prepping. Sahalie acknowledged one episode of emotional eating, including pizza and lava cake, while she was sick. She denied any other episodes, and stated an overall decrease in emotional eating. She also denied recent marijuana use. Psychoeducation regarding mindfulness was provided. A handout was provided to Caitlyn with further information regarding mindfulness, including exercises. This provider also explained the  benefit of mindfulness as it relates to emotional eating.  Lenzie was encouraged to engage in the provided exercises between now and the next appointment with this provider. Avianna agreed.  Today's appointment also focused on termination planning, including the option of a referral for longer-term therapeutic services. Deepa was receptive to today's session as evidenced by openness to sharing, responsiveness to feedback, and willingness to engage in mindfulness.    Mental Status Examination: Scarlet arrived on time for the appointment. She presented as appropriately dressed and groomed. Marguarite appeared her stated age and demonstrated adequate orientation to time, place, person, and purpose of the appointment. She also demonstrated appropriate eye contact. No psychomotor abnormalities or behavioral peculiarities noted. Her mood was euthymic with congruent affect. Her thought processes were logical, linear, and goal-directed. No hallucinations, delusions, bizarre thinking or behavior reported or observed. Judgment, insight, and impulse control appeared to be grossly intact. There was no evidence of paraphasias (i.e., errors in speech, gross mispronunciations, and word substitutions), repetition deficits, or disturbances in volume or prosody (i.e., rhythm and intonation). There was no evidence of attention or memory impairments. Genola denied current suicidal and homicidal ideation, intent or plan.  Structured Assessment Results: The Patient Health Questionnaire-9 (PHQ-9) is a self-report measure that assesses symptoms and severity of depression over the course of the last two weeks. Maikayla obtained a score of 4 suggesting minimal depression. Adaly finds the endorsed symptoms to be not difficult at all. Depression screen PHQ 2/9 02/17/2018  Decreased Interest 1  Down, Depressed, Hopeless 1  PHQ - 2 Score 2  Altered sleeping 0  Tired, decreased energy 0  Change in appetite 1  Feeling bad or failure about yourself   1  Trouble concentrating 0  Moving slowly or fidgety/restless 0  Suicidal thoughts 0  PHQ-9 Score 4  Difficult doing work/chores -   The Generalized Anxiety Disorder-7 (GAD-7) is a brief self-report measure that assesses symptoms of anxiety over the course of the last two weeks. Aleea obtained a score of 6 suggesting mild anxiety. GAD 7 : Generalized Anxiety Score 02/17/2018  Nervous, Anxious, on Edge 1  Control/stop worrying 1  Worry too much - different things 1  Trouble relaxing 1  Restless 1  Easily annoyed or irritable 0  Afraid - awful might happen 1  Total GAD 7 Score 6  Anxiety Difficulty Somewhat difficult   Interventions: Querida was administered the PHQ-9 and GAD-7 for symptom monitoring. Content from the last session was reviewed. Throughout today's session, empathic reflections and validation were provided. Psychoeducation regarding healthy weight loss and mindfulness was provided.  DSM-5 Diagnosis: 296.32 (F33.1) Major Depressive Disorder, Recurrent Episode, Moderate  Treatment Goal & Progress: Tyeasha was seen for an initial appointment with this provider on December 24, 2017 during which the following treatment goal was established: decrease emotional eating. Randal has demonstrated progress in her goal as evidenced by increased awareness of hunger patterns and triggers for emotional eating. During today's appointment, Korinne indicated an overall decrease in emotional eating. She also described engaging in meal prepping and demonstrated willingness to engage in learned skills.  Plan: Tarin continues to appear able and willing to participate as evidenced by engagement in reciprocal conversation, and asking questions for clarification as appropriate. The next appointment will be scheduled in one month due to the upcoming holidays. The next session will focus on reviewing mindfulness, and working towards the established treatment goal.

## 2018-02-17 ENCOUNTER — Encounter (INDEPENDENT_AMBULATORY_CARE_PROVIDER_SITE_OTHER): Payer: Self-pay | Admitting: Family Medicine

## 2018-02-17 ENCOUNTER — Ambulatory Visit (INDEPENDENT_AMBULATORY_CARE_PROVIDER_SITE_OTHER): Payer: PPO | Admitting: Psychology

## 2018-02-17 ENCOUNTER — Ambulatory Visit (INDEPENDENT_AMBULATORY_CARE_PROVIDER_SITE_OTHER): Payer: PPO | Admitting: Family Medicine

## 2018-02-17 VITALS — BP 133/85 | HR 66 | Temp 97.7°F | Ht 64.0 in | Wt 188.0 lb

## 2018-02-17 DIAGNOSIS — E669 Obesity, unspecified: Secondary | ICD-10-CM

## 2018-02-17 DIAGNOSIS — F331 Major depressive disorder, recurrent, moderate: Secondary | ICD-10-CM

## 2018-02-17 DIAGNOSIS — K5909 Other constipation: Secondary | ICD-10-CM | POA: Diagnosis not present

## 2018-02-17 DIAGNOSIS — E559 Vitamin D deficiency, unspecified: Secondary | ICD-10-CM | POA: Diagnosis not present

## 2018-02-17 DIAGNOSIS — Z6832 Body mass index (BMI) 32.0-32.9, adult: Secondary | ICD-10-CM

## 2018-02-17 NOTE — Progress Notes (Signed)
Office: [phone]  /  Fax: [phone]   HPI:   Chief Complaint: OBESITY Wanda Burns is here to discuss her progress with her obesity treatment plan. She is on the  follow the Category 1 plan and is following her eating plan approximately 80 % of the time. She states she is exercising 0 minutes 0 times per week. Wanda Burns has been ill with upper respiratory infection. She has been off track but now is mostly back on track.   Her weight is 188 lb (85.3 kg) today and has had a weight loss of 2 pounds over a period of 3 weeks since her last visit. She has lost 6 lbs since starting treatment with [place].  Vitamin D deficiency Wanda Burns has a diagnosis of vitamin D deficiency. She is currently taking vit D and denies nausea, vomiting or muscle weakness.  Ref. Range 12/09/2017 14:07  Vitamin D, 25-Hydroxy Latest Ref Range: 30.0 - 100.0 ng/mL 37.6   Constipation Wanda Burns reported constipation at out last visit. She denies hematochezia or melena She denies drinking less H20 recently. She has increased water and vegetable intake, and constipation is better.   ALLERGIES: Allergies  Allergen Reactions  . Codeine Itching  . Prednisone Other (See Comments)    Hyper, irritable    MEDICATIONS: Current Outpatient Medications on File Prior to Visit  Medication Sig Dispense Refill  . buPROPion (WELLBUTRIN XL) 300 MG 24 hr tablet Take 300 mg by mouth daily.    Marland Kitchen LORazepam (ATIVAN) 1 MG tablet Take 0.5 mg by mouth 2 (two) times daily as needed.     . metFORMIN (GLUCOPHAGE) 500 MG tablet TAKE 1 TABLET BY MOUTH DAILY WITH BREAKFAST 90 tablet 0  . PARoxetine (PAXIL) 10 MG tablet Take 10 mg by mouth daily.    . Vitamin D, Ergocalciferol, (DRISDOL) 1.25 MG (50000 UT) CAPS capsule Take 1 capsule (50,000 Units total) by mouth every 7 (seven) days. 12 capsule 0   No current facility-administered medications on file prior to visit.     PAST MEDICAL HISTORY: Past Medical History:  Diagnosis Date  . Anxiety   .  Arthritis    fingers  . Chest pain   . Constipation   . Depression   . Diverticulosis    mild on colonoscopy 2012  . Dyspnea on exertion   . Hay fever   . History of chicken pox   . History of smoking    quit 11/2010  . HLD (hyperlipidemia)   . Lactose intolerance   . Leg edema   . Obesity   . Rheumatoid arthritis ([place])   . Swallowing difficulty     PAST SURGICAL HISTORY: Past Surgical History:  Procedure Laterality Date  . abd [place]  2006   nothing acute, no gallbladder stones  . bilateral tubal    . BUNIONECTOMY Right   . COLONOSCOPY  2012   WNL, mild diverticulosis, rpt 10 yrs  . LIPOMA EXCISION    . LIPOMA EXCISION Right 10/02/2017   Procedure: EXCISION OF SUBCUTANEOUS LIPOMA OF RIGHT UPPER LATERAL ARM;  Surgeon: Donnie Mesa, MD;  Location: [facility];  Service: General;  Laterality: Right;  . TUBAL LIGATION    . VAGINAL HYSTERECTOMY  1990    SOCIAL HISTORY: Social History   Tobacco Use  . Smoking status: Former Smoker    Packs/day: 1.00    Types: Cigarettes    Last attempt to quit: 11/10/2010    Years since quitting: 7.2  . Smokeless tobacco: Never Used  .  Tobacco comment: Less than 1/2 PPD  Substance Use Topics  . Alcohol use: Yes    Alcohol/week: 1.0 standard drinks    Types: 1 Standard drinks or equivalent per week    Comment: social  . Drug use: No    FAMILY HISTORY: Family History  Problem Relation Age of Onset  . Diabetes Mother   . Hypertension Mother   . Emphysema Mother   . Coronary artery disease Mother 30  . Hyperlipidemia Mother   . Depression Mother   . Anxiety disorder Mother   . Alcohol abuse Mother   . Obesity Mother   . Coronary artery disease Father 63       MI death  . Alcohol abuse Father   . Sudden death Father   . Diabetes Brother   . Diabetes Maternal Aunt   . Colon cancer Maternal Aunt   . Colon cancer Maternal Uncle 101  . Diabetes Maternal Uncle   . Stroke Maternal Grandmother     ROS: Review of  Systems  Constitutional: Positive for weight loss.  Gastrointestinal: Positive for constipation. Negative for nausea and vomiting.  Musculoskeletal:       Negative for muscle weakness    PHYSICAL EXAM: Blood pressure 133/85, pulse 66, temperature 97.7 F (36.5 C), temperature source Oral, height 5\' 4"  (1.626 m), weight 188 lb (85.3 kg), SpO2 96 %. Body mass index is 32.27 kg/m. Physical Exam  Constitutional: She is oriented to person, place, and time. She appears well-developed and well-nourished.  Cardiovascular: Normal rate.  Pulmonary/Chest: Effort normal.  Musculoskeletal: Normal range of motion.  Neurological: She is alert and oriented to person, place, and time.  Skin: Skin is warm and dry.  Psychiatric: She has a normal mood and affect. Her behavior is normal.  Vitals reviewed.   RECENT LABS AND TESTS: BMET    Component Value Date/Time   NA 140 12/09/2017 1407   K 4.6 12/09/2017 1407   CL 101 12/09/2017 1407   CO2 21 12/09/2017 1407   GLUCOSE 76 12/09/2017 1407   GLUCOSE 83 09/04/2010 0840   BUN 19 12/09/2017 1407   CREATININE 0.88 12/09/2017 1407   CALCIUM 8.9 12/09/2017 1407   GFRNONAA 70 12/09/2017 1407   GFRAA 80 12/09/2017 1407   Lab Results  Component Value Date   HGBA1C 6.2 (H) 12/09/2017   Lab Results  Component Value Date   INSULIN 14.7 12/09/2017   CBC    Component Value Date/Time   WBC 5.8 12/09/2017 1407   WBC 8.8 09/04/2010 0840   RBC 4.91 12/09/2017 1407   RBC 4.45 09/04/2010 0840   HGB 12.8 12/09/2017 1407   HCT 39.7 12/09/2017 1407   PLT 271.0 09/04/2010 0840   MCV 81 12/09/2017 1407   MCH 26.1 (L) 12/09/2017 1407   MCHC 32.2 12/09/2017 1407   MCHC 33.9 09/04/2010 0840   RDW 16.7 (H) 12/09/2017 1407   LYMPHSABS 1.5 12/09/2017 1407   MONOABS 0.5 09/04/2010 0840   EOSABS 0.0 12/09/2017 1407   BASOSABS 0.0 12/09/2017 1407   Iron/TIBC/Ferritin/ %Sat No results found for: IRON, TIBC, FERRITIN, IRONPCTSAT Lipid Panel       Component Value Date/Time   CHOL 222 (H) 12/09/2017 1407   TRIG 101 12/09/2017 1407   HDL 54 12/09/2017 1407   CHOLHDL 6 09/04/2010 0840   VLDL 26.8 09/04/2010 0840   LDLCALC 148 (H) 12/09/2017 1407   LDLDIRECT 146.2 09/04/2010 0840   Hepatic Function Panel     Component Value Date/Time  PROT 6.7 12/09/2017 1407   ALBUMIN 4.4 12/09/2017 1407   AST 18 12/09/2017 1407   ALT 19 12/09/2017 1407   ALKPHOS 141 (H) 12/09/2017 1407   BILITOT <0.2 12/09/2017 1407      Component Value Date/Time   TSH 2.500 12/09/2017 1407   TSH 1.38 09/04/2010 0840    Ref. Range 12/09/2017 14:07  Vitamin D, 25-Hydroxy Latest Ref Range: 30.0 - 100.0 ng/mL 37.6    ASSESSMENT AND PLAN: Vitamin D deficiency  Other constipation  Class 1 obesity with serious comorbidity and body mass index (BMI) of 32.0 to 32.9 in adult, unspecified obesity type  PLAN: Vitamin D Deficiency Wanda Burns was informed that low vitamin D levels contributes to fatigue and are associated with obesity, breast, and colon cancer. She agrees to continue to take prescription Vit D @50 ,000 IU every week and will follow up for routine testing of vitamin D, at least 2-3 times per year. She was informed of the risk of over-replacement of vitamin D and agrees to not increase her dose unless she discusses this with [place] first. Agrees to follow up with our clinic as directed.   Constipation Wanda Burns was informed decrease bowel movement frequency is normal while losing weight, but stools should not be hard or painful. She was advised to increase her H20 intake and work on increasing her fiber intake. High fiber foods were discussed today. She agrees to use miralax as needed.   Obesity Wanda Burns is currently in the action stage of change. As such, her goal is to continue with weight loss efforts She has agreed to follow the Category 1 plan +100 calories Wanda Burns has not been prescribed exercise at this time. We discussed the following Behavioral  Modification Strategies today: increasing lean protein intake and planning for success.    Wanda Burns has agreed to follow up with our clinic in 2-3 weeks. She was informed of the importance of frequent follow up visits to maximize her success with intensive lifestyle modifications for her multiple health conditions.   OBESITY BEHAVIORAL INTERVENTION VISIT  Today's visit was # 5   Starting weight: 194 lb Starting date: 12/09/17 Today's weight : Weight: 188 lb (85.3 kg)  Today's date: 02/17/2018 Total lbs lost to date: 6 lb At least 15 minutes were spent on discussing the following behavioral intervention visit.   ASK: We discussed the diagnosis of obesity with Wanda Burns today and Wanda Burns agreed to give [place] permission to discuss obesity behavioral modification therapy today.  ASSESS: Wanda Burns has the diagnosis of obesity and her BMI today is 32.25 Wanda Burns is in the action stage of change   ADVISE: Wanda Burns was educated on the multiple health risks of obesity as well as the benefit of weight loss to improve her health. She was advised of the need for long term treatment and the importance of lifestyle modifications to improve her current health and to decrease her risk of future health problems.  AGREE: Multiple dietary modification options and treatment options were discussed and  Wanda Burns agreed to follow the recommendations documented in the above note.  ARRANGE: Wanda Burns was educated on the importance of frequent visits to treat obesity as outlined per CMS and USPSTF guidelines and agreed to schedule her next follow up appointment today.  I, Renee Ramus, am acting as Location manager for [employer], FNP-C.  I have reviewed the above documentation for accuracy and completeness, and I agree with the above.  - Semya Klinke, FNP-C.

## 2018-03-16 NOTE — Progress Notes (Signed)
Office: [phone]  /  Fax: [phone]   Date: March 17, 2018 Time Seen: 7:58am Duration: 30 minutes Provider: Glennie Isle, Psy.D. Type of Session: Individual Therapy  Type of Contact: Face-to-face   HPI: Laurawas referred by Dr. Lillia Carmel to positive depression screen.She was seen for an initial appointment by this provider on December 24, 2017.Per the note for the initial visit withDr. Desmond Dike December 09, 2017,"Dariona is retired and she lives alone. She has a history of sneaking food due to embarrassment and feelings of guilt when she eats. Lillyanne "gives up" on healthy eating, but she is unsure if she has an all or nothing personality."Morene's Food and Mood (modified PHQ-9) score was15.In addition, per the note for the initial visit withDr. Desmond Dike December 09, 2017,Lakya described herself as a picky eater and does not like to eat healthier foods.Sariya started gaining weight at age 58 and her heaviest weight ever was 200 pounds.During her initial appointment with Dr. Charisse Klinefelter experiencing the following:significant food cravings issues; snacking frequently in the evenings; skipping meals frequently; frequently drinking liquids with calories; problems with excessive hunger; binge eating behaviors; and struggling with emotional eating.During the initial appointment with this provider, Skylene reported experiencing difficulty "reducing" her weight, which "stems off" to medical difficulties (e.g., diabetes). She also reported her weight impacts her mood. Currently, Sunny stated she takes two antidepressants, but is unsure if they are helping. She reported a history of experiencing emotional eating with the last episode beingthe week prior to the initial appointment with this provider.Teiara reported a history of binge eating, and added, "If I don't have it in the house, I won't do it." She could not recall the last time she engaged in binge eating.  Lidia denied a history of purging and engagement in other compensatory strategies. She denied ever being diagnosed with an eating disorder. Moreover, Rema reported emotional eating started in childhood; however, she was skinny and did not start gaining weight until her 15s. Furthermore, Laurawas asked to complete a questionnaire assessing various behaviors related to emotional eating. Lauraendorsed the following: experience food cravings on a regular basis, eat certain foods when you are anxious, stressed, depressed, or your feelings are hurt, find food is comforting to you, overeat when you are angry or upset, not worry about what you eat when you are in a good mood, overeat when you are alone, but eat much less when you are with other people and eat as a reward. During today's appointment, Ruhani reported a fluctuation in her appetite and dicussed "indulging" during the holidays.   Session Content: Session focused on the following treatment goal: decrease emotional eating. The session was initiated with the administration of the PHQ-9 and GAD-7, as well as a brief check-in. Parthenia discussed recent events, including holidays. Frida noted she "lost control" with certain foods during the holidays. She noted a belief of weight gain due to the holiday eating, and discussed a fluctuation in appetite. This provider and Breana dicussed the overall reduction in PHQ-9 and GAD-7 since the onset of treatment. Jackye described being "more conscious of thoughts" has assisted in reducing symptomatology. This was explored further, and Kimoni shared a reduction in emotional eating as coping has increased. Moreover, she discussed engaging in pleasurable activities, which has contributed to an increase in motivation. Nevertheless, Latajah stated the "clutter" in her home is impacting her "creativity." Thus, session focused on problem solving to assist with developing short-term and long-term goals. In addition, this provider engaged  Ab[place]ola in cognitive  reframing. Session then focused on mindfulness. Psychoeducation regarding the wise, emotional, and reasonable mind was provided to further assist with mindfulness. A handout was provided. Session concluded with a discussion regarding a referral for longer-term services. Teresia reported a plan to "think about" the option and inform this provider at the next appointment. Lonnetta was receptive to today's session as evidenced by openness to sharing, responsiveness to feedback, and engagement in problem solving.   Mental Status Examination: Jahni arrived on time for the appointment. She presented as appropriately dressed and groomed. Janalee appeared her stated age and demonstrated adequate orientation to time, place, person, and purpose of the appointment. She also demonstrated appropriate eye contact. No psychomotor abnormalities or behavioral peculiarities noted. Her mood was euthymic with congruent affect. Her thought processes were logical, linear, and goal-directed. No hallucinations, delusions, bizarre thinking or behavior reported or observed. Judgment, insight, and impulse control appeared to be grossly intact. There was no evidence of paraphasias (i.e., errors in speech, gross mispronunciations, and word substitutions), repetition deficits, or disturbances in volume or prosody (i.e., rhythm and intonation). There was no evidence of attention or memory impairments. Hollis denied current suicidal and homicidal ideation, intent or plan.  Structured Assessment Results: The Patient Health Questionnaire-9 (PHQ-9) is a self-report measure that assesses symptoms and severity of depression over the course of the last two weeks. Joannah obtained a score of 4 suggesting minimal depression. Marypat finds the endorsed symptoms to be not difficult at all. Depression screen PHQ 2/9 03/17/2018  Decreased Interest 1  Down, Depressed, Hopeless 1  PHQ - 2 Score 2  Altered sleeping 1  Tired, decreased energy 0    Change in appetite 1  Feeling bad or failure about yourself  0  Trouble concentrating 0  Moving slowly or fidgety/restless 0  Suicidal thoughts 0  PHQ-9 Score 4  Difficult doing work/chores -   The Generalized Anxiety Disorder-7 (GAD-7) is a brief self-report measure that assesses symptoms of anxiety over the course of the last two weeks. Armandina obtained a score of 1 suggesting minimal anxiety. GAD 7 : Generalized Anxiety Score 03/17/2018  Nervous, Anxious, on Edge 1  Control/stop worrying 0  Worry too much - different things 0  Trouble relaxing 0  Restless 0  Easily annoyed or irritable 0  Afraid - awful might happen 0  Total GAD 7 Score 1  Anxiety Difficulty Not difficult at all   Interventions:  Administration of PHQ-9 and GAD-7 for symptom monitoring Review of content from the previous session Empathic reflections and validation Problem solving Discussed option for a referral for longer-term therapeutic services Positive reinforcement Cognitive Reframing Brief chart review  Psychoeducation regarding wise, reasonable, and emotional mind  DSM-5 Diagnosis: 296.32 (F33.1) Major Depressive Disorder, Recurrent Episode, Moderate  Treatment Goal & Progress: During the initial appointment with this provider, the following treatment goal was established: decrease emotional eating. Coni has demonstrated progress in her goal as evidenced by increased awareness of hunger patterns and triggers for emotional eating. Previously, Haya has indicated an overall decrease in emotional eating, and currently she continues to engage in learned skills.   Plan: Jaice continues to appear able and willing to participate as evidenced by engagement in reciprocal conversation, and asking questions for clarification as appropriate. The next appointment will be scheduled in two weeks. The next session will focus on reviewing learned skills, and termination.

## 2018-03-17 ENCOUNTER — Encounter (INDEPENDENT_AMBULATORY_CARE_PROVIDER_SITE_OTHER): Payer: Self-pay | Admitting: Family Medicine

## 2018-03-17 ENCOUNTER — Ambulatory Visit (INDEPENDENT_AMBULATORY_CARE_PROVIDER_SITE_OTHER): Payer: PPO | Admitting: Psychology

## 2018-03-17 ENCOUNTER — Ambulatory Visit (INDEPENDENT_AMBULATORY_CARE_PROVIDER_SITE_OTHER): Payer: PPO | Admitting: Family Medicine

## 2018-03-17 VITALS — BP 122/79 | HR 83 | Temp 98.2°F | Ht 64.0 in | Wt 187.0 lb

## 2018-03-17 DIAGNOSIS — E669 Obesity, unspecified: Secondary | ICD-10-CM | POA: Diagnosis not present

## 2018-03-17 DIAGNOSIS — E7849 Other hyperlipidemia: Secondary | ICD-10-CM | POA: Diagnosis not present

## 2018-03-17 DIAGNOSIS — Z6832 Body mass index (BMI) 32.0-32.9, adult: Secondary | ICD-10-CM

## 2018-03-17 DIAGNOSIS — R7303 Prediabetes: Secondary | ICD-10-CM

## 2018-03-17 DIAGNOSIS — F331 Major depressive disorder, recurrent, moderate: Secondary | ICD-10-CM | POA: Diagnosis not present

## 2018-03-17 DIAGNOSIS — E559 Vitamin D deficiency, unspecified: Secondary | ICD-10-CM | POA: Diagnosis not present

## 2018-03-17 NOTE — Progress Notes (Signed)
Office: [phone]  /  Fax: [phone]   HPI:   Chief Complaint: OBESITY Wanda Burns is here to discuss her progress with her obesity treatment plan. She is on the Category 1 plan + 100 calories and is following her eating plan approximately 80 % of the time. She states she is exercising 0 minutes 0 times per week. Wanda Burns was off the plan over the holidays and has not followed up for 1 month. She is now back on the plan. She is not having much of an appetite because of boredom with food.  Her weight is 187 lb (84.8 kg) today and has had a weight loss of 1 pound over a period of 4 weeks since her last visit. She has lost 7 lbs since starting treatment with [place].  Pre-Diabetes Wanda Burns has a diagnosis of pre-diabetes based on her elevated Hgb A1c and was informed this puts her at greater risk of developing diabetes. Her last A1c was 6.2 on 12/09/17. She is taking metformin currently and continues to work on diet and exercise to decrease risk of diabetes. She denies nausea, vomiting, diarrhea, or hypoglycemia.  Vitamin D deficiency Wanda Burns has a diagnosis of vitamin D deficiency. She is currently taking vit D and is not at goal. Her last vitamin D level was 37.6 on 12/09/17. She denies nausea, vomiting, or muscle weakness.  Hyperlipidemia Wanda Burns has hyperlipidemia and has been trying to improve her cholesterol levels with intensive lifestyle modification including a low saturated fat diet, exercise and weight loss. She is not on a statin and her last LDL was 148 on 12/09/17 and her HDL and triglycerides were within normal limits. Her ASCVD risk score is 5.4%.  ASSESSMENT AND PLAN:  Prediabetes - Plan: Hemoglobin A1c, Insulin, random, Comprehensive metabolic panel  Vitamin D deficiency - Plan: VITAMIN D 25 Hydroxy (Vit-D Deficiency, Fractures)  Other hyperlipidemia - Plan: Lipid Panel With LDL/HDL Ratio  Class 1 obesity with serious comorbidity and body mass index (BMI) of 32.0 to 32.9 in adult,  unspecified obesity type  PLAN:  Pre-Diabetes Wanda Burns will continue to work on weight loss, exercise, and decreasing simple carbohydrates in her diet to help decrease the risk of diabetes. She was informed that eating too many simple carbohydrates or too many calories at one sitting increases the likelihood of GI side effects. Wanda Burns agreed to continue her meal plan and metformin and a prescription was not written today. We will check her A1c, fasting glucose and Insulin today. Wanda Burns agreed to follow up with [place] as directed to monitor her progress.  Vitamin D Deficiency Wanda Burns was informed that low vitamin D levels contributes to fatigue and are associated with obesity, breast, and colon cancer. She agrees to continue to take prescription Vit D @50 ,000 IU every week and will follow up for routine testing of vitamin D, at least 2-3 times per year. She was informed of the risk of over-replacement of vitamin D and agrees to not increase her dose unless she discusses this with [place] first. We will check her vitamin D level today and she agrees to follow up as directed.  Hyperlipidemia Wanda Burns was informed of the American Heart Association Guidelines emphasizing intensive lifestyle modifications as the first line treatment for hyperlipidemia. We discussed many lifestyle modifications today in depth, and Wanda Burns will continue to work on decreasing saturated fats such as fatty red meat, butter and many fried foods. She will also increase vegetables and lean protein in her diet and continue to work on exercise and  weight loss efforts. We will check her FLP today and Wanda Burns agrees to follow up as directed.  Obesity Wanda Burns is currently in the action stage of change. As such, her goal is to continue with weight loss efforts She has agreed to follow the Category 1 plan and to keep a food journal with 300 to 400 calories and 35+ grams of protein for supper. Wanda Burns has not been prescribed exercise at this time. We discussed  the following Behavioral Modification Strategies today: work on meal planning and easy cooking plans, ways to avoid boredom eating, planning for success, and keeping a strict food journal. Wanda Burns has agreed to follow up with our clinic in 2 weeks. She was informed of the importance of frequent follow up visits to maximize her success with intensive lifestyle modifications for her multiple health conditions.  ALLERGIES: Allergies  Allergen Reactions  . Codeine Itching  . Prednisone Other (See Comments)    Hyper, irritable    MEDICATIONS: Current Outpatient Medications on File Prior to Visit  Medication Sig Dispense Refill  . buPROPion (WELLBUTRIN XL) 300 MG 24 hr tablet Take 300 mg by mouth daily.    Marland Kitchen LORazepam (ATIVAN) 1 MG tablet Take 0.5 mg by mouth 2 (two) times daily as needed.     . metFORMIN (GLUCOPHAGE) 500 MG tablet TAKE 1 TABLET BY MOUTH DAILY WITH BREAKFAST 90 tablet 0  . PARoxetine (PAXIL) 10 MG tablet Take 10 mg by mouth daily.    . Vitamin D, Ergocalciferol, (DRISDOL) 1.25 MG (50000 UT) CAPS capsule Take 1 capsule (50,000 Units total) by mouth every 7 (seven) days. 12 capsule 0   No current facility-administered medications on file prior to visit.     PAST MEDICAL HISTORY: Past Medical History:  Diagnosis Date  . Anxiety   . Arthritis    fingers  . Chest pain   . Constipation   . Depression   . Diverticulosis    mild on colonoscopy 2012  . Dyspnea on exertion   . Hay fever   . History of chicken pox   . History of smoking    quit 11/2010  . HLD (hyperlipidemia)   . Lactose intolerance   . Leg edema   . Obesity   . Rheumatoid arthritis ([place])   . Swallowing difficulty     PAST SURGICAL HISTORY: Past Surgical History:  Procedure Laterality Date  . abd [place]  2006   nothing acute, no gallbladder stones  . bilateral tubal    . BUNIONECTOMY Right   . COLONOSCOPY  2012   WNL, mild diverticulosis, rpt 10 yrs  . LIPOMA EXCISION    . LIPOMA EXCISION Right  10/02/2017   Procedure: EXCISION OF SUBCUTANEOUS LIPOMA OF RIGHT UPPER LATERAL ARM;  Surgeon: Donnie Mesa, MD;  Location: [facility];  Service: General;  Laterality: Right;  . TUBAL LIGATION    . VAGINAL HYSTERECTOMY  1990    SOCIAL HISTORY: Social History   Tobacco Use  . Smoking status: Former Smoker    Packs/day: 1.00    Types: Cigarettes    Last attempt to quit: 11/10/2010    Years since quitting: 7.3  . Smokeless tobacco: Never Used  . Tobacco comment: Less than 1/2 PPD  Substance Use Topics  . Alcohol use: Yes    Alcohol/week: 1.0 standard drinks    Types: 1 Standard drinks or equivalent per week    Comment: social  . Drug use: No    FAMILY HISTORY: Family History  Problem Relation Age of Onset  . Diabetes Mother   . Hypertension Mother   . Emphysema Mother   . Coronary artery disease Mother 23  . Hyperlipidemia Mother   . Depression Mother   . Anxiety disorder Mother   . Alcohol abuse Mother   . Obesity Mother   . Coronary artery disease Father 59       MI death  . Alcohol abuse Father   . Sudden death Father   . Diabetes Brother   . Diabetes Maternal Aunt   . Colon cancer Maternal Aunt   . Colon cancer Maternal Uncle 35  . Diabetes Maternal Uncle   . Stroke Maternal Grandmother     ROS: Review of Systems  Constitutional: Positive for weight loss.  Gastrointestinal: Negative for diarrhea, nausea and vomiting.  Musculoskeletal:       Negative for muscle weakness.  Endo/Heme/Allergies:       Negative for hypoglycemia.    PHYSICAL EXAM: Blood pressure 122/79, pulse 83, temperature 98.2 F (36.8 C), temperature source Oral, height 5\' 4"  (1.626 m), weight 187 lb (84.8 kg), SpO2 95 %. Body mass index is 32.1 kg/m. Physical Exam Vitals signs reviewed.  Constitutional:      Appearance: Normal appearance. She is obese.  Cardiovascular:     Rate and Rhythm: Normal rate.  Pulmonary:     Effort: Pulmonary effort is normal.    Musculoskeletal: Normal range of motion.  Skin:    General: Skin is warm and dry.  Neurological:     Mental Status: She is alert and oriented to person, place, and time.  Psychiatric:        Mood and Affect: Mood normal.        Behavior: Behavior normal.     RECENT LABS AND TESTS: BMET    Component Value Date/Time   NA 140 12/09/2017 1407   K 4.6 12/09/2017 1407   CL 101 12/09/2017 1407   CO2 21 12/09/2017 1407   GLUCOSE 76 12/09/2017 1407   GLUCOSE 83 09/04/2010 0840   BUN 19 12/09/2017 1407   CREATININE 0.88 12/09/2017 1407   CALCIUM 8.9 12/09/2017 1407   GFRNONAA 70 12/09/2017 1407   GFRAA 80 12/09/2017 1407   Lab Results  Component Value Date   HGBA1C 6.2 (H) 12/09/2017   Lab Results  Component Value Date   INSULIN 14.7 12/09/2017   CBC    Component Value Date/Time   WBC 5.8 12/09/2017 1407   WBC 8.8 09/04/2010 0840   RBC 4.91 12/09/2017 1407   RBC 4.45 09/04/2010 0840   HGB 12.8 12/09/2017 1407   HCT 39.7 12/09/2017 1407   PLT 271.0 09/04/2010 0840   MCV 81 12/09/2017 1407   MCH 26.1 (L) 12/09/2017 1407   MCHC 32.2 12/09/2017 1407   MCHC 33.9 09/04/2010 0840   RDW 16.7 (H) 12/09/2017 1407   LYMPHSABS 1.5 12/09/2017 1407   MONOABS 0.5 09/04/2010 0840   EOSABS 0.0 12/09/2017 1407   BASOSABS 0.0 12/09/2017 1407   Iron/TIBC/Ferritin/ %Sat No results found for: IRON, TIBC, FERRITIN, IRONPCTSAT Lipid Panel     Component Value Date/Time   CHOL 222 (H) 12/09/2017 1407   TRIG 101 12/09/2017 1407   HDL 54 12/09/2017 1407   CHOLHDL 6 09/04/2010 0840   VLDL 26.8 09/04/2010 0840   LDLCALC 148 (H) 12/09/2017 1407   LDLDIRECT 146.2 09/04/2010 0840   Hepatic Function Panel     Component Value Date/Time   PROT 6.7 12/09/2017 1407  ALBUMIN 4.4 12/09/2017 1407   AST 18 12/09/2017 1407   ALT 19 12/09/2017 1407   ALKPHOS 141 (H) 12/09/2017 1407   BILITOT <0.2 12/09/2017 1407      Component Value Date/Time   TSH 2.500 12/09/2017 1407   TSH 1.38  09/04/2010 0840   Results for ALEXSA, FLAUM (MRN [id number]) as of 03/17/2018 16:30  Ref. Range 12/09/2017 14:07  Vitamin D, 25-Hydroxy Latest Ref Range: 30.0 - 100.0 ng/mL 37.6    OBESITY BEHAVIORAL INTERVENTION VISIT  Today's visit was # 6   Starting weight: 194 lbs Starting date: 12/09/17 Today's weight : Weight: 187 lb (84.8 kg)  Today's date: 03/17/2018 Total lbs lost to date: 7 At least 15 minutes were spent on discussing the following behavioral intervention visit.  ASK: We discussed the diagnosis of obesity with Wanda Burns today and Kiante agreed to give [place] permission to discuss obesity behavioral modification therapy today.  ASSESS: Cynia has the diagnosis of obesity and her BMI today is 32.0. Jaida is in the action stage of change.   ADVISE: Kierstan was educated on the multiple health risks of obesity as well as the benefit of weight loss to improve her health. She was advised of the need for long term treatment and the importance of lifestyle modifications to improve her current health and to decrease her risk of future health problems.  AGREE: Multiple dietary modification options and treatment options were discussed and Marlaya agreed to follow the recommendations documented in the above note.  ARRANGE: Naylani was educated on the importance of frequent visits to treat obesity as outlined per CMS and USPSTF guidelines and agreed to schedule her next follow up appointment today.  I, Marcille Blanco, am acting as Location manager for [employer], FNP-C.  I have reviewed the above documentation for accuracy and completeness, and I agree with the above.  - Chenille Toor, FNP-C.

## 2018-03-18 ENCOUNTER — Encounter (INDEPENDENT_AMBULATORY_CARE_PROVIDER_SITE_OTHER): Payer: Self-pay | Admitting: Family Medicine

## 2018-03-18 DIAGNOSIS — E6609 Other obesity due to excess calories: Secondary | ICD-10-CM | POA: Insufficient documentation

## 2018-03-18 DIAGNOSIS — R7303 Prediabetes: Secondary | ICD-10-CM | POA: Insufficient documentation

## 2018-03-18 DIAGNOSIS — Z6831 Body mass index (BMI) 31.0-31.9, adult: Secondary | ICD-10-CM

## 2018-03-18 DIAGNOSIS — E669 Obesity, unspecified: Secondary | ICD-10-CM | POA: Insufficient documentation

## 2018-03-18 LAB — COMPREHENSIVE METABOLIC PANEL
ALT: 16 IU/L (ref 0–32)
AST: 13 IU/L (ref 0–40)
Albumin/Globulin Ratio: 1.9 (ref 1.2–2.2)
Albumin: 4.8 g/dL (ref 3.6–4.8)
Alkaline Phosphatase: 148 IU/L — ABNORMAL HIGH (ref 39–117)
BUN/Creatinine Ratio: 22 (ref 12–28)
BUN: 22 mg/dL (ref 8–27)
Bilirubin Total: <0.2 mg/dL (ref 0.0–1.2)
CO2: 20 mmol/L (ref 20–29)
Calcium: 9.5 mg/dL (ref 8.7–10.3)
Chloride: 99 mmol/L (ref 96–106)
Creatinine, Ser: 1.02 mg/dL — ABNORMAL HIGH (ref 0.57–1.00)
GFR calc Af Amer: 67 mL/min/{1.73_m2} (ref >59)
GFR calc non Af Amer: 58 mL/min/{1.73_m2} — ABNORMAL LOW (ref >59)
Globulin, Total: 2.5 g/dL (ref 1.5–4.5)
Glucose: 88 mg/dL (ref 65–99)
Potassium: 4.8 mmol/L (ref 3.5–5.2)
Sodium: 137 mmol/L (ref 134–144)
Total Protein: 7.3 g/dL (ref 6.0–8.5)

## 2018-03-18 LAB — INSULIN, RANDOM: INSULIN: 13.4 u[IU]/mL (ref 2.6–24.9)

## 2018-03-18 LAB — LIPID PANEL WITH LDL/HDL RATIO
Cholesterol, Total: 253 mg/dL — ABNORMAL HIGH (ref 100–199)
HDL: 64 mg/dL (ref >39)
LDL Calculated: 162 mg/dL — ABNORMAL HIGH (ref 0–99)
LDl/HDL Ratio: 2.5 ratio (ref 0.0–3.2)
Triglycerides: 137 mg/dL (ref 0–149)
VLDL Cholesterol Cal: 27 mg/dL (ref 5–40)

## 2018-03-18 LAB — HEMOGLOBIN A1C
Est. average glucose Bld gHb Est-mCnc: 123 mg/dL
Hgb A1c MFr Bld: 5.9 % — ABNORMAL HIGH (ref 4.8–5.6)

## 2018-03-18 LAB — VITAMIN D 25 HYDROXY (VIT D DEFICIENCY, FRACTURES): Vit D, 25-Hydroxy: 52.2 ng/mL (ref 30.0–100.0)

## 2018-03-26 NOTE — Progress Notes (Signed)
Office: [phone]  /  Fax: [phone]    Date: March 31, 2018   Time Seen: 9:30am Duration: 33 minutes Provider: Glennie Isle, Psy.D. Type of Session: Individual Therapy  Type of Contact: Face-to-face  HPI: Laurawas referred by Dr. Lillia Carmel to positive depression screen.She was seen for an initial appointment by this provider on December 24, 2017.Per the note for the initial visit withDr. Desmond Dike December 09, 2017,"Irean is retired and she lives alone. She has a history of sneaking food due to embarrassment and feelings of guilt when she eats. Libertie "gives up" on healthy eating, but she is unsure if she has an all or nothing personality."Britanny's Food and Mood (modified PHQ-9) score was15.In addition, per the note for the initial visit withDr. Desmond Dike December 09, 2017,Maudell described herself as a picky eater and does not like to eat healthier foods.Aime started gaining weight at age 55 and her heaviest weight ever was 200 pounds.During her initial appointment with Dr. Charisse Klinefelter experiencing the following:significant food cravings issues; snacking frequently in the evenings; skipping meals frequently; frequently drinking liquids with calories; problems with excessive hunger; binge eating behaviors; and struggling with emotional eating.During the initial appointment with this provider, Sidni reported experiencing difficulty "reducing" her weight, which "stems off" to medical difficulties (e.g., diabetes). She also reported her weight impacts her mood. Currently, Hawa stated she takes two antidepressants, but is unsure if they are helping. She reported a history of experiencing emotional eating with the last episode beingthe week prior to the initial appointment with this provider.Jeanenne reported a history of binge eating, and added, "If I don't have it in the house, I won't do it." She could not recall the last time she engaged in binge eating.  Akeila denied a history of purging and engagement in other compensatory strategies. She denied ever being diagnosed with an eating disorder. Moreover, Ellisa reported emotional eating started in childhood; however, she was skinny and did not start gaining weight until her 85s. Furthermore, Laurawas asked to complete a questionnaire assessing various behaviors related to emotional eating. Lauraendorsed the following: experience food cravings on a regular basis, eat certain foods when you are anxious, stressed, depressed, or your feelings are hurt, find food is comforting to you, overeat when you are angry or upset, not worry about what you eat when you are in a good mood, overeat when you are alone, but eat much less when you are with other people and eat as a reward.  During today's appointment, Twanda reported a decrease in emotional eating; however, noted she is not getting full when eating.   Session Content: Session focused on reviewing learned skills, and termination. The session was initiated with the administration of the PHQ-9 and GAD-7, as well as a brief check-in. Jameia discussed engaging in positive self-talk, which has helped improve mood. Regarding eating, Sahirah noted, "It's going good," but discussed disappointment with herself as it relates to holiday eating. This provider assisted with processing thoughts and feelings associated with the aforementioned. When asked about emotional eating, Zineb noted a decrease in emotional eating but added, "It's like I can't get full." This was explored. This provider discussed choosing snacks that have protein, as they assist with fullness. She was also encouraged to speak with Jake Bathe, FNP during their appointment today. Troyce agreed. Regarding overall wellness, Nadira noted, "I feel so much better than when we started. I was a basket case." However, she discussed stressors related to making decisions about her future (I.e., moving). As  such, the option of  referral for longer-term therapeutic services was discussed again. Briena declined a referral at this time, and added, "I've gotten better results with you then with people in the past." Regarding marijuana use, Dixie indicated she continues to abstain. Moreover, mindfulness was discussed. China shared she has been practicing "a little." This was explored, and Naw indicated experiencing difficulty with "quieting" her mind and disclosed experiencing laziness. As such, this provider discussed the utilization of YouTube for mindfulness exercises. A handout for an additional exercise was also provided. Eddie was receptive to today's session as evidenced by openness to sharing, responsiveness to feedback, and willingness to continue practicing mindfulness.  Mental Status Examination: Jenae arrived on time for the appointment. She presented as appropriately dressed and groomed. Rhiana appeared her stated age and demonstrated adequate orientation to time, place, person, and purpose of the appointment. She also demonstrated appropriate eye contact. No psychomotor abnormalities or behavioral peculiarities noted. Her mood was euthymic with congruent affect. Her thought processes were logical, linear, and goal-directed. No hallucinations, delusions, bizarre thinking or behavior reported or observed. Judgment, insight, and impulse control appeared to be grossly intact. There was no evidence of paraphasias (i.e., errors in speech, gross mispronunciations, and word substitutions), repetition deficits, or disturbances in volume or prosody (i.e., rhythm and intonation). There was no evidence of attention or memory impairments. Jaelle denied current suicidal and homicidal ideation, plan and intent.   Structured Assessment Results: The Patient Health Questionnaire-9 (PHQ-9) is a self-report measure that assesses symptoms and severity of depression over the course of the last two weeks. Lynsi obtained a score of zero. Depression  screen [facility] 2/9 03/31/2018  Decreased Interest 0  Down, Depressed, Hopeless 0  PHQ - 2 Score 0  Altered sleeping 0  Tired, decreased energy 0  Change in appetite 0  Feeling bad or failure about yourself  0  Trouble concentrating 0  Moving slowly or fidgety/restless 0  Suicidal thoughts 0  PHQ-9 Score 0  Difficult doing work/chores -   The Generalized Anxiety Disorder-7 (GAD-7) is a brief self-report measure that assesses symptoms of anxiety over the course of the last two weeks. Cynai obtained a score of 1 suggesting minimal anxiety. GAD 7 : Generalized Anxiety Score 03/31/2018  Nervous, Anxious, on Edge 1  Control/stop worrying 0  Worry too much - different things 0  Trouble relaxing 0  Restless 0  Easily annoyed or irritable 0  Afraid - awful might happen 0  Total GAD 7 Score 1  Anxiety Difficulty Not difficult at all   Interventions:  Administration of PHQ-9 and GAD-7 for symptom monitoring Empathic reflections and validation Processing thoughts and feelings Reviewed learned skills Positive reinforcement Brief chart review  DSM-5 Diagnosis: 296.32 (F33.1) Major Depressive Disorder, Recurrent Episode, Moderate  Treatment Goal & Progress: During the initial appointment with this provider, the following treatment goal was established: decrease emotional eating. Nafeesah has demonstrated progress in her goal as evidenced by increased awareness of hunger patterns and triggers for emotional eating. During today's appointment, she continued to report a reduction in emotional eating. Since the onset of treatment, there was also a reduction in symptomatology as evidenced by Shalyn's self-report on the PHQ-9 and GAD-7.  Plan: Today was Carlyle's last appointment with this provider. The option of a referral for longer-term therapeutic services was dicussed, but Nerine declined a referral at this time. She acknowledged understanding that she may request a referral in the future.

## 2018-03-31 ENCOUNTER — Encounter (INDEPENDENT_AMBULATORY_CARE_PROVIDER_SITE_OTHER): Payer: Self-pay | Admitting: Family Medicine

## 2018-03-31 ENCOUNTER — Ambulatory Visit (INDEPENDENT_AMBULATORY_CARE_PROVIDER_SITE_OTHER): Payer: PPO | Admitting: Family Medicine

## 2018-03-31 ENCOUNTER — Ambulatory Visit (INDEPENDENT_AMBULATORY_CARE_PROVIDER_SITE_OTHER): Payer: PPO | Admitting: Psychology

## 2018-03-31 VITALS — BP 119/77 | HR 75 | Temp 98.1°F | Ht 64.0 in | Wt 188.0 lb

## 2018-03-31 DIAGNOSIS — Z6832 Body mass index (BMI) 32.0-32.9, adult: Secondary | ICD-10-CM | POA: Diagnosis not present

## 2018-03-31 DIAGNOSIS — R7303 Prediabetes: Secondary | ICD-10-CM | POA: Diagnosis not present

## 2018-03-31 DIAGNOSIS — F331 Major depressive disorder, recurrent, moderate: Secondary | ICD-10-CM

## 2018-03-31 DIAGNOSIS — E669 Obesity, unspecified: Secondary | ICD-10-CM

## 2018-03-31 DIAGNOSIS — E559 Vitamin D deficiency, unspecified: Secondary | ICD-10-CM

## 2018-03-31 MED ORDER — METFORMIN HCL 500 MG PO TABS: 500.0000 mg | ORAL_TABLET | Freq: Two times a day (BID) | ORAL | 0 refills | Status: DC

## 2018-03-31 NOTE — Progress Notes (Signed)
Office: [phone]  /  Fax: [phone]   HPI:   Chief Complaint: OBESITY Wanda Burns is here to discuss her progress with her obesity treatment plan. She is on the Category 1 plan and journal 300-400 calories and 35 grams of protein with supper. She is following her eating plan approximately 80-85 % of the time. She states she is exercising 0 minutes 0 times per week. Notnamed is bored with food options. Her weight is 188 lb (85.3 kg) today and has gained1 lbs since her last visit. She has lost 6 lbs since starting treatment with [place].  Pre-Diabetes [name] has a diagnosis of prediabetes based on her elevated Hgb A1c and was informed this puts her at greater risk of developing diabetes. She is taking metformin currently and continues to work on diet and exercise to decrease risk of diabetes. She denies nausea or hypoglycemia. She is positive for polyphagia after lunch. Her A1C has decreased to 5.9 from 6.3.   Vitamin D deficiency [name] has a diagnosis of vitamin D deficiency. She is currently taking prescription Vit D and denies nausea, vomiting or muscle weakness. Her Vit D level is at goal but not over-replaced.  ASSESSMENT AND PLAN:  Prediabetes - Plan: metFORMIN (GLUCOPHAGE) 500 MG tablet  Vitamin D deficiency  Class 1 obesity with serious comorbidity and body mass index (BMI) of 32.0 to 32.9 in adult, unspecified obesity type  PLAN:  Pre-Diabetes [name] will continue to work on weight loss, exercise, and decreasing simple carbohydrates in her diet to help decrease the risk of diabetes. We dicussed increasing metformin to 2 times daily, including benefits and risks. She was informed that eating too many simple carbohydrates or too many calories at one sitting increases the likelihood of GI side effects. [name] agrees increase her dose of metformin to 500 mg bid #60 with no refills with breakfast and lunch for now and a prescription was written today. [name] agrees to follow up with our clinic  in 2 weeks.  Vitamin D Deficiency Chole was informed that low vitamin D levels contributes to fatigue and are associated with obesity, breast, and colon cancer. She agrees to discontinue taking prescription Vit D. [name] agrees to start taking OTC Vit D3 2000 units daily. She will follow up for routine testing of vitamin D, at least 2-3 times per year. She was informed of the risk of over-replacement of vitamin D and agrees to not increase her dose unless she discusses this with [place] first. [name] agrees to follow up with our clinic in 2 weeks.   Obesity [name] is currently in the action stage of change. As such, her goal is to continue with weight loss efforts She has agreed to follow the Category 1 plan +100 calories and add a salad with 10 calorie dressing or non starchy vegetables. We discussed trying different snacks to reduce boredom. A handout on Protein content was given today. Addi has not been prescribed exercise at this time. We discussed the following Behavioral Modification Strategies today: increasing vegetables, work on meal planning and easy cooking plans better, snacking choices and planning for success.  [name] has agreed to follow up with our clinic in 2 weeks. She was informed of the importance of frequent follow up visits to maximize her success with intensive lifestyle modifications for her multiple health conditions.  ALLERGIES: Allergies  Allergen Reactions  . Codeine Itching  . Prednisone Other (See Comments)    Hyper, irritable    MEDICATIONS: Current Outpatient Medications on File Prior  to Visit  Medication Sig Dispense Refill  . buPROPion (WELLBUTRIN XL) 300 MG 24 hr tablet Take 300 mg by mouth daily.    Marland Kitchen LORazepam (ATIVAN) 1 MG tablet Take 0.5 mg by mouth 2 (two) times daily as needed.     Marland Kitchen PARoxetine (PAXIL) 10 MG tablet Take 10 mg by mouth daily.     No current facility-administered medications on file prior to visit.     PAST MEDICAL HISTORY: Past Medical  History:  Diagnosis Date  . Anxiety   . Arthritis    fingers  . Chest pain   . Constipation   . Depression   . Diverticulosis    mild on colonoscopy 2012  . Dyspnea on exertion   . Hay fever   . History of chicken pox   . History of smoking    quit 11/2010  . HLD (hyperlipidemia)   . Lactose intolerance   . Leg edema   . Obesity   . Rheumatoid arthritis ([place])   . Swallowing difficulty     PAST SURGICAL HISTORY: Past Surgical History:  Procedure Laterality Date  . abd [place]  2006   nothing acute, no gallbladder stones  . bilateral tubal    . BUNIONECTOMY Right   . COLONOSCOPY  2012   WNL, mild diverticulosis, rpt 10 yrs  . LIPOMA EXCISION    . LIPOMA EXCISION Right 10/02/2017   Procedure: EXCISION OF SUBCUTANEOUS LIPOMA OF RIGHT UPPER LATERAL ARM;  Surgeon: Donnie Mesa, MD;  Location: [facility];  Service: General;  Laterality: Right;  . TUBAL LIGATION    . VAGINAL HYSTERECTOMY  1990    SOCIAL HISTORY: Social History   Tobacco Use  . Smoking status: Former Smoker    Packs/day: 1.00    Types: Cigarettes    Last attempt to quit: 11/10/2010    Years since quitting: 7.3  . Smokeless tobacco: Never Used  . Tobacco comment: Less than 1/2 PPD  Substance Use Topics  . Alcohol use: Yes    Alcohol/week: 1.0 standard drinks    Types: 1 Standard drinks or equivalent per week    Comment: social  . Drug use: No    FAMILY HISTORY: Family History  Problem Relation Age of Onset  . Diabetes Mother   . Hypertension Mother   . Emphysema Mother   . Coronary artery disease Mother 4  . Hyperlipidemia Mother   . Depression Mother   . Anxiety disorder Mother   . Alcohol abuse Mother   . Obesity Mother   . Coronary artery disease Father 40       MI death  . Alcohol abuse Father   . Sudden death Father   . Diabetes Brother   . Diabetes Maternal Aunt   . Colon cancer Maternal Aunt   . Colon cancer Maternal Uncle 38  . Diabetes Maternal Uncle   . Stroke  Maternal Grandmother     ROS: Review of Systems  Constitutional: Negative for weight loss.  Gastrointestinal: Negative for nausea and vomiting.  Genitourinary:       Negative for polyuria  Musculoskeletal:       Negative for muscle weakness  Endo/Heme/Allergies: Negative for polydipsia.       Negative for hypoglycemia Positive for polyphagia    PHYSICAL EXAM: Blood pressure 119/77, pulse 75, temperature 98.1 F (36.7 C), temperature source Oral, height 5\' 4"  (1.626 m), weight 188 lb (85.3 kg), SpO2 95 %. Body mass index is 32.27 kg/m. Physical Exam  Vitals signs reviewed.  Constitutional:      Appearance: Normal appearance. She is obese.  Cardiovascular:     Rate and Rhythm: Normal rate.     Pulses: Normal pulses.  Pulmonary:     Effort: Pulmonary effort is normal.  Musculoskeletal: Normal range of motion.  Skin:    General: Skin is warm and dry.  Neurological:     Mental Status: She is alert and oriented to person, place, and time.  Psychiatric:        Mood and Affect: Mood normal.        Behavior: Behavior normal.     RECENT LABS AND TESTS: BMET    Component Value Date/Time   NA 137 03/17/2018 1031   K 4.8 03/17/2018 1031   CL 99 03/17/2018 1031   CO2 20 03/17/2018 1031   GLUCOSE 88 03/17/2018 1031   GLUCOSE 83 09/04/2010 0840   BUN 22 03/17/2018 1031   CREATININE 1.02 (H) 03/17/2018 1031   CALCIUM 9.5 03/17/2018 1031   GFRNONAA 58 (L) 03/17/2018 1031   GFRAA 67 03/17/2018 1031   Lab Results  Component Value Date   HGBA1C 5.9 (H) 03/17/2018   HGBA1C 6.2 (H) 12/09/2017   Lab Results  Component Value Date   INSULIN 13.4 03/17/2018   INSULIN 14.7 12/09/2017   CBC    Component Value Date/Time   WBC 5.8 12/09/2017 1407   WBC 8.8 09/04/2010 0840   RBC 4.91 12/09/2017 1407   RBC 4.45 09/04/2010 0840   HGB 12.8 12/09/2017 1407   HCT 39.7 12/09/2017 1407   PLT 271.0 09/04/2010 0840   MCV 81 12/09/2017 1407   MCH 26.1 (L) 12/09/2017 1407   MCHC  32.2 12/09/2017 1407   MCHC 33.9 09/04/2010 0840   RDW 16.7 (H) 12/09/2017 1407   LYMPHSABS 1.5 12/09/2017 1407   MONOABS 0.5 09/04/2010 0840   EOSABS 0.0 12/09/2017 1407   BASOSABS 0.0 12/09/2017 1407   Iron/TIBC/Ferritin/ %Sat No results found for: IRON, TIBC, FERRITIN, IRONPCTSAT Lipid Panel     Component Value Date/Time   CHOL 253 (H) 03/17/2018 1031   TRIG 137 03/17/2018 1031   HDL 64 03/17/2018 1031   CHOLHDL 6 09/04/2010 0840   VLDL 26.8 09/04/2010 0840   LDLCALC 162 (H) 03/17/2018 1031   LDLDIRECT 146.2 09/04/2010 0840   Hepatic Function Panel     Component Value Date/Time   PROT 7.3 03/17/2018 1031   ALBUMIN 4.8 03/17/2018 1031   AST 13 03/17/2018 1031   ALT 16 03/17/2018 1031   ALKPHOS 148 (H) 03/17/2018 1031   BILITOT <0.2 03/17/2018 1031      Component Value Date/Time   TSH 2.500 12/09/2017 1407   TSH 1.38 09/04/2010 0840     Ref. Range 03/17/2018 10:31  Vitamin D, 25-Hydroxy Latest Ref Range: 30.0 - 100.0 ng/mL 52.2     OBESITY BEHAVIORAL INTERVENTION VISIT  Today's visit was # 7   Starting weight: 194 lbs Starting date: 12/09/2017 Today's weight :: 188 lbs Today's date: 04/01/2018 Total lbs lost to date: 6 At least 15 minutes were spent on discussing the following behavioral intervention visit.   ASK: We discussed the diagnosis of obesity with Simonne Martinet today and Sholanda agreed to give [place] permission to discuss obesity behavioral modification therapy today.  ASSESS: Iana has the diagnosis of obesity and her BMI today is 32.25 Pegah is in the action stage of change   ADVISE: Perle was educated on the multiple health risks of obesity  as well as the benefit of weight loss to improve her health. She was advised of the need for long term treatment and the importance of lifestyle modifications to improve her current health and to decrease her risk of future health problems.  AGREE: Multiple dietary modification options and treatment options were  discussed and  Shawnay agreed to follow the recommendations documented in the above note.  ARRANGE: Shanora was educated on the importance of frequent visits to treat obesity as outlined per CMS and USPSTF guidelines and agreed to schedule her next follow up appointment today.  I, Tammy Wysor, am acting as Location manager for [employer].  I have reviewed the above documentation for accuracy and completeness, and I agree with the above.  -  , FNP-C.

## 2018-04-01 ENCOUNTER — Encounter (INDEPENDENT_AMBULATORY_CARE_PROVIDER_SITE_OTHER): Payer: Self-pay | Admitting: Family Medicine

## 2018-04-15 ENCOUNTER — Ambulatory Visit (INDEPENDENT_AMBULATORY_CARE_PROVIDER_SITE_OTHER): Payer: PPO | Admitting: Family Medicine

## 2018-04-15 ENCOUNTER — Encounter (INDEPENDENT_AMBULATORY_CARE_PROVIDER_SITE_OTHER): Payer: Self-pay | Admitting: Family Medicine

## 2018-04-15 VITALS — BP 106/66 | Temp 97.9°F | Ht 64.0 in | Wt 186.0 lb

## 2018-04-15 DIAGNOSIS — E7849 Other hyperlipidemia: Secondary | ICD-10-CM

## 2018-04-15 DIAGNOSIS — E669 Obesity, unspecified: Secondary | ICD-10-CM | POA: Diagnosis not present

## 2018-04-15 DIAGNOSIS — Z6832 Body mass index (BMI) 32.0-32.9, adult: Secondary | ICD-10-CM | POA: Diagnosis not present

## 2018-04-15 NOTE — Progress Notes (Signed)
Office: [phone]  /  Fax: [phone]   HPI:  Chief Complaint: OBESITY Wanda Burns is here to discuss her progress with her obesity treatment plan. She is on the Category 1 plan +100 calories and add a salad with 10 calorie dressing or non starchy vegetables and is following her eating plan approximately 80 % of the time. She states she is exercising 0 minutes 0 times per week. Wanda Burns does tend to eat to much cereal when she has cereal for breakfast. She is eating more protein than on her plan. She denies polyphagia Her weight is 186 lb (84.4 kg) today and has had a weight loss of 2 pounds over a period of 2 weeks since her last visit. She has lost 8 lbs since starting treatment with [place].  Hyperlipidemia Wanda Burns has hyperlipidemia and has been trying to improve her cholesterol levels with intensive lifestyle modification including a low saturated fat diet, exercise and weight loss. She is not on a statin and her last LDL162, her HDL 64 and her Triglycerides 137 on 03/17/18. Her ASCVD risk 4.1% over 10 years. A statin was not recommended to her. She denies chest pain or shortness of breath.  The 10-year ASCVD risk score Wanda Burns DC Brooke Bonito., et al., 2013) is: 4.1%   Values used to calculate the score:     Age: 52 years     Sex: Female     Is Non-Hispanic African American: No     Diabetic: No     Tobacco smoker: No     Systolic Blood Pressure: 993 mmHg     Is BP treated: No     HDL Cholesterol: 64 mg/dL     Total Cholesterol: 253 mg/dL   ASSESSMENT AND PLAN:  Other hyperlipidemia  Class 1 obesity with serious comorbidity and body mass index (BMI) of 32.0 to 32.9 in adult, unspecified obesity type  PLAN: Hyperlipidemia Bana was informed of the American Heart Association Guidelines emphasizing intensive lifestyle modifications as the first line treatment for hyperlipidemia. We discussed many lifestyle modifications today in depth, and Wanda Burns will continue to work on decreasing saturated fats such  as fatty red meat, butter and many fried foods. Shewill also increase vegetables and lean protein in her diet and continue to work on exercise and weight loss efforts.  Wanda Burns agrees to continue with her meal plan and follow up with our clinic in 2 weeks.   Obesity Wanda Burns is currently in the action stage of change. As such, her goal is to continue with weight loss efforts She has agreed to follow the Category 1 plan +100 calories and add a salad with 10 calorie dressing or non starchy vegetables. Wanda Burns has not been prescribed exercise. We discussed the following Behavioral Modification Strategies today: Increase H20, work on meal planning and easy cooking plans and planning for success. Wanda Burns was encouraged to eat only the amount of protein on the plan to limit calories.  Wanda Burns has agreed to follow up with our clinic in 2 weeks. She was informed of the importance of frequent follow up visits to maximize her success with intensive lifestyle modifications for her multiple health conditions.  ALLERGIES: Allergies  Allergen Reactions  . Codeine Itching  . Prednisone Other (See Comments)    Hyper, irritable    MEDICATIONS: Current Outpatient Medications on File Prior to Visit  Medication Sig Dispense Refill  . buPROPion (WELLBUTRIN XL) 300 MG 24 hr tablet Take 300 mg by mouth daily.    Marland Kitchen LORazepam (ATIVAN)  1 MG tablet Take 0.5 mg by mouth 2 (two) times daily as needed.     . metFORMIN (GLUCOPHAGE) 500 MG tablet Take 1 tablet (500 mg total) by mouth 2 (two) times daily with a meal. 60 tablet 0  . PARoxetine (PAXIL) 10 MG tablet Take 10 mg by mouth daily.     No current facility-administered medications on file prior to visit.     PAST MEDICAL HISTORY: Past Medical History:  Diagnosis Date  . Anxiety   . Arthritis    fingers  . Chest pain   . Constipation   . Depression   . Diverticulosis    mild on colonoscopy 2012  . Dyspnea on exertion   . Hay fever   . History of chicken pox   .  History of smoking    quit 11/2010  . HLD (hyperlipidemia)   . Lactose intolerance   . Leg edema   . Obesity   . Rheumatoid arthritis ([place])   . Swallowing difficulty     PAST SURGICAL HISTORY: Past Surgical History:  Procedure Laterality Date  . abd [place]  2006   nothing acute, no gallbladder stones  . bilateral tubal    . BUNIONECTOMY Right   . COLONOSCOPY  2012   WNL, mild diverticulosis, rpt 10 yrs  . LIPOMA EXCISION    . LIPOMA EXCISION Right 10/02/2017   Procedure: EXCISION OF SUBCUTANEOUS LIPOMA OF RIGHT UPPER LATERAL ARM;  Surgeon: Donnie Mesa, MD;  Location: [facility];  Service: General;  Laterality: Right;  . TUBAL LIGATION    . VAGINAL HYSTERECTOMY  1990    SOCIAL HISTORY: Social History   Tobacco Use  . Smoking status: Former Smoker    Packs/day: 1.00    Types: Cigarettes    Last attempt to quit: 11/10/2010    Years since quitting: 7.4  . Smokeless tobacco: Never Used  . Tobacco comment: Less than 1/2 PPD  Substance Use Topics  . Alcohol use: Yes    Alcohol/week: 1.0 standard drinks    Types: 1 Standard drinks or equivalent per week    Comment: social  . Drug use: No    FAMILY HISTORY: Family History  Problem Relation Age of Onset  . Diabetes Mother   . Hypertension Mother   . Emphysema Mother   . Coronary artery disease Mother 60  . Hyperlipidemia Mother   . Depression Mother   . Anxiety disorder Mother   . Alcohol abuse Mother   . Obesity Mother   . Coronary artery disease Father 33       MI death  . Alcohol abuse Father   . Sudden death Father   . Diabetes Brother   . Diabetes Maternal Aunt   . Colon cancer Maternal Aunt   . Colon cancer Maternal Uncle 30  . Diabetes Maternal Uncle   . Stroke Maternal Grandmother     ROS: Review of Systems  Constitutional: Positive for weight loss.  Respiratory: Negative for shortness of breath.   Cardiovascular: Negative for chest pain.  Endo/Heme/Allergies:       Negative for  polyphagia     PHYSICAL EXAM: Blood pressure 106/66, temperature 97.9 F (36.6 C), temperature source Oral, height 5\' 4"  (1.626 m), weight 186 lb (84.4 kg), SpO2 95 %. Body mass index is 31.93 kg/m. Physical Exam Vitals signs reviewed.  Constitutional:      Appearance: Normal appearance. She is obese.  Cardiovascular:     Rate and Rhythm: Normal rate.  Pulses: Normal pulses.  Pulmonary:     Effort: Pulmonary effort is normal.  Musculoskeletal: Normal range of motion.  Skin:    General: Skin is warm and dry.  Neurological:     Mental Status: She is alert and oriented to person, place, and time.  Psychiatric:        Mood and Affect: Mood normal.        Behavior: Behavior normal.     RECENT LABS AND TESTS: BMET    Component Value Date/Time   NA 137 03/17/2018 1031   K 4.8 03/17/2018 1031   CL 99 03/17/2018 1031   CO2 20 03/17/2018 1031   GLUCOSE 88 03/17/2018 1031   GLUCOSE 83 09/04/2010 0840   BUN 22 03/17/2018 1031   CREATININE 1.02 (H) 03/17/2018 1031   CALCIUM 9.5 03/17/2018 1031   GFRNONAA 58 (L) 03/17/2018 1031   GFRAA 67 03/17/2018 1031   Lab Results  Component Value Date   HGBA1C 5.9 (H) 03/17/2018   HGBA1C 6.2 (H) 12/09/2017   Lab Results  Component Value Date   INSULIN 13.4 03/17/2018   INSULIN 14.7 12/09/2017   CBC    Component Value Date/Time   WBC 5.8 12/09/2017 1407   WBC 8.8 09/04/2010 0840   RBC 4.91 12/09/2017 1407   RBC 4.45 09/04/2010 0840   HGB 12.8 12/09/2017 1407   HCT 39.7 12/09/2017 1407   PLT 271.0 09/04/2010 0840   MCV 81 12/09/2017 1407   MCH 26.1 (L) 12/09/2017 1407   MCHC 32.2 12/09/2017 1407   MCHC 33.9 09/04/2010 0840   RDW 16.7 (H) 12/09/2017 1407   LYMPHSABS 1.5 12/09/2017 1407   MONOABS 0.5 09/04/2010 0840   EOSABS 0.0 12/09/2017 1407   BASOSABS 0.0 12/09/2017 1407   Iron/TIBC/Ferritin/ %Sat No results found for: IRON, TIBC, FERRITIN, IRONPCTSAT Lipid Panel     Component Value Date/Time   CHOL 253 (H)  03/17/2018 1031   TRIG 137 03/17/2018 1031   HDL 64 03/17/2018 1031   CHOLHDL 6 09/04/2010 0840   VLDL 26.8 09/04/2010 0840   LDLCALC 162 (H) 03/17/2018 1031   LDLDIRECT 146.2 09/04/2010 0840   Hepatic Function Panel     Component Value Date/Time   PROT 7.3 03/17/2018 1031   ALBUMIN 4.8 03/17/2018 1031   AST 13 03/17/2018 1031   ALT 16 03/17/2018 1031   ALKPHOS 148 (H) 03/17/2018 1031   BILITOT <0.2 03/17/2018 1031      Component Value Date/Time   TSH 2.500 12/09/2017 1407   TSH 1.38 09/04/2010 0840      OBESITY BEHAVIORAL INTERVENTION VISIT  Today's visit was # 8   Starting weight: 194 lbs Starting date: 12/09/2017 Today's weight :: 186 lbs Today's date: 04/15/2018 Total lbs lost to date: 8 At least 15 minutes were spent on discussing the following behavioral intervention visit.   ASK: We discussed the diagnosis of obesity with Wanda Burns today and Delene agreed to give [place] permission to discuss obesity behavioral modification therapy today.  ASSESS: Shiara has the diagnosis of obesity and her BMI today is 31.91 Ravin is in the action stage of change   ADVISE: Wanda Burns was educated on the multiple health risks of obesity as well as the benefit of weight loss to improve her health. She was advised of the need for long term treatment and the importance of lifestyle modifications to improve her current health and to decrease her risk of future health problems.  AGREE: Multiple dietary modification options and treatment  options were discussed and  Wanda Burns agreed to follow the recommendations documented in the above note.  ARRANGE: Elvera was educated on the importance of frequent visits to treat obesity as outlined per CMS and USPSTF guidelines and agreed to schedule her next follow up appointment today.  I, Tammy Wysor, am acting as Location manager for [employer].  I have reviewed the above documentation for accuracy and completeness, and I agree with the  above.  - Berklee Battey, FNP-C.

## 2018-04-20 ENCOUNTER — Encounter (INDEPENDENT_AMBULATORY_CARE_PROVIDER_SITE_OTHER): Payer: Self-pay | Admitting: Family Medicine

## 2018-04-20 ENCOUNTER — Other Ambulatory Visit (INDEPENDENT_AMBULATORY_CARE_PROVIDER_SITE_OTHER): Payer: Self-pay | Admitting: Family Medicine

## 2018-04-20 DIAGNOSIS — R7303 Prediabetes: Secondary | ICD-10-CM

## 2018-04-29 ENCOUNTER — Encounter (INDEPENDENT_AMBULATORY_CARE_PROVIDER_SITE_OTHER): Payer: Self-pay | Admitting: Family Medicine

## 2018-04-29 ENCOUNTER — Ambulatory Visit (INDEPENDENT_AMBULATORY_CARE_PROVIDER_SITE_OTHER): Payer: PPO | Admitting: Family Medicine

## 2018-04-29 VITALS — BP 100/70 | HR 81 | Temp 98.0°F | Ht 64.0 in | Wt 188.0 lb

## 2018-04-29 DIAGNOSIS — E669 Obesity, unspecified: Secondary | ICD-10-CM | POA: Diagnosis not present

## 2018-04-29 DIAGNOSIS — Z6832 Body mass index (BMI) 32.0-32.9, adult: Secondary | ICD-10-CM

## 2018-04-29 DIAGNOSIS — R7303 Prediabetes: Secondary | ICD-10-CM | POA: Diagnosis not present

## 2018-04-29 NOTE — Progress Notes (Signed)
Office: [phone]  /  Fax: [phone]   HPI:   Chief Complaint: OBESITY Wanda Burns is here to discuss her progress with her obesity treatment plan. She is on the Category 1 plan and is following her eating plan approximately 80+% of the time. She states she is exercising 0 minutes 0 times per week. Wanda Burns feels she is retaining some fluid which has caused weight gain. Her appetite has been decreased and she is not eating all the food on the plan. She tries to make sure she eats 1000 calories a day. Her weight is 188 lb (85.3 kg) today and has had a weight gain of 2 lbs since her last visit. She has lost 6 lbs since starting treatment with [place].  Pre-Diabetes Wanda Burns has a diagnosis of prediabetes based on her elevated HgA1c and was informed this puts her at greater risk of developing diabetes. She is taking metformin currently and continues to work on diet and exercise to decrease risk of diabetes. She denies polyphagia or hypoglycemia.  ASSESSMENT AND PLAN:  Prediabetes  Class 1 obesity with serious comorbidity and body mass index (BMI) of 32.0 to 32.9 in adult, unspecified obesity type  PLAN:  Pre-Diabetes Wanda Burns will continue to work on weight loss, exercise, and decreasing simple carbohydrates in her diet to help decrease the risk of diabetes. We dicussed metformin including benefits and risks. She was informed that eating too many simple carbohydrates or too many calories at one sitting increases the likelihood of GI side effects. Wanda Burns will continue metformin for now and agrees to follow-up with [place] as directed to monitor her progress.  Obesity Wanda Burns is currently in the action stage of change. As such, her goal is to continue with weight loss efforts. She has agreed to follow the Category 1 plan with breakfast options. She may do a half sandwich, [place] yogurt and fruit for lunch. We discussed the following Behavioral Modification Strategies today: increasing lean protein intake and  planning for success. Wanda Burns was advised to join Wanda Burns and do water aerobics 2-3 times per week.  Wanda Burns has agreed to follow-up with our clinic in 2-3 weeks. She was informed of the importance of frequent follow up visits to maximize her success with intensive lifestyle modifications for her multiple health conditions.  ALLERGIES: Allergies  Allergen Reactions  . Codeine Itching  . Prednisone Other (See Comments)    Hyper, irritable    MEDICATIONS: Current Outpatient Medications on File Prior to Visit  Medication Sig Dispense Refill  . buPROPion (WELLBUTRIN XL) 300 MG 24 hr tablet Take 300 mg by mouth daily.    Marland Kitchen LORazepam (ATIVAN) 1 MG tablet Take 0.5 mg by mouth 2 (two) times daily as needed.     . metFORMIN (GLUCOPHAGE) 500 MG tablet Take 1 tablet (500 mg total) by mouth 2 (two) times daily with a meal. 60 tablet 0  . PARoxetine (PAXIL) 10 MG tablet Take 10 mg by mouth daily.     No current facility-administered medications on file prior to visit.     PAST MEDICAL HISTORY: Past Medical History:  Diagnosis Date  . Anxiety   . Arthritis    fingers  . Chest pain   . Constipation   . Depression   . Diverticulosis    mild on colonoscopy 2012  . Dyspnea on exertion   . Hay fever   . History of chicken pox   . History of smoking    quit 11/2010  . HLD (hyperlipidemia)   .  Lactose intolerance   . Leg edema   . Obesity   . Rheumatoid arthritis ([place])   . Swallowing difficulty     PAST SURGICAL HISTORY: Past Surgical History:  Procedure Laterality Date  . abd [place]  2006   nothing acute, no gallbladder stones  . bilateral tubal    . BUNIONECTOMY Right   . COLONOSCOPY  2012   WNL, mild diverticulosis, rpt 10 yrs  . LIPOMA EXCISION    . LIPOMA EXCISION Right 10/02/2017   Procedure: EXCISION OF SUBCUTANEOUS LIPOMA OF RIGHT UPPER LATERAL ARM;  Surgeon: Donnie Mesa, MD;  Location: [facility];  Service: General;  Laterality: Right;  . TUBAL LIGATION     . VAGINAL HYSTERECTOMY  1990    SOCIAL HISTORY: Social History   Tobacco Use  . Smoking status: Former Smoker    Packs/day: 1.00    Types: Cigarettes    Last attempt to quit: 11/10/2010    Years since quitting: 7.4  . Smokeless tobacco: Never Used  . Tobacco comment: Less than 1/2 PPD  Substance Use Topics  . Alcohol use: Yes    Alcohol/week: 1.0 standard drinks    Types: 1 Standard drinks or equivalent per week    Comment: social  . Drug use: No    FAMILY HISTORY: Family History  Problem Relation Age of Onset  . Diabetes Mother   . Hypertension Mother   . Emphysema Mother   . Coronary artery disease Mother 57  . Hyperlipidemia Mother   . Depression Mother   . Anxiety disorder Mother   . Alcohol abuse Mother   . Obesity Mother   . Coronary artery disease Father 47       MI death  . Alcohol abuse Father   . Sudden death Father   . Diabetes Brother   . Diabetes Maternal Aunt   . Colon cancer Maternal Aunt   . Colon cancer Maternal Uncle 24  . Diabetes Maternal Uncle   . Stroke Maternal Grandmother     ROS: Review of Systems  Constitutional: Negative for weight loss.  Endo/Heme/Allergies:       Negative for polyphagia. Negative for hypoglycemia   PHYSICAL EXAM: Blood pressure 100/70, pulse 81, temperature 98 F (36.7 C), height 5\' 4"  (1.626 m), weight 188 lb (85.3 kg), SpO2 96 %. Body mass index is 32.27 kg/m. Physical Exam Vitals signs reviewed.  Constitutional:      Appearance: Normal appearance. She is obese.  Cardiovascular:     Rate and Rhythm: Normal rate.     Pulses: Normal pulses.  Pulmonary:     Effort: Pulmonary effort is normal.     Breath sounds: Normal breath sounds.  Musculoskeletal: Normal range of motion.  Skin:    General: Skin is warm and dry.  Neurological:     Mental Status: She is alert and oriented to person, place, and time.  Psychiatric:        Behavior: Behavior normal.   RECENT LABS AND TESTS: BMET    Component  Value Date/Time   NA 137 03/17/2018 1031   K 4.8 03/17/2018 1031   CL 99 03/17/2018 1031   CO2 20 03/17/2018 1031   GLUCOSE 88 03/17/2018 1031   GLUCOSE 83 09/04/2010 0840   BUN 22 03/17/2018 1031   CREATININE 1.02 (H) 03/17/2018 1031   CALCIUM 9.5 03/17/2018 1031   GFRNONAA 58 (L) 03/17/2018 1031   GFRAA 67 03/17/2018 1031   Lab Results  Component Value Date  HGBA1C 5.9 (H) 03/17/2018   HGBA1C 6.2 (H) 12/09/2017   Lab Results  Component Value Date   INSULIN 13.4 03/17/2018   INSULIN 14.7 12/09/2017   CBC    Component Value Date/Time   WBC 5.8 12/09/2017 1407   WBC 8.8 09/04/2010 0840   RBC 4.91 12/09/2017 1407   RBC 4.45 09/04/2010 0840   HGB 12.8 12/09/2017 1407   HCT 39.7 12/09/2017 1407   PLT 271.0 09/04/2010 0840   MCV 81 12/09/2017 1407   MCH 26.1 (L) 12/09/2017 1407   MCHC 32.2 12/09/2017 1407   MCHC 33.9 09/04/2010 0840   RDW 16.7 (H) 12/09/2017 1407   LYMPHSABS 1.5 12/09/2017 1407   MONOABS 0.5 09/04/2010 0840   EOSABS 0.0 12/09/2017 1407   BASOSABS 0.0 12/09/2017 1407   Iron/TIBC/Ferritin/ %Sat No results found for: IRON, TIBC, FERRITIN, IRONPCTSAT Lipid Panel     Component Value Date/Time   CHOL 253 (H) 03/17/2018 1031   TRIG 137 03/17/2018 1031   HDL 64 03/17/2018 1031   CHOLHDL 6 09/04/2010 0840   VLDL 26.8 09/04/2010 0840   LDLCALC 162 (H) 03/17/2018 1031   LDLDIRECT 146.2 09/04/2010 0840   Hepatic Function Panel     Component Value Date/Time   PROT 7.3 03/17/2018 1031   ALBUMIN 4.8 03/17/2018 1031   AST 13 03/17/2018 1031   ALT 16 03/17/2018 1031   ALKPHOS 148 (H) 03/17/2018 1031   BILITOT <0.2 03/17/2018 1031      Component Value Date/Time   TSH 2.500 12/09/2017 1407   TSH 1.38 09/04/2010 0840    Ref. Range 03/17/2018 10:31  Vitamin D, 25-Hydroxy Latest Ref Range: 30.0 - 100.0 ng/mL 52.2   OBESITY BEHAVIORAL INTERVENTION VISIT  Today's visit was #10  Starting weight: 194 lbs Starting date: 12/09/2017 Today's weight: 188  lbs  Today's date: 04/29/2018 Total lbs lost to date: 6 At least 15 minutes were spent on discussing the following behavioral intervention visit.  ASK: We discussed the diagnosis of obesity with Wanda Burns today and Wanda Burns agreed to give [place] permission to discuss obesity behavioral modification therapy today.  ASSESS: Kimberla has the diagnosis of obesity and her BMI today is 32.27. Wanda Burns is in the action stage of change.   ADVISE: Wanda Burns was educated on the multiple health risks of obesity as well as the benefit of weight loss to improve her health. She was advised of the need for long term treatment and the importance of lifestyle modifications to improve her current health and to decrease her risk of future health problems.  AGREE: Multiple dietary modification options and treatment options were discussed and  Wanda Burns agreed to follow the recommendations documented in the above note.  ARRANGE: Wanda Burns was educated on the importance of frequent visits to treat obesity as outlined per CMS and USPSTF guidelines and agreed to schedule her next follow up appointment today.  IMichaelene Burns, am acting as Location manager for [employer], FNP-C.  I have reviewed the above documentation for accuracy and completeness, and I agree with the above.  - Wanda Graciano, FNP-C.

## 2018-04-30 ENCOUNTER — Encounter (INDEPENDENT_AMBULATORY_CARE_PROVIDER_SITE_OTHER): Payer: Self-pay | Admitting: Family Medicine

## 2018-05-20 ENCOUNTER — Other Ambulatory Visit: Payer: Self-pay

## 2018-05-20 ENCOUNTER — Encounter (INDEPENDENT_AMBULATORY_CARE_PROVIDER_SITE_OTHER): Payer: Self-pay | Admitting: Family Medicine

## 2018-05-20 ENCOUNTER — Ambulatory Visit (INDEPENDENT_AMBULATORY_CARE_PROVIDER_SITE_OTHER): Payer: PPO | Admitting: Family Medicine

## 2018-05-20 VITALS — BP 112/76 | HR 85 | Temp 98.2°F | Ht 64.0 in | Wt 186.0 lb

## 2018-05-20 DIAGNOSIS — E669 Obesity, unspecified: Secondary | ICD-10-CM

## 2018-05-20 DIAGNOSIS — R7303 Prediabetes: Secondary | ICD-10-CM

## 2018-05-20 DIAGNOSIS — Z6831 Body mass index (BMI) 31.0-31.9, adult: Secondary | ICD-10-CM

## 2018-05-20 NOTE — Progress Notes (Signed)
Office: [phone]  /  Fax: [phone]   HPI:   Chief Complaint: OBESITY Wanda Burns is here to discuss her progress with her obesity treatment plan. She is on the Category 1 plan with breakfast options and is following her eating plan approximately 80-85 % of the time. She states she is exercising 0 minutes 0 times per week. Imya is doing well on the plan. She is meeting protein goals. She has been increasing water intake.  Her weight is 186 lb (84.4 kg) today and has had a weight loss of 2 pounds over a period of 3 weeks since her last visit. She has lost 8 lbs since starting treatment with [place].  Pre-Diabetes Wanda Burns has a diagnosis of prediabetes based on her elevated HgA1c and was informed this puts her at greater risk of developing diabetes. She is taking metformin currently and continues to work on diet and exercise to decrease risk of diabetes. She denies hypoglycemia.  She feels the metformin is causing nausea. She would like to stop Metformin for now. She has been sticking to the plan. She denies polyphagia.  Lab Results  Component Value Date   HGBA1C 5.9 (H) 03/17/2018    ASSESSMENT AND PLAN:  Prediabetes  Class 1 obesity with serious comorbidity and body mass index (BMI) of 31.0 to 31.9 in adult, unspecified obesity type  PLAN: Pre-Diabetes Wanda Burns will continue to work on weight loss, exercise, and decreasing simple carbohydrates in her diet to help decrease the risk of diabetes.   We will discontinue the Metformin because of nausea. Wanda Burns agrees to follow up with our clinic in 3 weeks.  Obesity Wanda Burns is currently in the action stage of change. As such, her goal is to continue with weight loss efforts She has agreed to follow the Category 1 plan with breakfast options  Wanda Burns agrees to go to the [facility] 3 x per week for water aerobics. We discussed the following Behavioral Modification Strategies today: increase H20 intake and planning for success  Wanda Burns has agreed to follow  up with our clinic in 3 weeks. She was informed of the importance of frequent follow up visits to maximize her success with intensive lifestyle modifications for her multiple health conditions.  ALLERGIES: Allergies  Allergen Reactions  . Codeine Itching  . Prednisone Other (See Comments)    Hyper, irritable    MEDICATIONS: Current Outpatient Medications on File Prior to Visit  Medication Sig Dispense Refill  . buPROPion (WELLBUTRIN XL) 300 MG 24 hr tablet Take 300 mg by mouth daily.    . cholecalciferol (VITAMIN D3) 25 MCG (1000 UT) tablet Take 2,000 Units by mouth daily.    Marland Kitchen LORazepam (ATIVAN) 1 MG tablet Take 0.5 mg by mouth 2 (two) times daily as needed.     Marland Kitchen PARoxetine (PAXIL) 10 MG tablet Take 10 mg by mouth daily.     No current facility-administered medications on file prior to visit.     PAST MEDICAL HISTORY: Past Medical History:  Diagnosis Date  . Anxiety   . Arthritis    fingers  . Chest pain   . Constipation   . Depression   . Diverticulosis    mild on colonoscopy 2012  . Dyspnea on exertion   . Hay fever   . History of chicken pox   . History of smoking    quit 11/2010  . HLD (hyperlipidemia)   . Lactose intolerance   . Leg edema   . Obesity   . Rheumatoid arthritis ([place])   .  Swallowing difficulty     PAST SURGICAL HISTORY: Past Surgical History:  Procedure Laterality Date  . abd [place]  2006   nothing acute, no gallbladder stones  . bilateral tubal    . BUNIONECTOMY Right   . COLONOSCOPY  2012   WNL, mild diverticulosis, rpt 10 yrs  . LIPOMA EXCISION    . LIPOMA EXCISION Right 10/02/2017   Procedure: EXCISION OF SUBCUTANEOUS LIPOMA OF RIGHT UPPER LATERAL ARM;  Surgeon: Donnie Mesa, MD;  Location: [facility];  Service: General;  Laterality: Right;  . TUBAL LIGATION    . VAGINAL HYSTERECTOMY  1990    SOCIAL HISTORY: Social History   Tobacco Use  . Smoking status: Former Smoker    Packs/day: 1.00    Types: Cigarettes     Last attempt to quit: 11/10/2010    Years since quitting: 7.5  . Smokeless tobacco: Never Used  . Tobacco comment: Less than 1/2 PPD  Substance Use Topics  . Alcohol use: Yes    Alcohol/week: 1.0 standard drinks    Types: 1 Standard drinks or equivalent per week    Comment: social  . Drug use: No    FAMILY HISTORY: Family History  Problem Relation Age of Onset  . Diabetes Mother   . Hypertension Mother   . Emphysema Mother   . Coronary artery disease Mother 80  . Hyperlipidemia Mother   . Depression Mother   . Anxiety disorder Mother   . Alcohol abuse Mother   . Obesity Mother   . Coronary artery disease Father 5       MI death  . Alcohol abuse Father   . Sudden death Father   . Diabetes Brother   . Diabetes Maternal Aunt   . Colon cancer Maternal Aunt   . Colon cancer Maternal Uncle 78  . Diabetes Maternal Uncle   . Stroke Maternal Grandmother     ROS: Review of Systems  Constitutional: Positive for weight loss.  Genitourinary:       Negative for polyuria  Endo/Heme/Allergies:       Negative for polyphagia    PHYSICAL EXAM: Blood pressure 112/76, pulse 85, temperature 98.2 F (36.8 C), height 5\' 4"  (1.626 m), weight 186 lb (84.4 kg), SpO2 94 %. Body mass index is 31.93 kg/m. Physical Exam Vitals signs reviewed.  Constitutional:      Appearance: Normal appearance. She is obese.  Cardiovascular:     Rate and Rhythm: Normal rate.     Pulses: Normal pulses.  Pulmonary:     Effort: Pulmonary effort is normal.  Musculoskeletal: Normal range of motion.  Skin:    General: Skin is warm and dry.  Neurological:     Mental Status: She is alert and oriented to person, place, and time.  Psychiatric:        Mood and Affect: Mood normal.        Behavior: Behavior normal.     RECENT LABS AND TESTS: BMET    Component Value Date/Time   NA 137 03/17/2018 1031   K 4.8 03/17/2018 1031   CL 99 03/17/2018 1031   CO2 20 03/17/2018 1031   GLUCOSE 88 03/17/2018 1031    GLUCOSE 83 09/04/2010 0840   BUN 22 03/17/2018 1031   CREATININE 1.02 (H) 03/17/2018 1031   CALCIUM 9.5 03/17/2018 1031   GFRNONAA 58 (L) 03/17/2018 1031   GFRAA 67 03/17/2018 1031   Lab Results  Component Value Date   HGBA1C 5.9 (H) 03/17/2018  HGBA1C 6.2 (H) 12/09/2017   Lab Results  Component Value Date   INSULIN 13.4 03/17/2018   INSULIN 14.7 12/09/2017   CBC    Component Value Date/Time   WBC 5.8 12/09/2017 1407   WBC 8.8 09/04/2010 0840   RBC 4.91 12/09/2017 1407   RBC 4.45 09/04/2010 0840   HGB 12.8 12/09/2017 1407   HCT 39.7 12/09/2017 1407   PLT 271.0 09/04/2010 0840   MCV 81 12/09/2017 1407   MCH 26.1 (L) 12/09/2017 1407   MCHC 32.2 12/09/2017 1407   MCHC 33.9 09/04/2010 0840   RDW 16.7 (H) 12/09/2017 1407   LYMPHSABS 1.5 12/09/2017 1407   MONOABS 0.5 09/04/2010 0840   EOSABS 0.0 12/09/2017 1407   BASOSABS 0.0 12/09/2017 1407   Iron/TIBC/Ferritin/ %Sat No results found for: IRON, TIBC, FERRITIN, IRONPCTSAT Lipid Panel     Component Value Date/Time   CHOL 253 (H) 03/17/2018 1031   TRIG 137 03/17/2018 1031   HDL 64 03/17/2018 1031   CHOLHDL 6 09/04/2010 0840   VLDL 26.8 09/04/2010 0840   LDLCALC 162 (H) 03/17/2018 1031   LDLDIRECT 146.2 09/04/2010 0840   Hepatic Function Panel     Component Value Date/Time   PROT 7.3 03/17/2018 1031   ALBUMIN 4.8 03/17/2018 1031   AST 13 03/17/2018 1031   ALT 16 03/17/2018 1031   ALKPHOS 148 (H) 03/17/2018 1031   BILITOT <0.2 03/17/2018 1031      Component Value Date/Time   TSH 2.500 12/09/2017 1407   TSH 1.38 09/04/2010 0840      OBESITY BEHAVIORAL INTERVENTION VISIT  Today's visit was # 11   Starting weight: 194 lbs Starting date: 12/09/2017 Today's weight : Weight: 186 lb (84.4 kg)  Today's date: 05/20/2018 Total lbs lost to date: 8 At least 15 minutes were spent on discussing the following behavioral intervention visit.     05/20/2018  BP 112/76  Temp 98.2 F (36.8 C)  Pulse 85  SpO2 94  %  Weight 186 lb  Height 5\' 4"  (1.626 m)  BMI (Calculated) 31.91    ASK: We discussed the diagnosis of obesity with Wanda Burns today and Tashera agreed to give [place] permission to discuss obesity behavioral modification therapy today.  ASSESS: Estee has the diagnosis of obesity and her BMI today is 31.91 Wanda Burns is in the action stage of change   ADVISE: Wanda Burns was educated on the multiple health risks of obesity as well as the benefit of weight loss to improve her health. She was advised of the need for long term treatment and the importance of lifestyle modifications to improve her current health and to decrease her risk of future health problems.  AGREE: Multiple dietary modification options and treatment options were discussed and  Wanda Burns agreed to follow the recommendations documented in the above note.  ARRANGE: Wanda Burns was educated on the importance of frequent visits to treat obesity as outlined per CMS and USPSTF guidelines and agreed to schedule her next follow up appointment today.  I, Tammy Wysor, am acting as Location manager for [employer], FNP-C.  I have reviewed the above documentation for accuracy and completeness, and I agree with the above.  - Markeshia Giebel, FNP-C.

## 2018-06-03 ENCOUNTER — Encounter (INDEPENDENT_AMBULATORY_CARE_PROVIDER_SITE_OTHER): Payer: Self-pay

## 2018-06-10 ENCOUNTER — Encounter (INDEPENDENT_AMBULATORY_CARE_PROVIDER_SITE_OTHER): Payer: Self-pay | Admitting: Family Medicine

## 2018-06-10 ENCOUNTER — Ambulatory Visit (INDEPENDENT_AMBULATORY_CARE_PROVIDER_SITE_OTHER): Payer: PPO | Admitting: Family Medicine

## 2018-06-10 ENCOUNTER — Other Ambulatory Visit: Payer: Self-pay

## 2018-06-10 DIAGNOSIS — E88819 Insulin resistance, unspecified: Secondary | ICD-10-CM | POA: Insufficient documentation

## 2018-06-10 DIAGNOSIS — Z6831 Body mass index (BMI) 31.0-31.9, adult: Secondary | ICD-10-CM | POA: Diagnosis not present

## 2018-06-10 DIAGNOSIS — E669 Obesity, unspecified: Secondary | ICD-10-CM

## 2018-06-10 DIAGNOSIS — E8881 Metabolic syndrome: Secondary | ICD-10-CM | POA: Diagnosis not present

## 2018-06-10 NOTE — Progress Notes (Signed)
Office: [phone]  /  Fax: [phone] TeleHealth Visit:  Wanda Burns has consented to this TeleHealth visit today via [place]. The patient is located at home, the provider is located at the [facility] office. The participants in this visit include the listed provider and patient.  HPI:   Chief Complaint: OBESITY Wanda Burns is here to discuss her progress with her obesity treatment plan. She is on the Category 1 plan with breakfast options and is following her eating plan approximately 70% of the time. She states she is walking 30 minutes 3 times per week. Wanda Burns states she is finding groceries to stay on her plan. She states she weighed 187 lbs four days ago on her Wii at home. She does report she is not eating all of the vegetables on the plan and is not getting all of her protein in. We were unable to weigh the patient today for this TeleHealth visit.She feels as if she has gained weight since her last visit. She has lost 8 lbs since starting treatment with [place].  Insulin Resistance Wanda Burns has a diagnosis of insulin resistance based on her elevated fasting insulin level >5. Although Wanda Burns's blood glucose readings are still under good control, insulin resistance puts her at greater risk of metabolic syndrome and diabetes. She is not taking metformin currently and continues to work on diet and exercise to decrease risk of diabetes. She reports no cravings and no polyphagia.  ASSESSMENT AND PLAN:  Insulin resistance  Class 1 obesity with serious comorbidity and body mass index (BMI) of 31.0 to 31.9 in adult, unspecified obesity type  PLAN:  Insulin Resistance Wanda Burns will continue to work on weight loss, exercise, and decreasing simple carbohydrates in her diet to help decrease the risk of diabetes. We dicussed metformin including benefits and risks. She was informed that eating too many simple carbohydrates or too many calories at one sitting increases the likelihood of GI  side effects. Wanda Burns is not taking metformin currently. Wanda Burns agreed to follow-up with [place] as directed to monitor her progress.  I spent > than 50% of the 15 minute visit on counseling as documented in the note.  Obesity Wanda Burns is currently in the action stage of change. As such, her goal is to continue with weight loss efforts. She has agreed to follow the Category 1 plan with breakfast options. Wanda Burns has been instructed to walk 30 minutes 3 times per week. We discussed the following Behavioral Modification Strategies today: increasing lean protein intake and planning for success.  Wanda Burns has agreed to follow-up with our clinic in 2-3 weeks by phone call only. She was informed of the importance of frequent follow-up visits to maximize her success with intensive lifestyle modifications for her multiple health conditions.  ALLERGIES: Allergies  Allergen Reactions   Codeine Itching   Prednisone Other (See Comments)    Hyper, irritable    MEDICATIONS: Current Outpatient Medications on File Prior to Visit  Medication Sig Dispense Refill   buPROPion (WELLBUTRIN XL) 300 MG 24 hr tablet Take 300 mg by mouth daily.     cholecalciferol (VITAMIN D3) 25 MCG (1000 UT) tablet Take 2,000 Units by mouth daily.     LORazepam (ATIVAN) 1 MG tablet Take 0.5 mg by mouth 2 (two) times daily as needed.      PARoxetine (PAXIL) 10 MG tablet Take 10 mg by mouth daily.     No current facility-administered medications on file prior to visit.     PAST MEDICAL  HISTORY: Past Medical History:  Diagnosis Date   Anxiety    Arthritis    fingers   Chest pain    Constipation    Depression    Diverticulosis    mild on colonoscopy 2012   Dyspnea on exertion    Hay fever    History of chicken pox    History of smoking    quit 11/2010   HLD (hyperlipidemia)    Lactose intolerance    Leg edema    Obesity    Rheumatoid arthritis ([place])    Swallowing difficulty     PAST SURGICAL  HISTORY: Past Surgical History:  Procedure Laterality Date   abd [place]  2006   nothing acute, no gallbladder stones   bilateral tubal     BUNIONECTOMY Right    COLONOSCOPY  2012   WNL, mild diverticulosis, rpt 10 yrs   LIPOMA EXCISION     LIPOMA EXCISION Right 10/02/2017   Procedure: EXCISION OF SUBCUTANEOUS LIPOMA OF RIGHT UPPER LATERAL ARM;  Surgeon: Donnie Mesa, MD;  Location: [facility];  Service: General;  Laterality: Right;   TUBAL LIGATION     VAGINAL HYSTERECTOMY  1990    SOCIAL HISTORY: Social History   Tobacco Use   Smoking status: Former Smoker    Packs/day: 1.00    Types: Cigarettes    Last attempt to quit: 11/10/2010    Years since quitting: 7.5   Smokeless tobacco: Never Used   Tobacco comment: Less than 1/2 PPD  Substance Use Topics   Alcohol use: Yes    Alcohol/week: 1.0 standard drinks    Types: 1 Standard drinks or equivalent per week    Comment: social   Drug use: No    FAMILY HISTORY: Family History  Problem Relation Age of Onset   Diabetes Mother    Hypertension Mother    Emphysema Mother    Coronary artery disease Mother 25   Hyperlipidemia Mother    Depression Mother    Anxiety disorder Mother    Alcohol abuse Mother    Obesity Mother    Coronary artery disease Father 30       MI death   Alcohol abuse Father    Sudden death Father    Diabetes Brother    Diabetes Maternal Aunt    Colon cancer Maternal Aunt    Colon cancer Maternal Uncle 70   Diabetes Maternal Uncle    Stroke Maternal Grandmother    ROS: Review of Systems  Endo/Heme/Allergies:       Negative for polyphagia.   PHYSICAL EXAM: Pt in no acute distress  RECENT LABS AND TESTS: BMET    Component Value Date/Time   NA 137 03/17/2018 1031   K 4.8 03/17/2018 1031   CL 99 03/17/2018 1031   CO2 20 03/17/2018 1031   GLUCOSE 88 03/17/2018 1031   GLUCOSE 83 09/04/2010 0840   BUN 22 03/17/2018 1031   CREATININE 1.02 (H)  03/17/2018 1031   CALCIUM 9.5 03/17/2018 1031   GFRNONAA 58 (L) 03/17/2018 1031   GFRAA 67 03/17/2018 1031   Lab Results  Component Value Date   HGBA1C 5.9 (H) 03/17/2018   HGBA1C 6.2 (H) 12/09/2017   Lab Results  Component Value Date   INSULIN 13.4 03/17/2018   INSULIN 14.7 12/09/2017   CBC    Component Value Date/Time   WBC 5.8 12/09/2017 1407   WBC 8.8 09/04/2010 0840   RBC 4.91 12/09/2017 1407   RBC 4.45 09/04/2010 0840  HGB 12.8 12/09/2017 1407   HCT 39.7 12/09/2017 1407   PLT 271.0 09/04/2010 0840   MCV 81 12/09/2017 1407   MCH 26.1 (L) 12/09/2017 1407   MCHC 32.2 12/09/2017 1407   MCHC 33.9 09/04/2010 0840   RDW 16.7 (H) 12/09/2017 1407   LYMPHSABS 1.5 12/09/2017 1407   MONOABS 0.5 09/04/2010 0840   EOSABS 0.0 12/09/2017 1407   BASOSABS 0.0 12/09/2017 1407   Iron/TIBC/Ferritin/ %Sat No results found for: IRON, TIBC, FERRITIN, IRONPCTSAT Lipid Panel     Component Value Date/Time   CHOL 253 (H) 03/17/2018 1031   TRIG 137 03/17/2018 1031   HDL 64 03/17/2018 1031   CHOLHDL 6 09/04/2010 0840   VLDL 26.8 09/04/2010 0840   LDLCALC 162 (H) 03/17/2018 1031   LDLDIRECT 146.2 09/04/2010 0840   Hepatic Function Panel     Component Value Date/Time   PROT 7.3 03/17/2018 1031   ALBUMIN 4.8 03/17/2018 1031   AST 13 03/17/2018 1031   ALT 16 03/17/2018 1031   ALKPHOS 148 (H) 03/17/2018 1031   BILITOT <0.2 03/17/2018 1031      Component Value Date/Time   TSH 2.500 12/09/2017 1407   TSH 1.38 09/04/2010 0840   Results for Wanda, Burns (MRN [id number]) as of 06/10/2018 12:05  Ref. Range 03/17/2018 10:31  Vitamin D, 25-Hydroxy Latest Ref Range: 30.0 - 100.0 ng/mL 52.2    I, Michaelene Song, am acting as Location manager for [employer], FNP-C.  I have reviewed the above documentation for accuracy and completeness, and I agree with the above.  - Britney Newstrom, FNP-C.

## 2018-06-22 ENCOUNTER — Encounter (INDEPENDENT_AMBULATORY_CARE_PROVIDER_SITE_OTHER): Payer: Self-pay | Admitting: Family Medicine

## 2018-06-22 NOTE — Telephone Encounter (Signed)
Please review

## 2018-07-01 ENCOUNTER — Ambulatory Visit (INDEPENDENT_AMBULATORY_CARE_PROVIDER_SITE_OTHER): Payer: Self-pay | Admitting: Family Medicine

## 2018-09-10 ENCOUNTER — Other Ambulatory Visit: Payer: Self-pay | Admitting: Family Medicine

## 2018-09-10 ENCOUNTER — Ambulatory Visit
Admission: RE | Admit: 2018-09-10 | Discharge: 2018-09-10 | Disposition: A | Discharge status: Home / Self Care | Payer: PPO | Source: Ambulatory Visit | Attending: Family Medicine | Admitting: Family Medicine

## 2018-09-10 DIAGNOSIS — R0789 Other chest pain: Secondary | ICD-10-CM

## 2018-09-10 DIAGNOSIS — M940 Chondrocostal junction syndrome [Tietze]: Secondary | ICD-10-CM

## 2018-09-10 DIAGNOSIS — E7800 Pure hypercholesterolemia, unspecified: Secondary | ICD-10-CM | POA: Diagnosis not present

## 2018-09-10 DIAGNOSIS — Z0000 Encounter for general adult medical examination without abnormal findings: Secondary | ICD-10-CM | POA: Diagnosis not present

## 2018-09-10 DIAGNOSIS — F419 Anxiety disorder, unspecified: Secondary | ICD-10-CM | POA: Diagnosis not present

## 2018-09-10 DIAGNOSIS — F329 Major depressive disorder, single episode, unspecified: Secondary | ICD-10-CM | POA: Diagnosis not present

## 2018-09-10 DIAGNOSIS — M549 Dorsalgia, unspecified: Secondary | ICD-10-CM | POA: Diagnosis not present

## 2018-09-10 DIAGNOSIS — R7303 Prediabetes: Secondary | ICD-10-CM | POA: Diagnosis not present

## 2018-09-14 DIAGNOSIS — F329 Major depressive disorder, single episode, unspecified: Secondary | ICD-10-CM | POA: Diagnosis not present

## 2018-09-14 DIAGNOSIS — E7800 Pure hypercholesterolemia, unspecified: Secondary | ICD-10-CM | POA: Diagnosis not present

## 2018-09-14 DIAGNOSIS — R0789 Other chest pain: Secondary | ICD-10-CM | POA: Diagnosis not present

## 2018-09-14 DIAGNOSIS — F419 Anxiety disorder, unspecified: Secondary | ICD-10-CM | POA: Diagnosis not present

## 2018-09-14 DIAGNOSIS — R7301 Impaired fasting glucose: Secondary | ICD-10-CM | POA: Diagnosis not present

## 2018-11-12 DIAGNOSIS — H2513 Age-related nuclear cataract, bilateral: Secondary | ICD-10-CM | POA: Diagnosis not present

## 2018-11-12 DIAGNOSIS — H40023 Open angle with borderline findings, high risk, bilateral: Secondary | ICD-10-CM | POA: Diagnosis not present

## 2018-12-10 DIAGNOSIS — Z23 Encounter for immunization: Secondary | ICD-10-CM | POA: Diagnosis not present

## 2018-12-15 DIAGNOSIS — H25013 Cortical age-related cataract, bilateral: Secondary | ICD-10-CM | POA: Diagnosis not present

## 2018-12-15 DIAGNOSIS — H2511 Age-related nuclear cataract, right eye: Secondary | ICD-10-CM | POA: Diagnosis not present

## 2018-12-15 DIAGNOSIS — H18413 Arcus senilis, bilateral: Secondary | ICD-10-CM | POA: Diagnosis not present

## 2018-12-15 DIAGNOSIS — H25043 Posterior subcapsular polar age-related cataract, bilateral: Secondary | ICD-10-CM | POA: Diagnosis not present

## 2018-12-15 DIAGNOSIS — H2513 Age-related nuclear cataract, bilateral: Secondary | ICD-10-CM | POA: Diagnosis not present

## 2018-12-16 DIAGNOSIS — R300 Dysuria: Secondary | ICD-10-CM | POA: Diagnosis not present

## 2018-12-16 DIAGNOSIS — R399 Unspecified symptoms and signs involving the genitourinary system: Secondary | ICD-10-CM | POA: Diagnosis not present

## 2019-01-11 IMAGING — US US EXTREM UP *R* LTD
1 series · 14 of 25 positions shown · non-contrast
Comparison: None

CLINICAL DATA: Right arm numbness and mass.

EXAM:
ULTRASOUND RIGHT UPPER EXTREMITY LIMITED
TECHNIQUE: Ultrasound examination of the upper extremity soft tissues was
performed in the area of clinical concern.

[Series 1: us extrem up *right* ltd · 31 acquisitions, 14 frames shown]
[im 1/31]
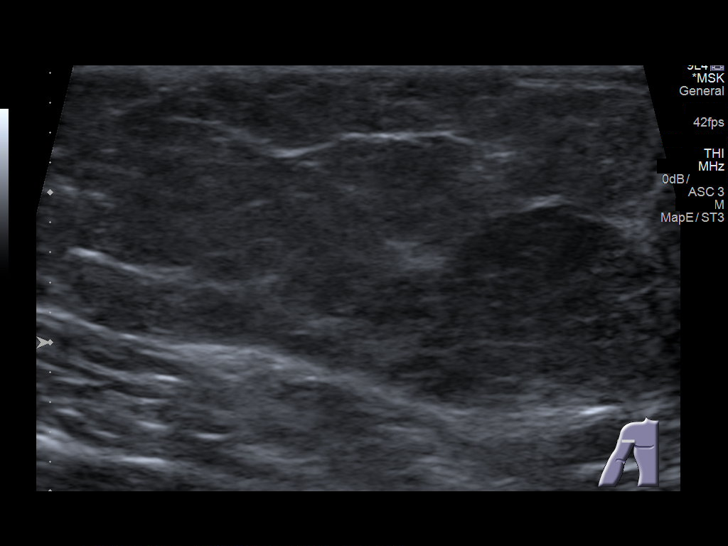
[im 3/31]
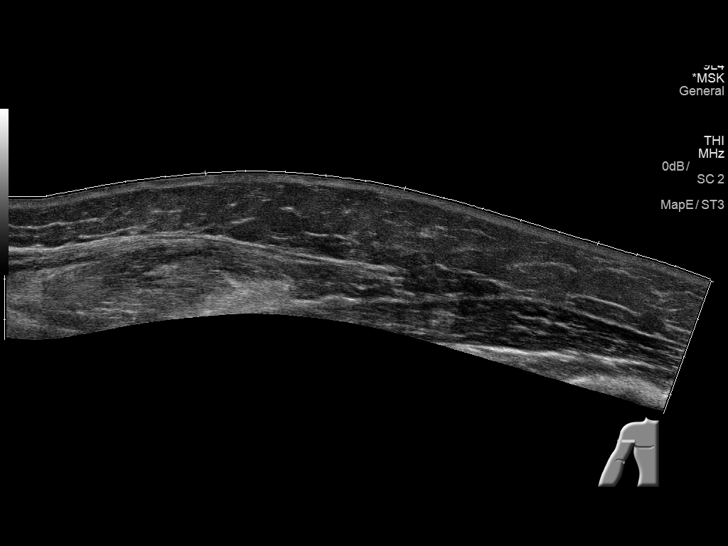
[im 6/31]
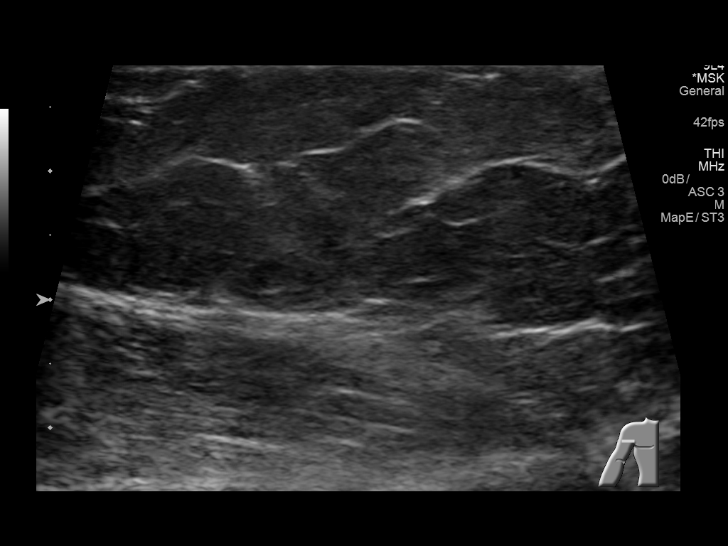
[im 8/31]
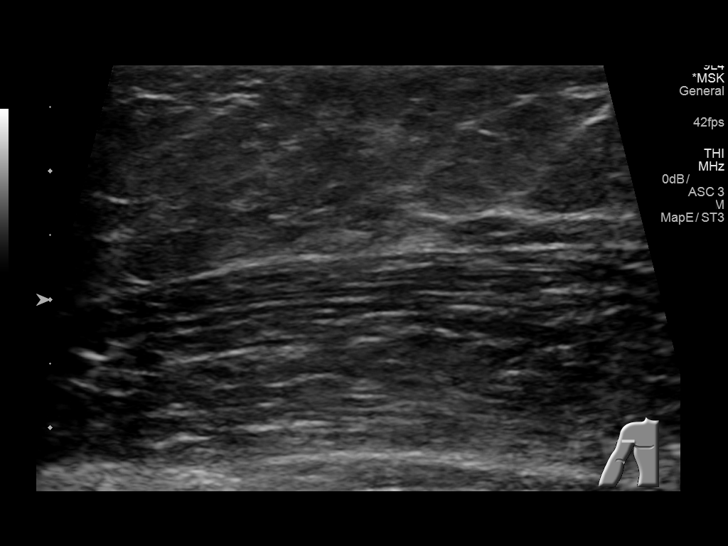
[im 11/31]
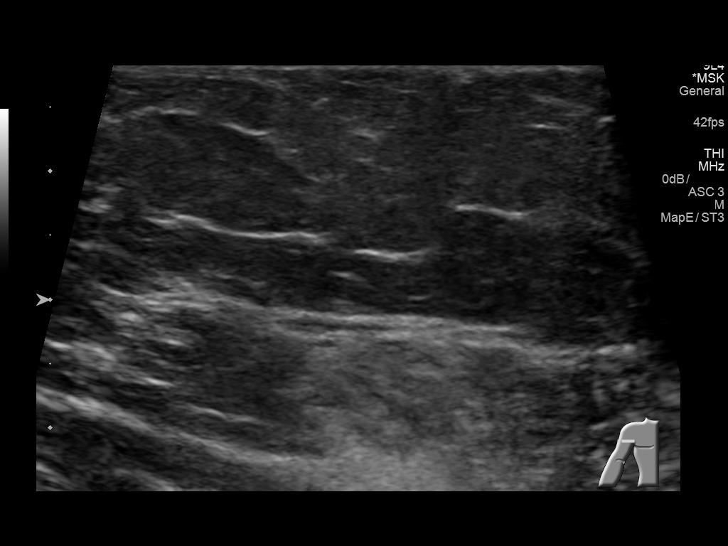
[im 12/31]
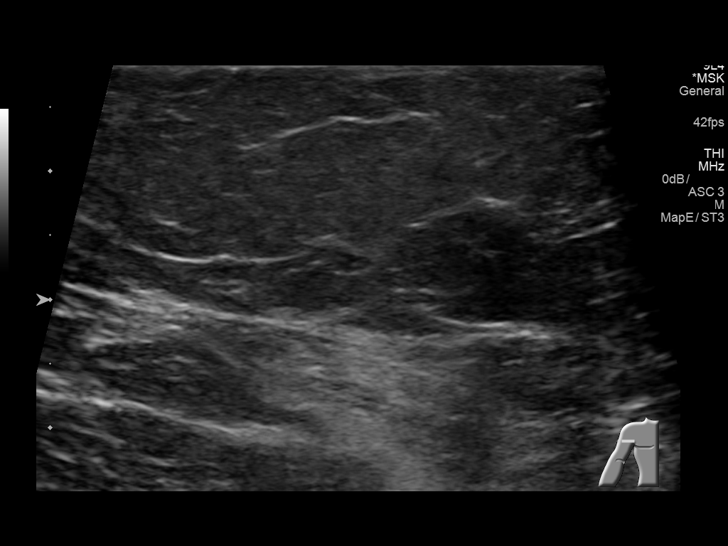
[im 14/31]
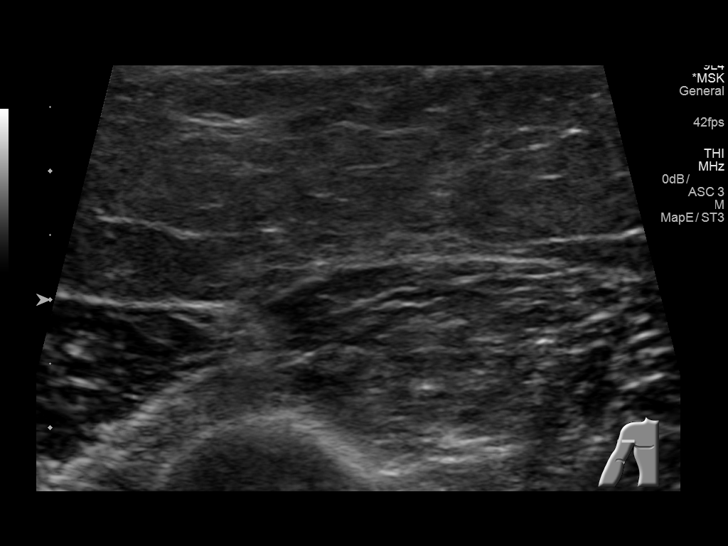
[im 17/31]
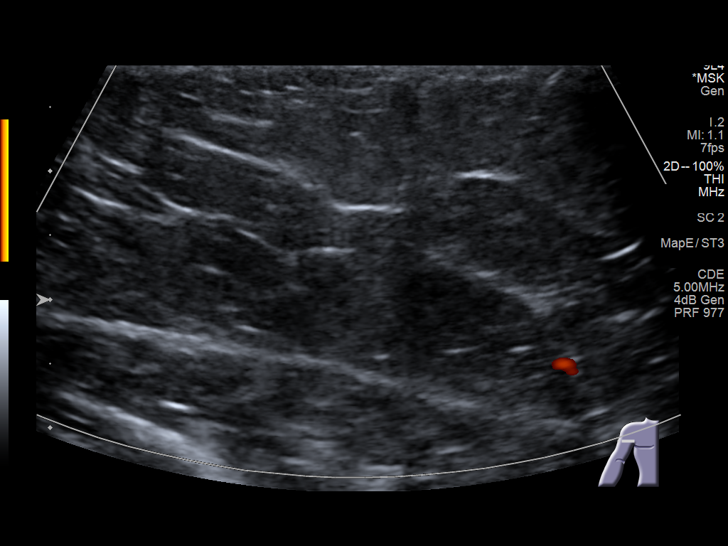
[im 19/31]
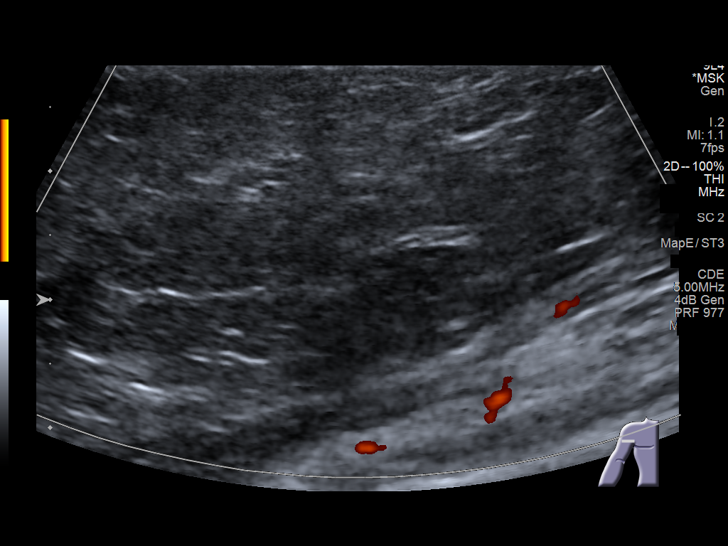
[im 21/31]
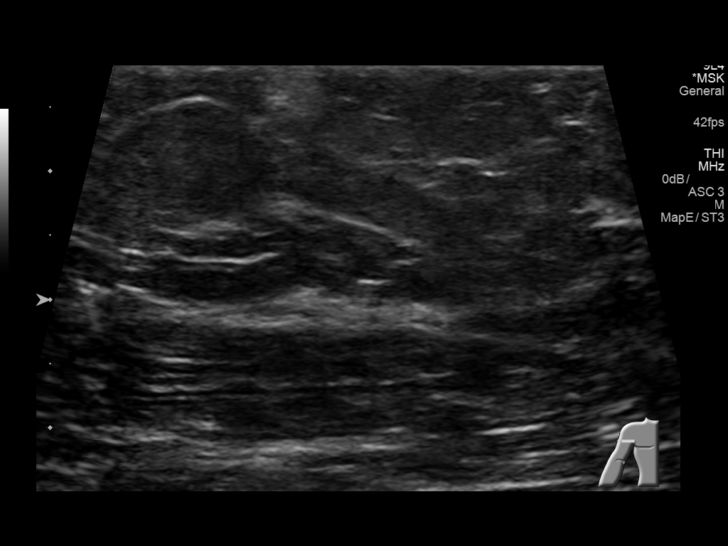
[im 23/31]
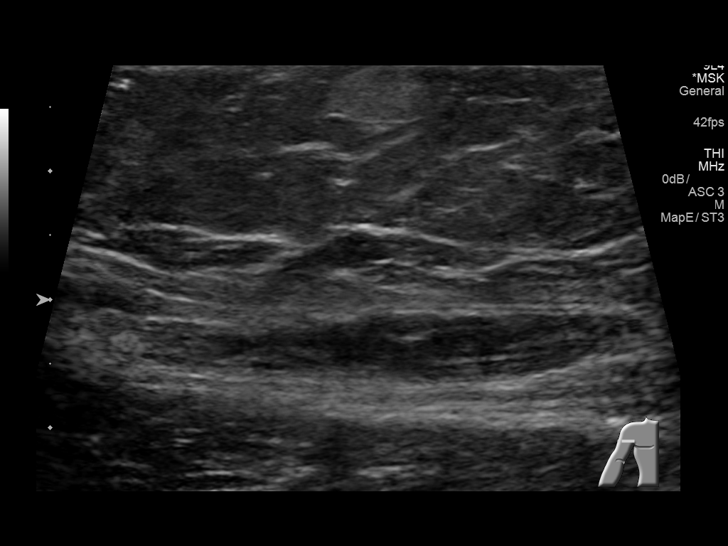
[im 26/31]
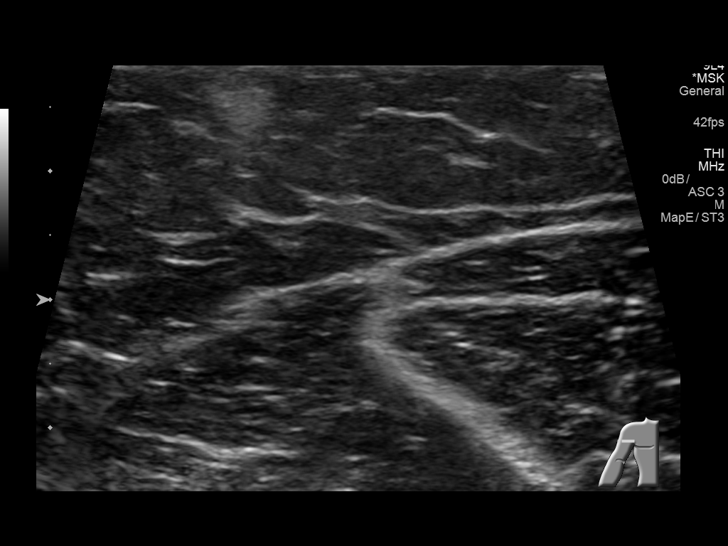
[im 28/31]
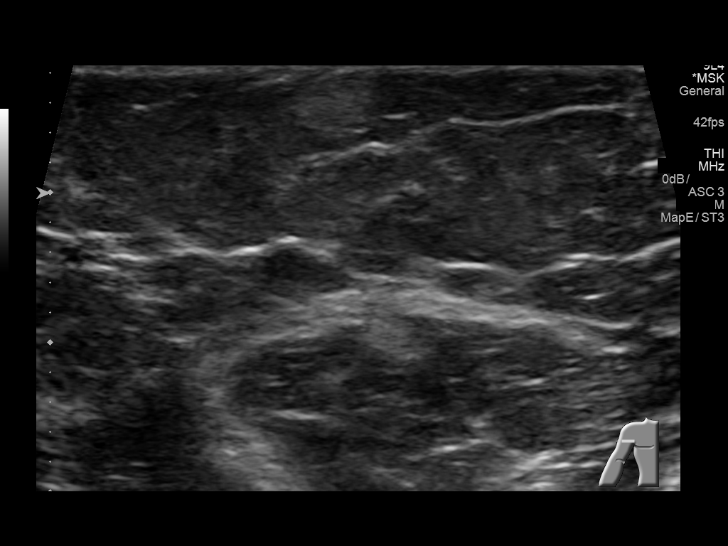
[im 31/31]
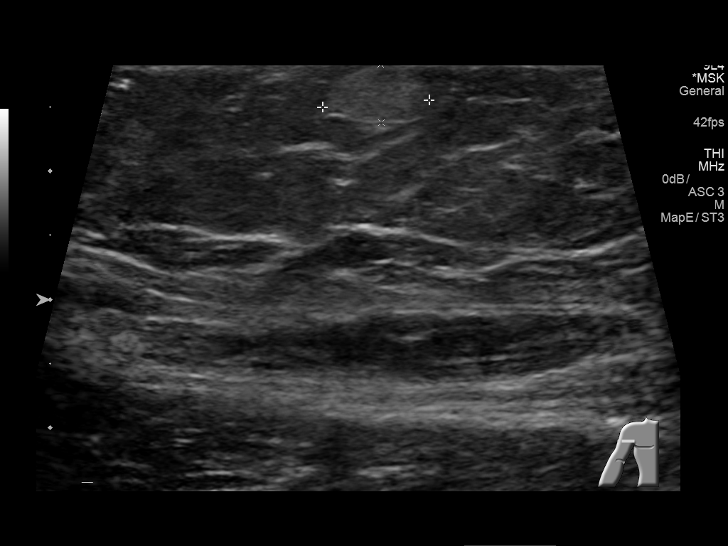

[14 of 25 positions shown; findings below may reference images not displayed]

FINDINGS: 8.3 x 4.5 x 5.5 mm hyperechoic well-circumscribed mass in the left
upper lateral arm within the subcutaneous fat just below the skin
surface without internal Doppler flow. There are other smaller
hyperechoic foci within the subcutaneous fat adjacently without
internal Doppler flow. No posterior acoustic shadowing or
enhancement.

No other solid or cystic mass.
IMPRESSION: Multiple small hyperechoic masses in the subcutaneous fat of the
upper lateral left arm with the largest measuring 8.3 x 4.5 x 5.5 mm
likely reflecting small lipomas.

## 2019-01-18 DIAGNOSIS — H52201 Unspecified astigmatism, right eye: Secondary | ICD-10-CM | POA: Diagnosis not present

## 2019-01-18 DIAGNOSIS — H2513 Age-related nuclear cataract, bilateral: Secondary | ICD-10-CM | POA: Diagnosis not present

## 2019-01-18 DIAGNOSIS — H2511 Age-related nuclear cataract, right eye: Secondary | ICD-10-CM | POA: Diagnosis not present

## 2019-01-19 DIAGNOSIS — H2512 Age-related nuclear cataract, left eye: Secondary | ICD-10-CM | POA: Diagnosis not present

## 2019-01-29 DIAGNOSIS — H2513 Age-related nuclear cataract, bilateral: Secondary | ICD-10-CM | POA: Diagnosis not present

## 2019-02-01 DIAGNOSIS — H52202 Unspecified astigmatism, left eye: Secondary | ICD-10-CM | POA: Diagnosis not present

## 2019-02-01 DIAGNOSIS — H2512 Age-related nuclear cataract, left eye: Secondary | ICD-10-CM | POA: Diagnosis not present

## 2019-03-03 DIAGNOSIS — J0100 Acute maxillary sinusitis, unspecified: Secondary | ICD-10-CM | POA: Diagnosis not present

## 2019-03-03 DIAGNOSIS — Z20828 Contact with and (suspected) exposure to other viral communicable diseases: Secondary | ICD-10-CM | POA: Diagnosis not present

## 2019-05-01 ENCOUNTER — Ambulatory Visit: Payer: PPO | Attending: Internal Medicine

## 2019-05-01 ENCOUNTER — Other Ambulatory Visit: Payer: Self-pay

## 2019-05-01 DIAGNOSIS — Z23 Encounter for immunization: Secondary | ICD-10-CM | POA: Insufficient documentation

## 2019-05-01 NOTE — Progress Notes (Signed)
   Covid-19 Vaccination Clinic  Name:  Wanda Burns    MRN: [id number] DOB: [birth year]  05/01/2019  [name] was observed post Covid-19 immunization for 15 minutes without incidence. She was provided with Vaccine Information Sheet and instruction to access the V-Safe system.   [name] was instructed to call 911 with any severe reactions post vaccine: Marland Kitchen Difficulty breathing  . Swelling of your face and throat  . A fast heartbeat  . A bad rash all over your body  . Dizziness and weakness    Immunizations Administered    Name Date Dose VIS Date Route   Pfizer COVID-19 Vaccine 05/01/2019  9:07 AM 0.3 mL 02/19/2019 Intramuscular   Manufacturer: [place]   Lot: [id number]   [place]: [id number]

## 2019-05-25 ENCOUNTER — Ambulatory Visit: Payer: PPO | Attending: Internal Medicine

## 2019-05-25 DIAGNOSIS — Z23 Encounter for immunization: Secondary | ICD-10-CM

## 2019-05-25 NOTE — Progress Notes (Signed)
   Covid-19 Vaccination Clinic  Name:  Wanda Burns    MRN: [id number] DOB: 1950-06-03  05/25/2019  [name] was observed post Covid-19 immunization for 15 minutes without incident. She was provided with Vaccine Information Sheet and instruction to access the V-Safe system.   [name] was instructed to call 911 with any severe reactions post vaccine: Marland Kitchen Difficulty breathing  . Swelling of face and throat  . A fast heartbeat  . A bad rash all over body  . Dizziness and weakness   Immunizations Administered    Name Date Dose VIS Date Route   Pfizer COVID-19 Vaccine 05/25/2019  8:33 AM 0.3 mL 02/19/2019 Intramuscular   Manufacturer: [place]   Lot: [id number]   [place]: [place] COVID-19 Vaccine 05/25/2019  8:35 AM 0.3 mL 02/19/2019 Intramuscular   Manufacturer: [place]   Lot: [id number]   [place]: [id number]

## 2019-08-11 DIAGNOSIS — R5383 Other fatigue: Secondary | ICD-10-CM | POA: Diagnosis not present

## 2019-08-11 DIAGNOSIS — R635 Abnormal weight gain: Secondary | ICD-10-CM | POA: Diagnosis not present

## 2019-08-11 DIAGNOSIS — R079 Chest pain, unspecified: Secondary | ICD-10-CM | POA: Diagnosis not present

## 2019-08-11 DIAGNOSIS — R0602 Shortness of breath: Secondary | ICD-10-CM | POA: Diagnosis not present

## 2019-08-11 DIAGNOSIS — E781 Pure hyperglyceridemia: Secondary | ICD-10-CM | POA: Diagnosis not present

## 2019-08-11 DIAGNOSIS — R7301 Impaired fasting glucose: Secondary | ICD-10-CM | POA: Diagnosis not present

## 2019-08-19 DIAGNOSIS — R079 Chest pain, unspecified: Secondary | ICD-10-CM | POA: Diagnosis not present

## 2019-08-19 DIAGNOSIS — E669 Obesity, unspecified: Secondary | ICD-10-CM | POA: Diagnosis not present

## 2019-08-19 DIAGNOSIS — R5383 Other fatigue: Secondary | ICD-10-CM | POA: Diagnosis not present

## 2019-08-19 DIAGNOSIS — R7303 Prediabetes: Secondary | ICD-10-CM | POA: Diagnosis not present

## 2019-08-19 DIAGNOSIS — R0602 Shortness of breath: Secondary | ICD-10-CM | POA: Diagnosis not present

## 2019-08-19 DIAGNOSIS — E785 Hyperlipidemia, unspecified: Secondary | ICD-10-CM | POA: Diagnosis not present

## 2019-09-16 DIAGNOSIS — E669 Obesity, unspecified: Secondary | ICD-10-CM | POA: Diagnosis not present

## 2019-09-16 DIAGNOSIS — Z8249 Family history of ischemic heart disease and other diseases of the circulatory system: Secondary | ICD-10-CM | POA: Diagnosis not present

## 2019-09-16 DIAGNOSIS — Z79899 Other long term (current) drug therapy: Secondary | ICD-10-CM | POA: Diagnosis not present

## 2019-09-16 DIAGNOSIS — R0602 Shortness of breath: Secondary | ICD-10-CM | POA: Diagnosis not present

## 2019-09-16 DIAGNOSIS — E785 Hyperlipidemia, unspecified: Secondary | ICD-10-CM | POA: Diagnosis not present

## 2019-09-16 DIAGNOSIS — R5383 Other fatigue: Secondary | ICD-10-CM | POA: Diagnosis not present

## 2019-09-16 DIAGNOSIS — Z87891 Personal history of nicotine dependence: Secondary | ICD-10-CM | POA: Diagnosis not present

## 2019-09-16 DIAGNOSIS — Z6838 Body mass index (BMI) 38.0-38.9, adult: Secondary | ICD-10-CM | POA: Diagnosis not present

## 2019-09-16 DIAGNOSIS — R079 Chest pain, unspecified: Secondary | ICD-10-CM | POA: Diagnosis not present

## 2019-09-16 DIAGNOSIS — R7303 Prediabetes: Secondary | ICD-10-CM | POA: Diagnosis not present

## 2019-09-16 DIAGNOSIS — F419 Anxiety disorder, unspecified: Secondary | ICD-10-CM | POA: Diagnosis not present

## 2019-10-06 ENCOUNTER — Encounter (HOSPITAL_COMMUNITY): Payer: Self-pay | Admitting: *Deleted

## 2019-10-06 ENCOUNTER — Emergency Department (HOSPITAL_COMMUNITY)
Admission: EM | Admit: 2019-10-06 | Discharge: 2019-10-06 | Disposition: A | Discharge status: Home / Self Care | Payer: PPO | Attending: Emergency Medicine | Admitting: Emergency Medicine

## 2019-10-06 ENCOUNTER — Other Ambulatory Visit: Payer: Self-pay

## 2019-10-06 ENCOUNTER — Emergency Department (HOSPITAL_COMMUNITY): Payer: PPO

## 2019-10-06 DIAGNOSIS — I708 Atherosclerosis of other arteries: Secondary | ICD-10-CM | POA: Diagnosis not present

## 2019-10-06 DIAGNOSIS — N3001 Acute cystitis with hematuria: Secondary | ICD-10-CM | POA: Insufficient documentation

## 2019-10-06 DIAGNOSIS — Z87891 Personal history of nicotine dependence: Secondary | ICD-10-CM | POA: Diagnosis not present

## 2019-10-06 DIAGNOSIS — K760 Fatty (change of) liver, not elsewhere classified: Secondary | ICD-10-CM | POA: Diagnosis not present

## 2019-10-06 DIAGNOSIS — R109 Unspecified abdominal pain: Secondary | ICD-10-CM | POA: Diagnosis not present

## 2019-10-06 DIAGNOSIS — K5730 Diverticulosis of large intestine without perforation or abscess without bleeding: Secondary | ICD-10-CM | POA: Diagnosis not present

## 2019-10-06 DIAGNOSIS — I700 Atherosclerosis of aorta: Secondary | ICD-10-CM | POA: Diagnosis not present

## 2019-10-06 LAB — BASIC METABOLIC PANEL
Anion gap: 10 (ref 5–15)
BUN: 21 mg/dL (ref 8–23)
CO2: 22 mmol/L (ref 22–32)
Calcium: 9.3 mg/dL (ref 8.9–10.3)
Chloride: 108 mmol/L (ref 98–111)
Creatinine, Ser: 1.14 mg/dL — ABNORMAL HIGH (ref 0.44–1.00)
GFR calc Af Amer: 58 mL/min — ABNORMAL LOW (ref >60)
GFR calc non Af Amer: 50 mL/min — ABNORMAL LOW (ref >60)
Glucose, Bld: 181 mg/dL — ABNORMAL HIGH (ref 70–99)
Potassium: 4.3 mmol/L (ref 3.5–5.1)
Sodium: 140 mmol/L (ref 135–145)

## 2019-10-06 LAB — URINALYSIS, MICROSCOPIC (REFLEX)

## 2019-10-06 LAB — CBC
HCT: 38.1 % (ref 36.0–46.0)
Hemoglobin: 11.9 g/dL — ABNORMAL LOW (ref 12.0–15.0)
MCH: 27 pg (ref 26.0–34.0)
MCHC: 31.2 g/dL (ref 30.0–36.0)
MCV: 86.4 fL (ref 80.0–100.0)
Platelets: 339 10*3/uL (ref 150–400)
RBC: 4.41 MIL/uL (ref 3.87–5.11)
RDW: 13.8 % (ref 11.5–15.5)
WBC: 11.7 10*3/uL — ABNORMAL HIGH (ref 4.0–10.5)
nRBC: 0 % (ref 0.0–0.2)

## 2019-10-06 LAB — URINALYSIS, ROUTINE W REFLEX MICROSCOPIC
Glucose, UA: NEGATIVE mg/dL
Ketones, ur: NEGATIVE mg/dL
Leukocytes,Ua: NEGATIVE
Nitrite: NEGATIVE
Protein, ur: 100 mg/dL — AB
Specific Gravity, Urine: >1.03 — ABNORMAL HIGH (ref 1.005–1.030)
pH: 5.5 (ref 5.0–8.0)

## 2019-10-06 MED ORDER — SODIUM CHLORIDE 0.9 % IV SOLN
1.0000 g | Freq: Once | INTRAVENOUS | Status: AC
  Administered 2019-10-06: 1 g via INTRAVENOUS
  Filled 2019-10-06: qty 10

## 2019-10-06 MED ORDER — CEPHALEXIN 500 MG PO CAPS
500.0000 mg | ORAL_CAPSULE | Freq: Three times a day (TID) | ORAL | 0 refills | Status: DC
Start: 2019-10-06 — End: 2021-03-09

## 2019-10-06 MED ORDER — POLYETHYLENE GLYCOL 3350 17 GM/SCOOP PO POWD
17.0000 g | Freq: Every day | ORAL | 0 refills | Status: AC
Start: 2019-10-06 — End: ?

## 2019-10-06 MED ORDER — KETOROLAC TROMETHAMINE 15 MG/ML IJ SOLN
15.0000 mg | Freq: Once | INTRAMUSCULAR | Status: AC
  Administered 2019-10-06: 15 mg via INTRAVENOUS
  Filled 2019-10-06: qty 1

## 2019-10-06 NOTE — Discharge Instructions (Signed)
Please read and follow all provided instructions.  Your diagnoses today include:  1. Acute cystitis with hematuria   2. Flank pain     Tests performed today include: Urine test - suggests that you have an infection in your bladder Blood counts and electrolytes -slightly high white blood cell count, slightly weak kidney function CT scan of your abdomen and pelvis -no kidney stones or other signs of infection today Vital signs. See below for your results today.   Medications prescribed:  Keflex (cephalexin) - antibiotic  You have been prescribed an antibiotic medicine: take the entire course of medicine even if you are feeling better. Stopping early can cause the antibiotic not to work.  Miralax - laxative  This medication can be found over-the-counter.   Home care instructions:  Follow any educational materials contained in this packet.  Follow-up instructions: Please follow-up with your primary care provider in 3 days if symptoms are not resolved for further evaluation of your symptoms.  Return instructions:  Please return to the Emergency Department if you experience worsening symptoms.  Return with fever, worsening pain, persistent vomiting, worsening pain in your back.  Please return if you have any other emergent concerns.  Additional Information:  Your vital signs today were: BP (!) 138/77 (BP Location: Right Arm)   Pulse 92   Temp 98.3 F (36.8 C) (Oral)   Resp 16   SpO2 100%  If your blood pressure (BP) was elevated above 135/85 this visit, please have this repeated by your doctor within one month. --------------

## 2019-10-06 NOTE — ED Provider Notes (Signed)
[facility] EMERGENCY DEPARTMENT Provider Note   CSN: [id number] Arrival date & time: 10/06/19  0962     History Chief Complaint  Patient presents with  . Flank Pain    Wanda Burns is a 69 y.o. female.  Patient with history of hysterectomy, diverticulitis, no other abdominal surgeries or kidney stones --presents to the emergency department today with complaint of difficulty urinating, burning with urination, decreased urination, left hip and flank pain.  Patient went to bed last night in a normal state of health.  She was active yesterday and gave blood.  This morning when she woke up she felt the need to use the restroom.  She was unable to urinate and had a burning sensation in the groin area.  This progressively worsened and spread up to the left hip area and eventually to the left flank.  Pain was severe at times and she had 4 episodes of vomiting.  She attempted to drink water and take Tylenol however continue to vomit.  After arrival to the emergency department, pain is improved somewhat.  She denies chest pain or shortness of breath.  No reported fevers.  Patient is fully vaccinated for Covid.  No hematuria noted.  She was able to have a very small bowel movement this morning without blood.  Symptoms currently feel different than previous urine infections.        Past Medical History:  Diagnosis Date  . Anxiety   . Arthritis    fingers  . Chest pain   . Constipation   . Depression   . Diverticulosis    mild on colonoscopy 2012  . Dyspnea on exertion   . Hay fever   . History of chicken pox   . History of smoking    quit 11/2010  . HLD (hyperlipidemia)   . Lactose intolerance   . Leg edema   . Obesity   . Rheumatoid arthritis ([place])   . Swallowing difficulty     Patient Active Problem List   Diagnosis Date Noted  . Insulin resistance 06/10/2018  . Prediabetes 03/18/2018  . Class 1 obesity with serious comorbidity and body mass index (BMI) of  31.0 to 31.9 in adult 03/18/2018  . Other fatigue 12/09/2017  . Shortness of breath on exertion 12/09/2017  . Hyperglycemia 12/09/2017  . Vitamin D deficiency 12/09/2017  . Other hyperlipidemia 12/09/2017  . Skin lesion 04/15/2011  . Sinusitis 12/17/2010  . Left-sided chest wall pain 09/12/2010  . Healthcare maintenance 09/12/2010  . History of smoking 09/12/2010  . Depression     Past Surgical History:  Procedure Laterality Date  . abd [place]  2006   nothing acute, no gallbladder stones  . bilateral tubal    . BUNIONECTOMY Right   . COLONOSCOPY  2012   WNL, mild diverticulosis, rpt 10 yrs  . LIPOMA EXCISION    . LIPOMA EXCISION Right 10/02/2017   Procedure: EXCISION OF SUBCUTANEOUS LIPOMA OF RIGHT UPPER LATERAL ARM;  Surgeon: Donnie Mesa, MD;  Location: [facility];  Service: General;  Laterality: Right;  . TUBAL LIGATION    . VAGINAL HYSTERECTOMY  1990     OB History    Gravida  0   Para  0   Term  0   Preterm  0   AB  0   Living  0     SAB  0   TAB  0   Ectopic  0   Multiple  0  Live Births  0           Family History  Problem Relation Age of Onset  . Diabetes Mother   . Hypertension Mother   . Emphysema Mother   . Coronary artery disease Mother 20  . Hyperlipidemia Mother   . Depression Mother   . Anxiety disorder Mother   . Alcohol abuse Mother   . Obesity Mother   . Coronary artery disease Father 71       MI death  . Alcohol abuse Father   . Sudden death Father   . Diabetes Brother   . Diabetes Maternal Aunt   . Colon cancer Maternal Aunt   . Colon cancer Maternal Uncle 40  . Diabetes Maternal Uncle   . Stroke Maternal Grandmother     Social History   Tobacco Use  . Smoking status: Former Smoker    Packs/day: 1.00    Types: Cigarettes    Quit date: 11/10/2010    Years since quitting: 8.9  . Smokeless tobacco: Never Used  . Tobacco comment: Less than 1/2 PPD  Substance Use Topics  . Alcohol use: Yes     Alcohol/week: 1.0 standard drink    Types: 1 Standard drinks or equivalent per week    Comment: social  . Drug use: No    Home Medications Prior to Admission medications   Medication Sig Start Date End Date Taking? Authorizing Provider  buPROPion (WELLBUTRIN XL) 300 MG 24 hr tablet Take 300 mg by mouth daily.    [provider]  cholecalciferol (VITAMIN D3) 25 MCG (1000 UT) tablet Take 2,000 Units by mouth daily.    [provider]  LORazepam (ATIVAN) 1 MG tablet Take 0.5 mg by mouth 2 (two) times daily as needed.     [provider]  PARoxetine (PAXIL) 10 MG tablet Take 10 mg by mouth daily.    [provider]    Allergies    Codeine and Prednisone  Review of Systems   Review of Systems  Constitutional: Negative for fever.  HENT: Negative for rhinorrhea and sore throat.   Eyes: Negative for redness.  Respiratory: Negative for cough.   Cardiovascular: Negative for chest pain.  Gastrointestinal: Positive for nausea and vomiting. Negative for abdominal pain and diarrhea.  Genitourinary: Positive for difficulty urinating, dysuria, flank pain and urgency. Negative for frequency and hematuria.  Musculoskeletal: Negative for myalgias.  Skin: Negative for rash.  Neurological: Negative for headaches.    Physical Exam Updated Vital Signs BP (!) 131/75 (BP Location: Right Arm)   Pulse 84   Temp 97.6 F (36.4 C) (Oral)   Resp 15   SpO2 97%   Physical Exam Vitals and nursing note reviewed.  Constitutional:      General: She is not in acute distress.    Appearance: She is well-developed.  HENT:     Head: Normocephalic and atraumatic.     Right Ear: External ear normal.     Left Ear: External ear normal.     Nose: Nose normal.  Eyes:     Conjunctiva/sclera: Conjunctivae normal.  Cardiovascular:     Rate and Rhythm: Normal rate and regular rhythm.     Heart sounds: No murmur heard.   Pulmonary:     Effort: No respiratory distress.      Breath sounds: No wheezing, rhonchi or rales.  Abdominal:     Palpations: Abdomen is soft.     Tenderness: There is abdominal tenderness. There is no  guarding or rebound.     Comments: Mild L lateral abd tenderness.  Musculoskeletal:     Cervical back: Normal range of motion and neck supple.     Right lower leg: No edema.     Left lower leg: No edema.  Skin:    General: Skin is warm and dry.     Findings: No rash.  Neurological:     General: No focal deficit present.     Mental Status: She is alert. Mental status is at baseline.     Motor: No weakness.  Psychiatric:        Mood and Affect: Mood normal.     ED Results / Procedures / Treatments   Labs (all labs ordered are listed, but only abnormal results are displayed) Labs Reviewed  URINALYSIS, ROUTINE W REFLEX MICROSCOPIC - Abnormal; Notable for the following components:      Result Value   APPearance CLOUDY (*)    Specific Gravity, Urine >1.030 (*)    Hgb urine dipstick LARGE (*)    Bilirubin Urine SMALL (*)    Protein, ur 100 (*)    All other components within normal limits  BASIC METABOLIC PANEL - Abnormal; Notable for the following components:   Glucose, Bld 181 (*)    Creatinine, Ser 1.14 (*)    GFR calc non Af Amer 50 (*)    GFR calc Af Amer 58 (*)    All other components within normal limits  CBC - Abnormal; Notable for the following components:   WBC 11.7 (*)    Hemoglobin 11.9 (*)    All other components within normal limits  URINALYSIS, MICROSCOPIC (REFLEX) - Abnormal; Notable for the following components:   Bacteria, UA MANY (*)    All other components within normal limits  URINE CULTURE    EKG None  Radiology CT Renal Stone Study  Result Date: 10/06/2019 CLINICAL DATA:  Left flank pain with dysuria EXAM: CT ABDOMEN AND PELVIS WITHOUT CONTRAST TECHNIQUE: Multidetector CT imaging of the abdomen and pelvis was performed following the standard protocol without oral or IV contrast. COMPARISON:  None.  FINDINGS: Lower chest: There is mild scarring in the anterior right base. Lung bases otherwise are clear. Hepatobiliary: There is hepatic steatosis. No focal liver lesions are appreciable on this noncontrast enhanced study. Gallbladder wall is not appreciably thickened. There is no appreciable biliary duct dilatation. Pancreas: There is no evident pancreatic mass or inflammatory focus. Spleen: No splenic lesions are appreciable. Adrenals/Urinary Tract: Adrenals bilaterally appear unremarkable. There is no evident renal mass or hydronephrosis on either side. There is no intrarenal or ureteral calculus on either side. The urinary bladder is midline with wall thickness within normal limits. Stomach/Bowel: There are multiple sigmoid diverticula as well as occasional descending colonic diverticula. No appreciable diverticulitis. There is no evident bowel wall or mesenteric thickening. The terminal ileum appears normal. No bowel obstruction. No free air or portal venous air. Vascular/Lymphatic: There is aortic and iliac artery atherosclerosis. No evident abdominal aortic aneurysm. No adenopathy is appreciable in the abdomen or pelvis. Reproductive: Uterus absent.  No pelvic mass. Other: Appendix appears normal. No abscess or ascites evident in the abdomen or pelvis. There is mild fat in the umbilicus. No bowel containing hernia appreciable. Musculoskeletal: No blastic or lytic bone lesions. No intramuscular lesions are evident. IMPRESSION: 1. No evident renal or ureteral calculus. No hydronephrosis. Urinary bladder wall thickness within normal limits. 2. Left-sided colonic diverticulosis without frank diverticulitis. No evident bowel obstruction. No  abscess in the abdomen or pelvis. Appendix appears normal. 3.  Aortic Atherosclerosis (ICD10-I70.0). 4.  Uterus absent. Electronically Signed   By: Lowella Grip III M.D.   On: 10/06/2019 11:32    Procedures Procedures (including critical care time)  Medications  Ordered in ED Medications  cefTRIAXone (ROCEPHIN) 1 g in sodium chloride 0.9 % 100 mL IVPB (0 g Intravenous Stopped 10/06/19 1336)  ketorolac (TORADOL) 15 MG/ML injection 15 mg (15 mg Intravenous Given 10/06/19 1259)    ED Course  I have reviewed the triage vital signs and the nursing notes.  Pertinent labs & imaging results that were available during my care of the patient were reviewed by me and considered in my medical decision making (see chart for details).  Patient seen and examined.  Labs reviewed.  Patient will need bladder scan to evaluate degree of urinary retention as she has not had a full void.  Will obtain CT renal protocol to evaluate for left-sided ureteral stone and rule out other intra-abdominal etiology such as diverticulitis.  Patient's pain is currently controlled.  Vital signs reviewed and are as follows: BP (!) 131/75 (BP Location: Right Arm)   Pulse 84   Temp 97.6 F (36.4 C) (Oral)   Resp 15   SpO2 97%   1:52 PM CT personally reviewed.  Other than large stool burden, negative for kidney stone or other problems.  UA suggestive of UTI.  Patient will be given Rocephin.  Urine culture sent and is pending.  Patient discussed with and seen by Dr. Reather Converse.  Patient rechecked, abdomen is soft and nontender.  Her pain is controlled.  She is ready to go home.  Plan home with Keflex and MiraLAX.  Encourage PCP follow-up in the next 3 days for recheck. The patient was urged to return to the Emergency Department immediately with worsening of current symptoms, worsening abdominal pain, persistent vomiting, blood noted in stools, fever, or any other concerns. The patient verbalized understanding.     MDM Rules/Calculators/A&P                          Patient presents the emergency department today with urine difficulty as well as flank pain.  CT scan without kidney stone or other problem.  UA with some white cells and blood --suspect UTI given overall symptomatology.  White  blood cell count and creatinine are slightly elevated.  Patient appears nontoxic and I do not suspect sepsis.  Symptoms resolved at time of discharge.  Patient looks well.  Plan as above.    Final Clinical Impression(s) / ED Diagnoses Final diagnoses:  Acute cystitis with hematuria  Flank pain    Rx / DC Orders ED Discharge Orders         Ordered    cephALEXin (KEFLEX) 500 MG capsule  3 times daily     Discontinue  Reprint     10/06/19 1349    polyethylene glycol powder (GLYCOLAX/MIRALAX) 17 GM/SCOOP powder  Daily     Discontinue  Reprint     10/06/19 1349           Carlisle Cater, PA-C 10/06/19 1354    Elnora Morrison, MD 10/06/19 1542

## 2019-10-06 NOTE — ED Notes (Signed)
Patient Alert and oriented to baseline. Stable and ambulatory to baseline. Patient verbalized understanding of the discharge instructions.  Patient belongings were taken by the patient.   

## 2019-10-06 NOTE — ED Triage Notes (Signed)
Pt states she has pain around her urethral area, radiates up through the left flank. Urinating only small amounts. Has had vomiting.

## 2019-10-08 LAB — URINE CULTURE: Culture: >=100000 — AB

## 2019-10-10 ENCOUNTER — Telehealth (HOSPITAL_BASED_OUTPATIENT_CLINIC_OR_DEPARTMENT_OTHER): Payer: Self-pay | Admitting: Emergency Medicine

## 2019-10-10 NOTE — Telephone Encounter (Signed)
Post ED Visit - Positive Culture Follow-up  Culture report reviewed by antimicrobial stewardship pharmacist: [facility] []  Elenor Quinones, Pharm.D. []  Heide Guile, Pharm.D., BCPS AQ-ID []  Parks Neptune, Pharm.D., BCPS []  Alycia Rossetti, Pharm.D., BCPS []  [place], Pharm.D., BCPS, AAHIVP []  Legrand Como, Pharm.D., BCPS, AAHIVP []  Salome Arnt, PharmD, BCPS []  Johnnette Gourd, PharmD, BCPS []  Hughes Better, PharmD, BCPS [x]  Duanne Limerick, PharmD []  Laqueta Linden, PharmD, BCPS []  Albertina Parr, PharmD  [facility] [facility] []  Leodis Sias, PharmD []  Lindell Spar, PharmD []  Royetta Asal, PharmD []  Graylin Shiver, Rph []  Rema Fendt) Glennon Mac, PharmD []  Arlyn Dunning, PharmD []  Netta Cedars, PharmD []  Dia Sitter, PharmD []  Leone Haven, PharmD []  Gretta Arab, PharmD []  Theodis Shove, PharmD []  Peggyann Juba, PharmD []  Reuel Boom, PharmD   Positive urine culture Treated with Cephalexin, organism sensitive to the same and no further patient follow-up is required at this time.  Wanda Burns 10/10/2019, 11:25 AM

## 2019-10-26 DIAGNOSIS — R7301 Impaired fasting glucose: Secondary | ICD-10-CM | POA: Diagnosis not present

## 2019-10-26 DIAGNOSIS — Z0000 Encounter for general adult medical examination without abnormal findings: Secondary | ICD-10-CM | POA: Diagnosis not present

## 2019-10-26 DIAGNOSIS — R350 Frequency of micturition: Secondary | ICD-10-CM | POA: Diagnosis not present

## 2019-10-26 DIAGNOSIS — F419 Anxiety disorder, unspecified: Secondary | ICD-10-CM | POA: Diagnosis not present

## 2019-10-26 DIAGNOSIS — M545 Low back pain: Secondary | ICD-10-CM | POA: Diagnosis not present

## 2019-10-26 DIAGNOSIS — F5102 Adjustment insomnia: Secondary | ICD-10-CM | POA: Diagnosis not present

## 2019-12-16 DIAGNOSIS — Z23 Encounter for immunization: Secondary | ICD-10-CM | POA: Diagnosis not present

## 2020-04-27 DIAGNOSIS — E7800 Pure hypercholesterolemia, unspecified: Secondary | ICD-10-CM | POA: Diagnosis not present

## 2020-04-27 DIAGNOSIS — E781 Pure hyperglyceridemia: Secondary | ICD-10-CM | POA: Diagnosis not present

## 2020-04-27 DIAGNOSIS — E559 Vitamin D deficiency, unspecified: Secondary | ICD-10-CM | POA: Diagnosis not present

## 2020-04-27 DIAGNOSIS — R7301 Impaired fasting glucose: Secondary | ICD-10-CM | POA: Diagnosis not present

## 2020-05-02 DIAGNOSIS — R7301 Impaired fasting glucose: Secondary | ICD-10-CM | POA: Diagnosis not present

## 2020-05-02 DIAGNOSIS — F5102 Adjustment insomnia: Secondary | ICD-10-CM | POA: Diagnosis not present

## 2020-05-02 DIAGNOSIS — Z87891 Personal history of nicotine dependence: Secondary | ICD-10-CM | POA: Diagnosis not present

## 2020-05-02 DIAGNOSIS — E559 Vitamin D deficiency, unspecified: Secondary | ICD-10-CM | POA: Diagnosis not present

## 2020-05-02 DIAGNOSIS — L819 Disorder of pigmentation, unspecified: Secondary | ICD-10-CM | POA: Diagnosis not present

## 2020-05-02 DIAGNOSIS — E781 Pure hyperglyceridemia: Secondary | ICD-10-CM | POA: Diagnosis not present

## 2020-05-02 DIAGNOSIS — F419 Anxiety disorder, unspecified: Secondary | ICD-10-CM | POA: Diagnosis not present

## 2020-05-10 DIAGNOSIS — D485 Neoplasm of uncertain behavior of skin: Secondary | ICD-10-CM | POA: Insufficient documentation

## 2020-05-10 DIAGNOSIS — I781 Nevus, non-neoplastic: Secondary | ICD-10-CM | POA: Diagnosis not present

## 2020-05-10 DIAGNOSIS — C44529 Squamous cell carcinoma of skin of other part of trunk: Secondary | ICD-10-CM | POA: Diagnosis not present

## 2020-07-13 IMAGING — CR CHEST - 2 VIEW
2 series · 2 of 2 positions shown · non-contrast
Comparison: 02/13/2017

CLINICAL DATA: Left chest pain

EXAM:
CHEST - 2 VIEW

[w chest pa]
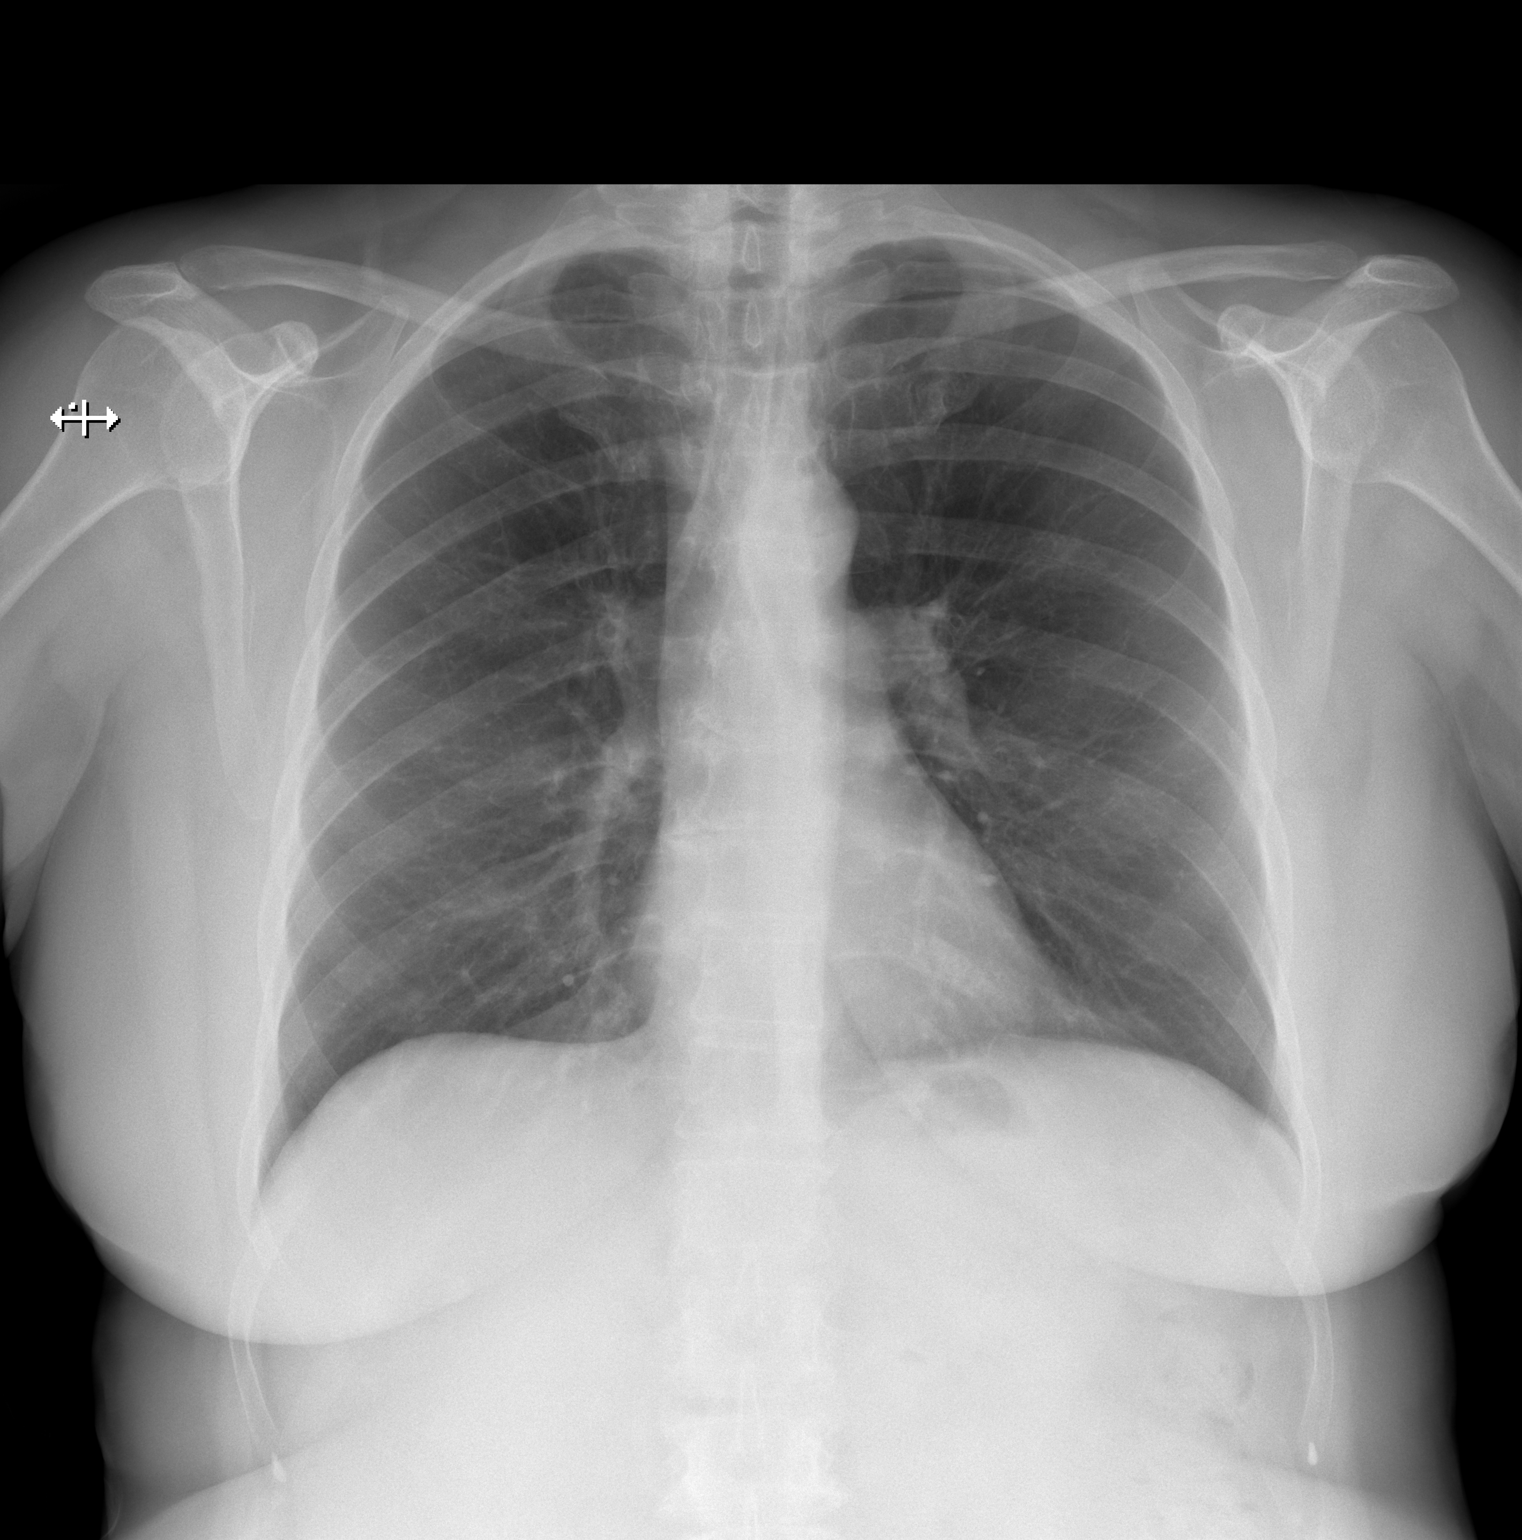

[w chest lat]
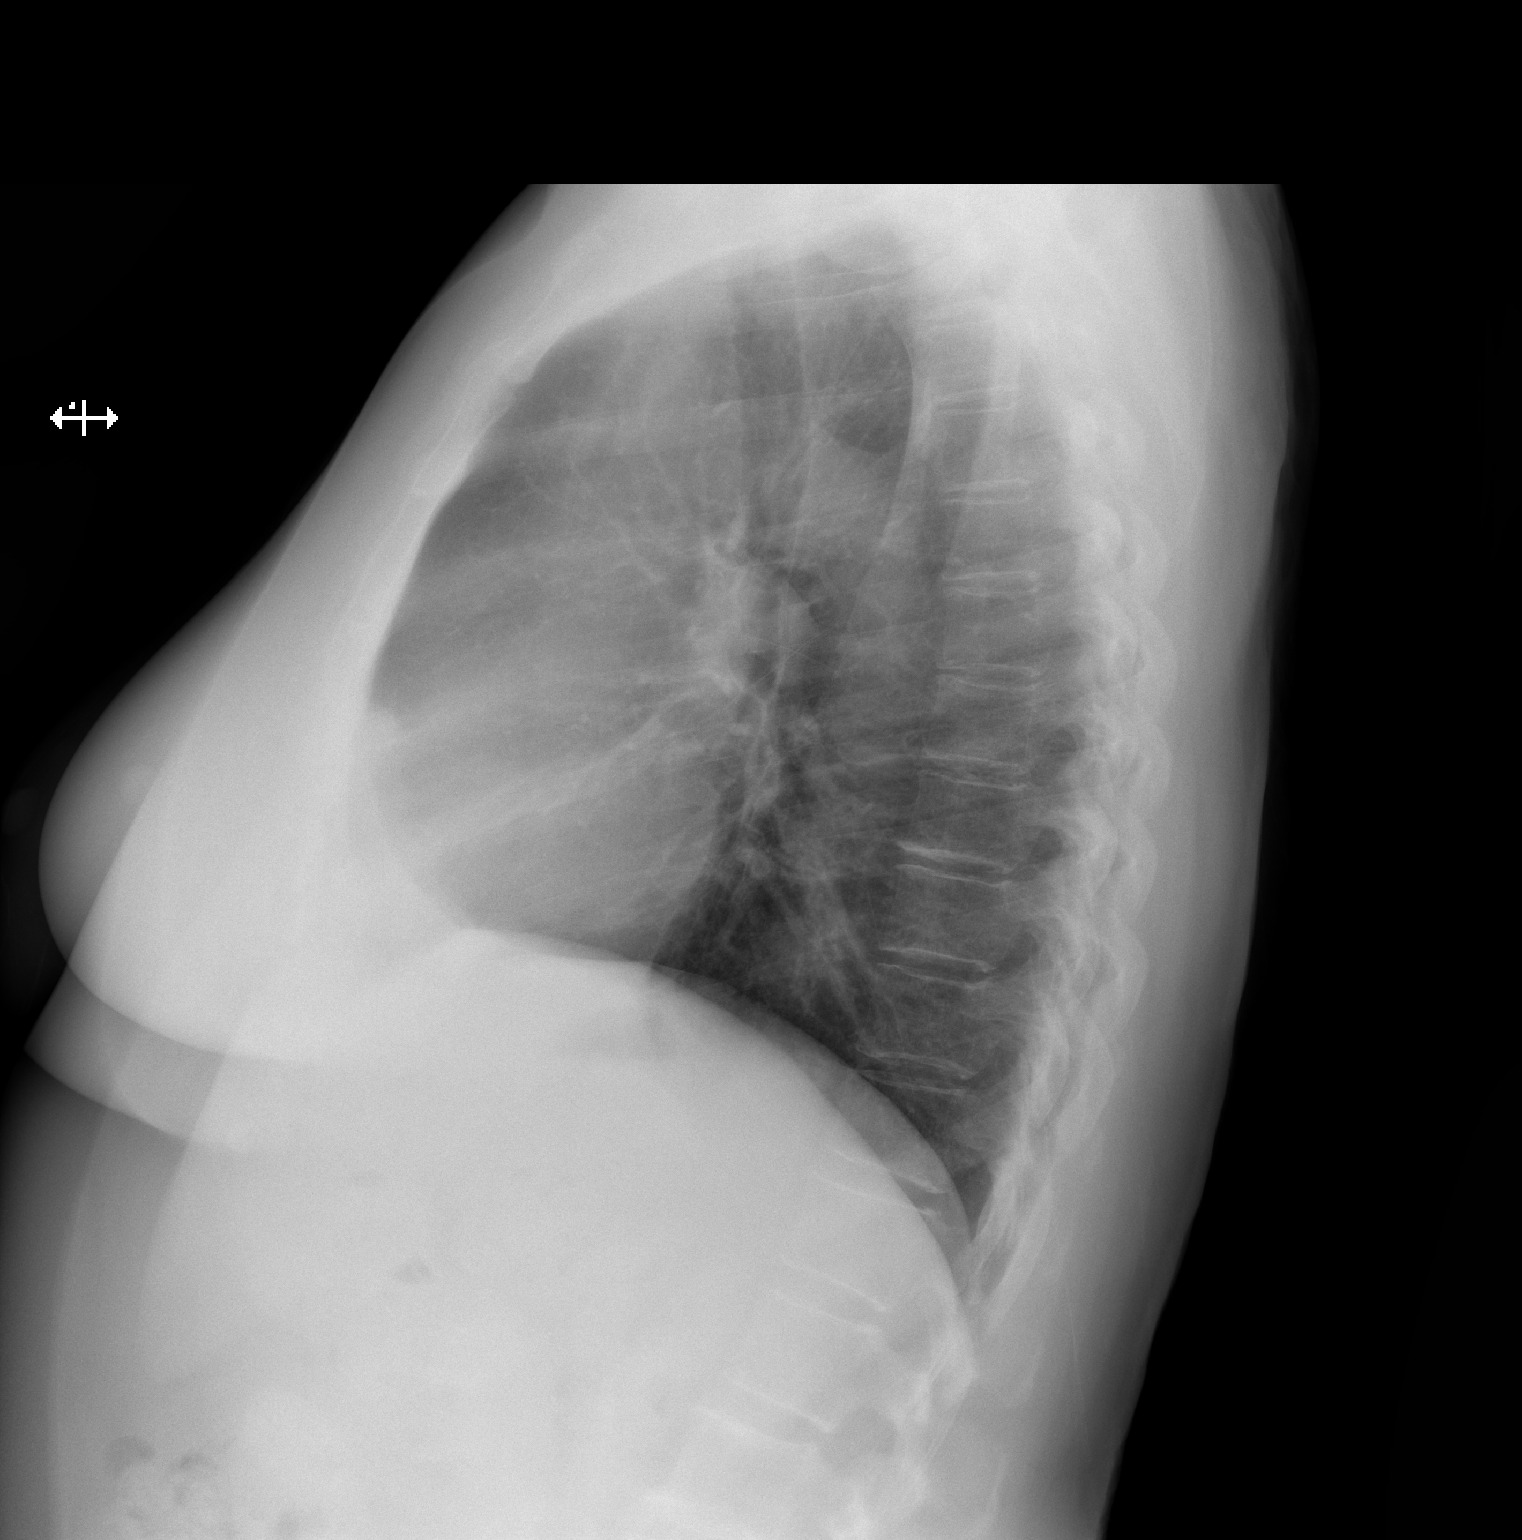

[2 of 2 positions shown; findings below may reference images not displayed]

FINDINGS: Heart and mediastinal contours are within normal limits. No focal
opacities or effusions. No acute bony abnormality.
IMPRESSION: No active cardiopulmonary disease.

## 2020-08-24 DIAGNOSIS — J0140 Acute pansinusitis, unspecified: Secondary | ICD-10-CM | POA: Diagnosis not present

## 2020-08-24 DIAGNOSIS — J3489 Other specified disorders of nose and nasal sinuses: Secondary | ICD-10-CM | POA: Diagnosis not present

## 2020-09-14 ENCOUNTER — Encounter: Payer: Self-pay | Admitting: Gastroenterology

## 2020-09-20 DIAGNOSIS — D485 Neoplasm of uncertain behavior of skin: Secondary | ICD-10-CM | POA: Diagnosis not present

## 2020-09-20 DIAGNOSIS — L918 Other hypertrophic disorders of the skin: Secondary | ICD-10-CM | POA: Diagnosis not present

## 2020-09-20 DIAGNOSIS — L570 Actinic keratosis: Secondary | ICD-10-CM | POA: Diagnosis not present

## 2020-10-11 DIAGNOSIS — H40023 Open angle with borderline findings, high risk, bilateral: Secondary | ICD-10-CM | POA: Diagnosis not present

## 2020-10-11 DIAGNOSIS — H16229 Keratoconjunctivitis sicca, not specified as Sjogren's, unspecified eye: Secondary | ICD-10-CM | POA: Diagnosis not present

## 2020-11-02 DIAGNOSIS — E559 Vitamin D deficiency, unspecified: Secondary | ICD-10-CM | POA: Diagnosis not present

## 2020-11-02 DIAGNOSIS — R7301 Impaired fasting glucose: Secondary | ICD-10-CM | POA: Diagnosis not present

## 2020-11-02 DIAGNOSIS — E7800 Pure hypercholesterolemia, unspecified: Secondary | ICD-10-CM | POA: Diagnosis not present

## 2020-11-06 DIAGNOSIS — Z0000 Encounter for general adult medical examination without abnormal findings: Secondary | ICD-10-CM | POA: Diagnosis not present

## 2020-11-06 DIAGNOSIS — R7301 Impaired fasting glucose: Secondary | ICD-10-CM | POA: Diagnosis not present

## 2020-11-06 DIAGNOSIS — X500XXA Overexertion from strenuous movement or load, initial encounter: Secondary | ICD-10-CM | POA: Diagnosis not present

## 2020-11-06 DIAGNOSIS — F5102 Adjustment insomnia: Secondary | ICD-10-CM | POA: Diagnosis not present

## 2020-11-06 DIAGNOSIS — R635 Abnormal weight gain: Secondary | ICD-10-CM | POA: Diagnosis not present

## 2020-11-06 DIAGNOSIS — Z87891 Personal history of nicotine dependence: Secondary | ICD-10-CM | POA: Diagnosis not present

## 2020-11-06 DIAGNOSIS — S99912A Unspecified injury of left ankle, initial encounter: Secondary | ICD-10-CM | POA: Diagnosis not present

## 2021-01-05 ENCOUNTER — Encounter: Payer: Self-pay | Admitting: Gastroenterology

## 2021-03-09 ENCOUNTER — Other Ambulatory Visit: Payer: Self-pay

## 2021-03-09 ENCOUNTER — Ambulatory Visit (AMBULATORY_SURGERY_CENTER): Payer: PPO

## 2021-03-09 VITALS — Ht 62.0 in | Wt 196.0 lb

## 2021-03-09 DIAGNOSIS — Z1211 Encounter for screening for malignant neoplasm of colon: Secondary | ICD-10-CM

## 2021-03-09 NOTE — Progress Notes (Signed)
Pre visit completed via phone call; Patient verified name, DOB, and address; No egg or soy allergy known to patient  No issues known to pt with past sedation with any surgeries or procedures Patient denies ever being told they had issues or difficulty with intubation  No FH of Malignant Hyperthermia Pt is not on diet pills Pt is not on home 02  Pt is not on blood thinners  Pt reports issues with constipation - on daily Miralax-will continue and also advised to increase fluids, activity, use additional  doses Miralax  No A fib or A flutter Pt is fully vaccinated for Covid x 2 + booster; NO PA's for preps discussed with pt in PV today  Discussed with pt there will be an out-of-pocket cost for prep and that varies from $0 to 70 + dollars - pt verbalized understanding  Due to the COVID-19 pandemic we are asking patients to follow certain guidelines in PV and the [place]   Pt aware of COVID protocols and [place] guidelines    Patient given written instructions to not take her Metformin the AM of her procedure; Patient verbalized understanding of information/instructions;

## 2021-03-19 ENCOUNTER — Encounter: Payer: Self-pay | Admitting: Gastroenterology

## 2021-03-23 ENCOUNTER — Ambulatory Visit (AMBULATORY_SURGERY_CENTER): Payer: PPO | Admitting: Gastroenterology

## 2021-03-23 ENCOUNTER — Other Ambulatory Visit: Payer: Self-pay

## 2021-03-23 ENCOUNTER — Encounter: Payer: Self-pay | Admitting: Gastroenterology

## 2021-03-23 VITALS — BP 102/78 | HR 71 | Temp 97.1°F | Resp 22 | Ht 62.0 in | Wt 196.0 lb

## 2021-03-23 DIAGNOSIS — D122 Benign neoplasm of ascending colon: Secondary | ICD-10-CM

## 2021-03-23 DIAGNOSIS — Z1211 Encounter for screening for malignant neoplasm of colon: Secondary | ICD-10-CM | POA: Diagnosis not present

## 2021-03-23 MED ORDER — SODIUM CHLORIDE 0.9 % IV SOLN: 500.0000 mL | Freq: Once | INTRAVENOUS | Status: DC

## 2021-03-23 NOTE — Progress Notes (Signed)
PT taken to PACU. Monitors in place. VSS. Report given to RN. 

## 2021-03-23 NOTE — Progress Notes (Signed)
VS by CW  Pt's states no medical or surgical changes since previsit or office visit.  

## 2021-03-23 NOTE — Patient Instructions (Signed)
YOU HAD AN ENDOSCOPIC PROCEDURE TODAY AT THE [facility]:   Refer to the procedure report that was given to you for any specific questions about what was found during the examination.  If the procedure report does not answer your questions, please call your gastroenterologist to clarify.  If you requested that your care partner not be given the details of your procedure findings, then the procedure report has been included in a sealed envelope for you to review at your convenience later. ° °**Handouts given on polyps and diverticulosis** ° °YOU SHOULD EXPECT: Some feelings of bloating in the abdomen. Passage of more gas than usual.  Walking can help get rid of the air that was put into your GI tract during the procedure and reduce the bloating. If you had a lower endoscopy (such as a colonoscopy or flexible sigmoidoscopy) you may notice spotting of blood in your stool or on the toilet paper. If you underwent a bowel prep for your procedure, you may not have a normal bowel movement for a few days. ° °Please Note:  You might notice some irritation and congestion in your nose or some drainage.  This is from the oxygen used during your procedure.  There is no need for concern and it should clear up in a day or so. ° °SYMPTOMS TO REPORT IMMEDIATELY: ° °Following lower endoscopy (colonoscopy or flexible sigmoidoscopy): ° Excessive amounts of blood in the stool ° Significant tenderness or worsening of abdominal pains ° Swelling of the abdomen that is new, acute ° Fever of 100°F or higher ° °For urgent or emergent issues, a gastroenterologist can be reached at any hour by calling [phone]. °Do not use MyChart messaging for urgent concerns.  ° ° °DIET:  We do recommend a small meal at first, but then you may proceed to your regular diet.  Drink plenty of fluids but you should avoid alcoholic beverages for 24 hours. ° °ACTIVITY:  You should plan to take it easy for the rest of today and you should NOT DRIVE  or use heavy machinery until tomorrow (because of the sedation medicines used during the test).   ° °FOLLOW UP: °Our staff will call the number listed on your records 48-72 hours following your procedure to check on you and address any questions or concerns that you may have regarding the information given to you following your procedure. If we do not reach you, we will leave a message.  We will attempt to reach you two times.  During this call, we will ask if you have developed any symptoms of COVID 19. If you develop any symptoms (ie: fever, flu-like symptoms, shortness of breath, cough etc.) before then, please call [phone].  If you test positive for Covid 19 in the 2 weeks post procedure, please call and report this information to us.   ° °If any biopsies were taken you will be contacted by phone or by letter within the next 1-3 weeks.  Please call us at [phone] if you have not heard about the biopsies in 3 weeks.  ° ° °SIGNATURES/CONFIDENTIALITY: °You and/or your care partner have signed paperwork which will be entered into your electronic medical record.  These signatures attest to the fact that that the information above on your After Visit Summary has been reviewed and is understood.  Full responsibility of the confidentiality of this discharge information lies with you and/or your care-partner.  °

## 2021-03-23 NOTE — Progress Notes (Signed)
HPI: This is a woman at routine risk for colon cancer   ROS: complete GI ROS as described in HPI, all other review negative.  Constitutional:  No unintentional weight loss   Past Medical History:  Diagnosis Date   Anxiety    on meds   Arthritis    fingers   Cataract    bilateral sx   Chest pain    Constipation    Depression    on meds   Diabetes mellitus without complication ([place])    pre-diabetic   Diverticulosis    mild on colonoscopy 2012   Dyspnea on exertion    Hay fever    History of chicken pox    History of smoking    quit 11/2010   HLD (hyperlipidemia)    Lactose intolerance    Leg edema    Obesity    Rheumatoid arthritis ([place])    Swallowing difficulty     Past Surgical History:  Procedure Laterality Date   abd [place]  2006   nothing acute, no gallbladder stones   BUNIONECTOMY Right    CATARACT EXTRACTION, BILATERAL     COLONOSCOPY  2012   DJ-F/V-movi (good)WNL, mild diverticulosis, rpt 10 yrs   DENTAL SURGERY     all teeth removed- dentures in place   LIPOMA EXCISION  2017   RIGHT forearm   LIPOMA EXCISION Right 10/02/2017   Procedure: EXCISION OF SUBCUTANEOUS LIPOMA OF RIGHT UPPER LATERAL ARM;  Surgeon: Donnie Mesa, MD;  Location: [facility];  Service: General;  Laterality: Right;   TUBAL LIGATION     VAGINAL HYSTERECTOMY  1990   WISDOM TOOTH EXTRACTION      Current Outpatient Medications  Medication Sig Dispense Refill   LORazepam (ATIVAN) 1 MG tablet Take 0.5-1 mg by mouth at bedtime.     metFORMIN (GLUCOPHAGE-XR) 500 MG 24 hr tablet Take 500 mg by mouth daily.     polyethylene glycol powder (GLYCOLAX/MIRALAX) 17 GM/SCOOP powder Take 17 g by mouth daily. 255 g 0   PROAIR HFA 108 (90 Base) MCG/ACT inhaler Inhale 2 puffs into the lungs daily at 6 (six) AM.     acetaminophen (TYLENOL) 500 MG tablet Take 1,000 mg by mouth every 6 (six) hours as needed for mild pain. (Patient not taking: Reported on 03/23/2021)     benzonatate  (TESSALON) 100 MG capsule Take 1 capsule by mouth daily as needed.     meclizine (ANTIVERT) 25 MG tablet Take 25 mg by mouth 3 (three) times daily as needed for dizziness.     meloxicam (MOBIC) 7.5 MG tablet Take 7.5 mg by mouth 2 (two) times daily as needed.     Current Facility-Administered Medications  Medication Dose Route Frequency Provider Last Rate Last Admin   0.9 %  sodium chloride infusion  500 mL Intravenous Once Milus Banister, MD        Allergies as of 03/23/2021 - Review Complete 03/23/2021  Allergen Reaction Noted   Codeine Itching 12/09/2017   Prednisone Other (See Comments) 09/24/2017    Family History  Problem Relation Age of Onset   Diabetes Mother    Hypertension Mother    Emphysema Mother    Coronary artery disease Mother 63   Hyperlipidemia Mother    Depression Mother    Anxiety disorder Mother    Alcohol abuse Mother    Obesity Mother    Coronary artery disease Father 58       MI death   Alcohol  abuse Father    Sudden death Father    Colon polyps Brother 58   Diabetes Brother    Colon polyps Brother 39   Diabetes Maternal Aunt    Colon cancer Maternal Aunt    Colon cancer Maternal Uncle 70   Diabetes Maternal Uncle    Stroke Maternal Grandmother    Esophageal cancer Neg Hx    Rectal cancer Neg Hx    Stomach cancer Neg Hx     Social History   Socioeconomic History   Marital status: Single    Spouse name: Not on file   Number of children: 0   Years of education: 12th   Highest education level: Not on file  Occupational History   Occupation: Retired    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   Social Determinants of Health   Financial Resource Strain: Not on file  Food Insecurity: Not on file  Transportation Needs: Not on file  Physical Activity: Not on file  Stress: Not on file  Social Connections: Not on file  Intimate Partner Violence: Not on file     Physical Exam: BP (!) 131/50    Pulse 83    Temp (!) 97.1 F (36.2 C)    Ht 5\' 2"  (1.575 m)    Wt 196 lb (88.9 kg)    SpO2 96%    BMI 35.85 kg/m  Constitutional: generally well-appearing Psychiatric: alert and oriented x3 Lungs: CTA bilaterally Heart: no MCR  Assessment and plan: 71 y.o. female with routine risk for colon cancer  Screening colonoscopy today  Care is appropriate for the ambulatory setting.  Owens Loffler, MD [facility] 03/23/2021, 7:46 AM

## 2021-03-23 NOTE — Progress Notes (Signed)
Called to room to assist during endoscopic procedure.  Patient ID and intended procedure confirmed with present staff. Received instructions for my participation in the procedure from the performing physician.  

## 2021-03-23 NOTE — Op Note (Addendum)
[place] Patient Name: Wanda Burns Procedure Date: 03/23/2021 7:58 AM MRN: [id number] Endoscopist: Milus Banister , MD Age: 71 Referring MD:  Date of Birth: 02-09-1951 Gender: Female Account #: [id number] Procedure:                Colonoscopy Indications:              Screening for colorectal malignant neoplasm Medicines:                Monitored Anesthesia Care Procedure:                Pre-Anesthesia Assessment:                           - Prior to the procedure, a History and Physical                            was performed, and patient medications and                            allergies were reviewed. The patient's tolerance of                            previous anesthesia was also reviewed. The risks                            and benefits of the procedure and the sedation                            options and risks were discussed with the patient.                            All questions were answered, and informed consent                            was obtained. Prior Anticoagulants: The patient has                            taken no previous anticoagulant or antiplatelet                            agents. ASA Grade Assessment: II - A patient with                            mild systemic disease. After reviewing the risks                            and benefits, the patient was deemed in                            satisfactory condition to undergo the procedure.                           After obtaining informed consent, the colonoscope  was passed under direct vision. Throughout the                            procedure, the patient's blood pressure, pulse, and                            oxygen saturations were monitored continuously. The                            Olympus CF-[id number] ([id number]) Colonoscope was                            introduced through the anus and advanced to the the                            cecum, identified  by appendiceal orifice and                            ileocecal valve. The colonoscopy was performed                            without difficulty. The patient tolerated the                            procedure well. The quality of the bowel                            preparation was good. The ileocecal valve,                            appendiceal orifice, and rectum were photographed. Scope In: 8:06:40 AM Scope Out: 8:19:40 AM Scope Withdrawal Time: 0 hours 10 minutes 38 seconds  Total Procedure Duration: 0 hours 13 minutes 0 seconds  Findings:                 A 4 mm polyp was found in the ascending colon. The                            polyp was sessile. The polyp was removed with a                            cold snare. Resection and retrieval were complete.                           Multiple small and large-mouthed diverticula were                            found in the entire colon.                           The exam was otherwise without abnormality on                            direct and retroflexion views. Complications:  No immediate complications. Estimated blood loss:                            None. Estimated Blood Loss:     Estimated blood loss: none. Impression:               - One 4 mm polyp in the ascending colon, removed                            with a cold snare. Resected and retrieved.                           - Diverticulosis in the entire examined colon.                           - The examination was otherwise normal on direct                            and retroflexion views. Recommendation:           - Patient has a contact number available for                            emergencies. The signs and symptoms of potential                            delayed complications were discussed with the                            patient. Return to normal activities tomorrow.                            Written discharge instructions were provided to the                             patient.                           - Resume previous diet.                           - Continue present medications.                           - Await pathology results. Milus Banister, MD 03/23/2021 8:21:47 AM This report has been signed electronically.

## 2021-03-27 ENCOUNTER — Telehealth: Payer: Self-pay

## 2021-03-27 NOTE — Telephone Encounter (Signed)
°  Follow up Call-  Call back number 03/23/2021  Post procedure Call Back phone  # [phone]  Permission to leave phone message Yes  Some recent data might be hidden     Patient questions:  Do you have a fever, pain , or abdominal swelling? No. Pain Score  0 *  Have you tolerated food without any problems? Yes.    Have you been able to return to your normal activities? Yes.    Do you have any questions about your discharge instructions: Diet   No. Medications  No. Follow up visit  No.  Do you have questions or concerns about your Care? No.  Actions: * If pain score is 4 or above: No action needed, pain <4.  Have you developed a fever since your procedure? no  2.   Have you had an respiratory symptoms (SOB or cough) since your procedure? no  3.   Have you tested positive for COVID 19 since your procedure no  4.   Have you had any family members/close contacts diagnosed with the COVID 19 since your procedure?  no   If yes to any of these questions please route to Joylene John, RN and Joella Prince, RN

## 2021-03-28 ENCOUNTER — Encounter: Payer: Self-pay | Admitting: Gastroenterology

## 2021-05-09 DIAGNOSIS — Z0289 Encounter for other administrative examinations: Secondary | ICD-10-CM

## 2021-05-16 ENCOUNTER — Encounter (INDEPENDENT_AMBULATORY_CARE_PROVIDER_SITE_OTHER): Payer: Self-pay | Admitting: Bariatrics

## 2021-05-16 ENCOUNTER — Ambulatory Visit (INDEPENDENT_AMBULATORY_CARE_PROVIDER_SITE_OTHER): Payer: PPO | Admitting: Bariatrics

## 2021-05-16 ENCOUNTER — Other Ambulatory Visit: Payer: Self-pay

## 2021-05-16 VITALS — BP 118/83 | HR 71 | Temp 97.9°F | Ht 64.0 in | Wt 190.0 lb

## 2021-05-16 DIAGNOSIS — R7303 Prediabetes: Secondary | ICD-10-CM

## 2021-05-16 DIAGNOSIS — R0602 Shortness of breath: Secondary | ICD-10-CM

## 2021-05-16 DIAGNOSIS — E559 Vitamin D deficiency, unspecified: Secondary | ICD-10-CM

## 2021-05-16 DIAGNOSIS — E538 Deficiency of other specified B group vitamins: Secondary | ICD-10-CM

## 2021-05-16 DIAGNOSIS — R5383 Other fatigue: Secondary | ICD-10-CM | POA: Diagnosis not present

## 2021-05-16 DIAGNOSIS — Z1331 Encounter for screening for depression: Secondary | ICD-10-CM

## 2021-05-16 DIAGNOSIS — E668 Other obesity: Secondary | ICD-10-CM

## 2021-05-16 DIAGNOSIS — Z6832 Body mass index (BMI) 32.0-32.9, adult: Secondary | ICD-10-CM

## 2021-05-16 DIAGNOSIS — E6609 Other obesity due to excess calories: Secondary | ICD-10-CM

## 2021-05-16 DIAGNOSIS — E7849 Other hyperlipidemia: Secondary | ICD-10-CM

## 2021-05-16 NOTE — Progress Notes (Signed)
Chief Complaint:   OBESITY Wanda Burns (MR# [id number]) is a 71 y.o. female who presents for evaluation and treatment of obesity and related comorbidities. Current BMI is Body mass index is 32.61 kg/m. Wanda Burns has been struggling with her weight for many years and has been unsuccessful in either losing weight, maintaining weight loss, or reaching her healthy weight goal.  Wanda Burns is a returning patient. Her last visit was 06/22/2018 with Jake Bathe, [place]. She likes to cook. She skips meals at times.   Wanda Burns is currently in the action stage of change and ready to dedicate time achieving and maintaining a healthier weight. Wanda Burns is interested in becoming our patient and working on intensive lifestyle modifications including (but not limited to) diet and exercise for weight loss.  Wanda Burns's habits were reviewed today and are as follows: her desired weight loss is 35 pounds, she started gaining weight in 2004, her heaviest weight ever was 196 pounds, she snacks frequently in the evenings, she skips meals frequently, she has problems with excessive hunger, she frequently eats larger portions than normal, and she struggles with emotional eating.  Depression Screen Wanda Burns's Food and Mood (modified PHQ-9) score was 12.  Depression screen PHQ 2/9 05/16/2021  Decreased Interest 1  Down, Depressed, Hopeless 3  PHQ - 2 Score 4  Altered sleeping 1  Tired, decreased energy 3  Change in appetite 1  Feeling bad or failure about yourself  2  Trouble concentrating 1  Moving slowly or fidgety/restless 0  Suicidal thoughts 0  PHQ-9 Score 12  Difficult doing work/chores Somewhat difficult   Subjective:   1. Other fatigue Wanda Burns will continue activities. RMR was 1224 on 12/2017. Today it was 1411.Wanda Burns reports daytime somnolence and reports waking up still tired. Patient has a history of symptoms of morning fatigue. Wanda Burns generally gets 8 or 9 hours of sleep per night, and states that she has difficulty  falling asleep and generally restful sleep. Snoring is present. Apneic episodes are not present. Epworth Sleepiness Score is 5.    2. SOB (shortness of breath) on exertion Wanda Burns will continue activities. RMR was 1224 on 12/2017. Today it was 1411.Wanda Burns notes increasing shortness of breath with exercising and seems to be worsening over time with weight gain. She notes getting out of breath sooner with activity than she used to. This has not gotten worse recently. Wanda Burns denies shortness of breath at rest or orthopnea.   3. Pre-diabetes Wanda Burns is taking Metformin currently. Her last A1C was 5.8.  4. Vitamin D deficiency Wanda Burns is currently taking Vitamin D.   5. Other hyperlipidemia Wanda Burns wants to work on plan and exercise activities.   6. Vitamin B 12 deficiency Wanda Burns is not taking B 12 currently.   Assessment/Plan:   1. Other fatigue Wanda Burns will continue activities. She will gradually increase activities. We will check thyroid panel today. Wanda Burns does feel that her weight is causing her energy to be lower than it should be. Fatigue may be related to obesity, depression or many other causes. Labs will be ordered, and in the meanwhile, Wanda Burns will focus on self care including making healthy food choices, increasing physical activity and focusing on stress reduction.  - TSH+T4F+T3Free  2. SOB (shortness of breath) on exertion [name] will continue activities. She will gradually increase activities. We will check thyroid panel today. Wanda Burns does feel that she gets out of breath more easily that she used to when she exercises. Wanda Burns's shortness of breath appears to be  obesity related and exercise induced. She has agreed to work on weight loss and gradually increase exercise to treat her exercise induced shortness of breath. Will continue to monitor closely.  - TSH+T4F+T3Free  3. Pre-diabetes Wanda Burns will continue taking Metformin. We will check A1C and insulin. She will continue to work on weight loss,  exercise, and decreasing simple carbohydrates to help decrease the risk of diabetes.   - Insulin, random -A1c  4. Vitamin D deficiency Low Vitamin D level contributes to fatigue and are associated with obesity, breast, and colon cancer. Wanda Burns agrees to continue to take prescription Vitamin D and she will follow-up for routine testing of Vitamin D, at least 2-3 times per year to avoid over-replacement.  5. Other hyperlipidemia Cardiovascular risk and specific lipid/LDL goals reviewed.  Wanda Burns will have zero trans fats. She will increase MUFAs and PUFAs. We discussed several lifestyle modifications today and Wanda Burns will continue to work on diet, exercise and weight loss efforts. Orders and follow up as documented in patient record.   Counseling Intensive lifestyle modifications are the first line treatment for this issue. Dietary changes: Increase soluble fiber. Decrease simple carbohydrates. Exercise changes: Moderate to vigorous-intensity aerobic activity 150 minutes per week if tolerated. Lipid-lowering medications: see documented in medical record.  6. Vitamin B 12 deficiency The diagnosis was reviewed with the patient. Counseling provided today, see below. We will continue to monitor. We will check Vitamin B 12 today. Orders and follow up as documented in patient record.  Counseling The body needs vitamin B12: to make red blood cells; to make DNA; and to help the nerves work properly so they can carry messages from the brain to the body.  The main causes of vitamin B12 deficiency include dietary deficiency, digestive diseases, pernicious anemia, and having a surgery in which part of the stomach or small intestine is removed.  Certain medicines can make it harder for the body to absorb vitamin B12. These medicines include: heartburn medications; some antibiotics; some medications used to treat diabetes, gout, and high cholesterol.  In some cases, there are no symptoms of this condition. If  the condition leads to anemia or nerve damage, various symptoms can occur, such as weakness or fatigue, shortness of breath, and numbness or tingling in your hands and feet.   Treatment:  May include taking vitamin B12 supplements.  Avoid alcohol.  Eat lots of healthy foods that contain vitamin B12: Beef, pork, chicken, [place], and organ meats, such as liver.  Seafood: This includes clams, rainbow trout, salmon, tuna, and haddock. Eggs.  Cereal and dairy products that are fortified: This means that vitamin B12 has been added to the food.   - Vitamin B12  7. Depression screen Wanda Burns had a positive depression screening. Depression is commonly associated with obesity and often results in emotional eating behaviors. We will monitor this closely and work on CBT to help improve the non-hunger eating patterns. Referral to Psychology may be required if no improvement is seen as she continues in our clinic.   8. Class 1 obesity due to excess calories with serious comorbidity and body mass index (BMI) of 32.0 to 32.9 in adult Wanda Burns is currently in the action stage of change and her goal is to continue with weight loss efforts. I recommend Wanda Burns begin the structured treatment plan as follows:  She has agreed to the Category 1 Plan and keeping a food journal and adhering to recommended goals of 1000-1100 calories and 70-80 grams of protein.  Wanda Burns will  continue meal planning and she will continue intentional eating. We reviewed labs from 05/08/2021 CBC, CMP, A1C, Lipids and Vitamin D.   Exercise goals:  Wanda Burns will start walking 1 mile daily.    Behavioral modification strategies: increasing lean protein intake, decreasing simple carbohydrates, increasing vegetables, increasing water intake, decreasing eating out, no skipping meals, meal planning and cooking strategies, keeping healthy foods in the home, and planning for success.  She was informed of the importance of frequent follow-up visits to maximize  her success with intensive lifestyle modifications for her multiple health conditions. She was informed we would discuss her lab results at her next visit unless there is a critical issue that needs to be addressed sooner. Wanda Burns agreed to keep her next visit at the agreed upon time to discuss these results.  Objective:   Blood pressure 118/83, pulse 71, temperature 97.9 F (36.6 C), height '5\' 4"'$  (1.626 m), weight 190 lb (86.2 kg), SpO2 98 %. Body mass index is 32.61 kg/m.  EKG: Normal sinus rhythm, rate unable to obtain.  Indirect Calorimeter completed today shows a VO2 of 205 and a REE of 1411.  Her calculated basal metabolic rate is 4081 thus her basal metabolic rate is worse than expected.  General: Cooperative, alert, well developed, in no acute distress. HEENT: Conjunctivae and lids unremarkable. Cardiovascular: Regular rhythm.  Lungs: Normal work of breathing. Neurologic: No focal deficits.   Lab Results  Component Value Date   CREATININE 1.14 (H) 10/06/2019   BUN 21 10/06/2019   NA 140 10/06/2019   K 4.3 10/06/2019   CL 108 10/06/2019   CO2 22 10/06/2019   Lab Results  Component Value Date   ALT 16 03/17/2018   AST 13 03/17/2018   ALKPHOS 148 (H) 03/17/2018   BILITOT <0.2 03/17/2018   Lab Results  Component Value Date   HGBA1C 5.9 (H) 03/17/2018   HGBA1C 6.2 (H) 12/09/2017   Lab Results  Component Value Date   INSULIN 13.4 03/17/2018   INSULIN 14.7 12/09/2017   Lab Results  Component Value Date   TSH 2.500 12/09/2017   Lab Results  Component Value Date   CHOL 253 (H) 03/17/2018   HDL 64 03/17/2018   LDLCALC 162 (H) 03/17/2018   LDLDIRECT 146.2 09/04/2010   TRIG 137 03/17/2018   CHOLHDL 6 09/04/2010   Lab Results  Component Value Date   WBC 11.7 (H) 10/06/2019   HGB 11.9 (L) 10/06/2019   HCT 38.1 10/06/2019   MCV 86.4 10/06/2019   PLT 339 10/06/2019   No results found for: IRON, TIBC, FERRITIN  Attestation Statements:   Reviewed by clinician  on day of visit: allergies, medications, problem list, medical history, surgical history, family history, social history, and previous encounter notes.  I, Lizbeth Bark, RMA, am acting as Location manager for [employer], DO.  I have reviewed the above documentation for accuracy and completeness, and I agree with the above. Jearld Lesch, DO

## 2021-05-17 LAB — TSH+T4F+T3FREE
Free T4: 1.22 ng/dL (ref 0.82–1.77)
T3, Free: 2.7 pg/mL (ref 2.0–4.4)
TSH: 2.62 u[IU]/mL (ref 0.450–4.500)

## 2021-05-17 LAB — INSULIN, RANDOM: INSULIN: 10.7 u[IU]/mL (ref 2.6–24.9)

## 2021-05-17 LAB — VITAMIN B12: Vitamin B-12: 370 pg/mL (ref 232–1245)

## 2021-05-30 ENCOUNTER — Other Ambulatory Visit: Payer: Self-pay

## 2021-05-30 ENCOUNTER — Encounter (INDEPENDENT_AMBULATORY_CARE_PROVIDER_SITE_OTHER): Payer: Self-pay | Admitting: Bariatrics

## 2021-05-30 ENCOUNTER — Ambulatory Visit (INDEPENDENT_AMBULATORY_CARE_PROVIDER_SITE_OTHER): Payer: PPO | Admitting: Bariatrics

## 2021-05-30 VITALS — BP 124/84 | HR 75 | Temp 97.9°F | Ht 64.0 in | Wt 191.0 lb

## 2021-05-30 DIAGNOSIS — Z6832 Body mass index (BMI) 32.0-32.9, adult: Secondary | ICD-10-CM | POA: Diagnosis not present

## 2021-05-30 DIAGNOSIS — E6609 Other obesity due to excess calories: Secondary | ICD-10-CM

## 2021-05-30 DIAGNOSIS — R7303 Prediabetes: Secondary | ICD-10-CM | POA: Diagnosis not present

## 2021-05-30 DIAGNOSIS — E669 Obesity, unspecified: Secondary | ICD-10-CM

## 2021-05-30 DIAGNOSIS — E559 Vitamin D deficiency, unspecified: Secondary | ICD-10-CM

## 2021-06-01 NOTE — Progress Notes (Signed)
? ? ? ?Chief Complaint:  ? ?OBESITY ?Wanda Burns is here to discuss her progress with her obesity treatment plan along with follow-up of her obesity related diagnoses. Wanda Burns is on the Category 1 Plan and states she is following her eating plan approximately 95% of the time. Wanda Burns states she is walking for 30 minutes 3-5 times per week. ? ?Today's visit was #: 2 ?Starting weight: 190 lbs ?Starting date: 05/16/2021 ?Today's weight: 191 lbs ?Today's date: 05/30/2021 ?Total lbs lost to date: 0 ?Total lbs lost since last in-office visit: 0 ? ?Interim History: Labrittany's weight is up 1 lb since her first visit. She states that she was more adherent to ithe plan than last time.  ? ?Subjective:  ? ?1. Pre-diabetes ?Wanda Burns is currently taking Metformin XR. Her insulin was 10.7. ? ?2. Vitamin D deficiency ?Wanda Burns's Vitamin D  is controlled.  ? ?Assessment/Plan:  ? ?1. Pre-diabetes ?Wanda Burns will continue Metformin. Insulin and pre-diabetes handouts was provided today. She will continue to work on weight loss, exercise, and decreasing simple carbohydrates to help decrease the risk of diabetes.  ? ?2. Vitamin D deficiency ?Low Vitamin D level contributes to fatigue and are associated with obesity, breast, and colon cancer. Shakirra will continue over the counter Vitamin D 50,000 IU every week and will follow-up for routine testing of Vitamin D, at least 2-3 times per year to avoid over-replacement. ? ?3. Obesity, current BMI 32.8 ?Wanda Burns is currently in the action stage of change. As such, her goal is to continue with weight loss efforts. She has agreed to the Category 1 Plan and keeping a food journal and adhering to recommended goals of 1000-1100 calories and 60 grams of protein.  ? ?Wanda Burns will continue meal planning and she will adhere closely to the plan. We reviewed labs from 05/16/2021 B 12, insulin and thyroid panel.  ? ?Exercise goals:  As is. ? ?Behavioral modification strategies: increasing lean protein intake, decreasing simple  carbohydrates, increasing vegetables, increasing water intake, decreasing eating out, no skipping meals, meal planning and cooking strategies, keeping healthy foods in the home, and planning for success. ? ?Wanda Burns has agreed to follow-up with our clinic in 2 weeks with Wanda Pacific, FNP or Wanda Marble, NP and 4 weeks with myself. She was informed of the importance of frequent follow-up visits to maximize her success with intensive lifestyle modifications for her multiple health conditions.  ? ?Objective:  ? ?Blood pressure 124/84, pulse 75, temperature 97.9 ?F (36.6 ?C), height '5\' 4"'$  (1.626 m), weight 191 lb (86.6 kg), SpO2 95 %. ?Body mass index is 32.79 kg/m?. ? ?General: Cooperative, alert, well developed, in no acute distress. ?HEENT: Conjunctivae and lids unremarkable. ?Cardiovascular: Regular rhythm.  ?Lungs: Normal work of breathing. ?Neurologic: No focal deficits.  ? ?Lab Results  ?Component Value Date  ? CREATININE 1.14 (H) 10/06/2019  ? BUN 21 10/06/2019  ? NA 140 10/06/2019  ? K 4.3 10/06/2019  ? CL 108 10/06/2019  ? CO2 22 10/06/2019  ? ?Lab Results  ?Component Value Date  ? ALT 16 03/17/2018  ? AST 13 03/17/2018  ? ALKPHOS 148 (H) 03/17/2018  ? BILITOT <0.2 03/17/2018  ? ?Lab Results  ?Component Value Date  ? HGBA1C 5.9 (H) 03/17/2018  ? HGBA1C 6.2 (H) 12/09/2017  ? ?Lab Results  ?Component Value Date  ? INSULIN 10.7 05/16/2021  ? INSULIN 13.4 03/17/2018  ? INSULIN 14.7 12/09/2017  ? ?Lab Results  ?Component Value Date  ? TSH 2.620 05/16/2021  ? ?Lab Results  ?  Component Value Date  ? CHOL 253 (H) 03/17/2018  ? HDL 64 03/17/2018  ? LDLCALC 162 (H) 03/17/2018  ? LDLDIRECT 146.2 09/04/2010  ? TRIG 137 03/17/2018  ? CHOLHDL 6 09/04/2010  ? ?Lab Results  ?Component Value Date  ? VD25OH 52.2 03/17/2018  ? VD25OH 37.6 12/09/2017  ? ?Lab Results  ?Component Value Date  ? WBC 11.7 (H) 10/06/2019  ? HGB 11.9 (L) 10/06/2019  ? HCT 38.1 10/06/2019  ? MCV 86.4 10/06/2019  ? PLT 339 10/06/2019  ? ?No results  found for: IRON, TIBC, FERRITIN ? ?Attestation Statements:  ? ?Reviewed by clinician on day of visit: allergies, medications, problem list, medical history, surgical history, family history, social history, and previous encounter notes. ? ?I, Wanda Burns, RMA, am acting as transcriptionist for [employer], DO. ? ?I have reviewed the above documentation for accuracy and completeness, and I agree with the above. Wanda Lesch, DO ? ?

## 2021-06-04 ENCOUNTER — Encounter (INDEPENDENT_AMBULATORY_CARE_PROVIDER_SITE_OTHER): Payer: Self-pay | Admitting: Bariatrics

## 2021-06-14 ENCOUNTER — Ambulatory Visit (INDEPENDENT_AMBULATORY_CARE_PROVIDER_SITE_OTHER): Payer: PPO | Admitting: Nurse Practitioner

## 2021-06-14 ENCOUNTER — Encounter (INDEPENDENT_AMBULATORY_CARE_PROVIDER_SITE_OTHER): Payer: Self-pay | Admitting: Nurse Practitioner

## 2021-06-14 VITALS — BP 108/75 | HR 74 | Temp 98.0°F | Ht 64.0 in | Wt 189.0 lb

## 2021-06-14 DIAGNOSIS — E669 Obesity, unspecified: Secondary | ICD-10-CM

## 2021-06-14 DIAGNOSIS — R7303 Prediabetes: Secondary | ICD-10-CM

## 2021-06-14 DIAGNOSIS — Z6832 Body mass index (BMI) 32.0-32.9, adult: Secondary | ICD-10-CM

## 2021-06-14 DIAGNOSIS — E6609 Other obesity due to excess calories: Secondary | ICD-10-CM

## 2021-06-15 NOTE — Progress Notes (Signed)
? ? ? ?Chief Complaint:  ? ?OBESITY ?Wanda Burns is here to discuss her progress with her obesity treatment plan along with follow-up of her obesity related diagnoses. Wanda Burns is on the Category 1 Plan+ 1000-1100 calories, 60 grams of protein and states she is following her eating plan approximately 95% of the time. Wanda Burns states she is walking, yard work 30-60 minutes 3-5 times per week. ? ?Today's visit was #: 3 ?Starting weight: 190 lbs ?Starting date: 05/16/2021 ?Today's weight: 189 lbs ?Today's date: 06/14/2021 ?Total lbs lost to date: 1 ?Total lbs lost since last in-office visit: 2 ? ?Interim History: Wanda Burns is following the plan well. She is craving salty snacks, denies hunger. She struggles with meeting water goals.  She has increased water intake over the past 2 weeks. ? ?Subjective:  ? ?1. Pre-diabetes ?Wanda Burns's last A1c was 5.8. She is currently taking Metformin XR 500 mg. Denies any side effects. ? ?Assessment/Plan:  ? ?1. Pre-diabetes ?Wanda Burns will continue to follow up with PCP. She will continue medication as as directed. ? ?2. Obesity, current BMI 32.6 ?Wanda Burns is currently in the action stage of change. As such, her goal is to continue with weight loss efforts. She has agreed to the Category 1 Plan. Handout given: additional lunch options. ? ?Exercise goals: Silver Sneakers. ? ?Behavioral modification strategies: increasing lean protein intake, increasing water intake, and no skipping meals. ? ?Wanda Burns has agreed to follow-up with our clinic in 3 weeks. She was informed of the importance of frequent follow-up visits to maximize her success with intensive lifestyle modifications for her multiple health conditions.  ? ?Objective:  ? ?Blood pressure 108/75, pulse 74, temperature 98 ?F (36.7 ?C), height '5\' 4"'$  (1.626 m), weight 189 lb (85.7 kg), SpO2 100 %. ?Body mass index is 32.44 kg/m?. ? ?General: Cooperative, alert, well developed, in no acute distress. ?HEENT: Conjunctivae and lids unremarkable. ?Cardiovascular:  Regular rhythm.  ?Lungs: Normal work of breathing. ?Neurologic: No focal deficits.  ? ?Lab Results  ?Component Value Date  ? CREATININE 1.14 (H) 10/06/2019  ? BUN 21 10/06/2019  ? NA 140 10/06/2019  ? K 4.3 10/06/2019  ? CL 108 10/06/2019  ? CO2 22 10/06/2019  ? ?Lab Results  ?Component Value Date  ? ALT 16 03/17/2018  ? AST 13 03/17/2018  ? ALKPHOS 148 (H) 03/17/2018  ? BILITOT <0.2 03/17/2018  ? ?Lab Results  ?Component Value Date  ? HGBA1C 5.9 (H) 03/17/2018  ? HGBA1C 6.2 (H) 12/09/2017  ? ?Lab Results  ?Component Value Date  ? INSULIN 10.7 05/16/2021  ? INSULIN 13.4 03/17/2018  ? INSULIN 14.7 12/09/2017  ? ?Lab Results  ?Component Value Date  ? TSH 2.620 05/16/2021  ? ?Lab Results  ?Component Value Date  ? CHOL 253 (H) 03/17/2018  ? HDL 64 03/17/2018  ? LDLCALC 162 (H) 03/17/2018  ? LDLDIRECT 146.2 09/04/2010  ? TRIG 137 03/17/2018  ? CHOLHDL 6 09/04/2010  ? ?Lab Results  ?Component Value Date  ? VD25OH 52.2 03/17/2018  ? VD25OH 37.6 12/09/2017  ? ?Lab Results  ?Component Value Date  ? WBC 11.7 (H) 10/06/2019  ? HGB 11.9 (L) 10/06/2019  ? HCT 38.1 10/06/2019  ? MCV 86.4 10/06/2019  ? PLT 339 10/06/2019  ? ?No results found for: IRON, TIBC, FERRITIN ? ?Obesity Behavioral Intervention:  ? ?Approximately 15 minutes were spent on the discussion below. ? ?ASK: ?We discussed the diagnosis of obesity with Maleeya today and Taje agreed to give [place] permission to discuss obesity behavioral modification therapy  today. ? ?ASSESS: ?Wanda Burns has the diagnosis of obesity and her BMI today is 32.6. Wanda Burns is in the action stage of change.  ? ?ADVISE: ?Wanda Burns was educated on the multiple health risks of obesity as well as the benefit of weight loss to improve her health. She was advised of the need for long term treatment and the importance of lifestyle modifications to improve her current health and to decrease her risk of future health problems. ? ?AGREE: ?Multiple dietary modification options and treatment options were discussed and  Wanda Burns agreed to follow the recommendations documented in the above note. ? ?ARRANGE: ?Wanda Burns was educated on the importance of frequent visits to treat obesity as outlined per CMS and USPSTF guidelines and agreed to schedule her next follow up appointment today. ? ?Attestation Statements:  ? ?Reviewed by clinician on day of visit: allergies, medications, problem list, medical history, surgical history, family history, social history, and previous encounter notes. ?  ?I, Brendell Tyus, am acting as transcriptionist for Everardo Pacific, FNP.. ? ?Spent 30 minutes with the patient today on above.  ? ?I have reviewed the above documentation for accuracy and completeness, and I agree with the above. Everardo Pacific, FNP  ?

## 2021-06-18 ENCOUNTER — Encounter (INDEPENDENT_AMBULATORY_CARE_PROVIDER_SITE_OTHER): Payer: Self-pay | Admitting: Nurse Practitioner

## 2021-07-02 ENCOUNTER — Ambulatory Visit (INDEPENDENT_AMBULATORY_CARE_PROVIDER_SITE_OTHER): Payer: PPO | Admitting: Bariatrics

## 2021-07-03 NOTE — Progress Notes (Signed)
?TeleHealth Visit:  ?This visit was completed with telemedicine (audio/video) technology. ?Wanda Burns has verbally consented to this TeleHealth visit. The patient is located at home, the provider is located at home. The participants in this visit include the listed provider and patient. The visit was conducted today via MyChart video. ? ?OBESITY ?Wanda Burns is here to discuss her progress with her obesity treatment plan along with follow-up of her obesity related diagnoses.  ? ?Today's visit was # 4 ?Starting weight: 190 lbs ?Starting date: Restart 05/16/21. Attended clinic Oct 2019-March 2020 (start weight 194 pounds) ?Weight at last in office visit: 189 lbs on 06/14/21 ?Total weight loss: 1 lbs at last in office visit on 06/14/21. ?Today's reported weight: 189.2- home scales weigh heavy  ? ? ?Nutrition Plan: the Category 1 Plan.  ?Hunger is moderately controlled. Cravings are moderately controlled.  ?Current exercise: walking 1 to 1.5 miles 5 days per week in the park. Also does yard work. ? ?Interim History: Wanda Burns has realized that she does well on the plan when she plans her meals ahead of time.  She does admit to skipping meals occasionally.  She journals her protein and calories as well as following category 1. ?She reports that she sometimes craves foods such as casseroles. ? ?Assessment/Plan:  ?1.  Other depression with emotional eating ?Wanda Burns reports depression has been an issue for her.  Her PCP put her on bupropion 75 mg twice daily.  She feels this is beneficial for reducing her appetite.  She reports that her depression is improving.  She takes Ativan at night for sleep. ?She struggles with eating when she is bored. ?She has seen Dr. Mallie Mussel in the past. ? ?Plan: ?Continue bupropion and Ativan as ordered by PCP. ?Discussed reestablishing care with Dr. Mallie Mussel at future visit. ? ?2.  Other constipation ?Wanda Burns notes hard stools since starting our program.  She uses MiraLAX daily but this has not  resolved. ? ?Plan: ?May use MiraLAX twice daily ? ?3. Obesity: Current BMI 32.43 ?Wanda Burns is currently in the action stage of change. As such, her goal is to continue with weight loss efforts.  ? ?Plan: ?Switch to journaling 1000 to 1100 cal with 70 g of protein daily. ? ?Exercise goals: She has agreed to walk 1 to 1.5 miles 5 days per week in the park. Also will do yard work..  ? ?Behavioral modification strategies: increasing lean protein intake, decreasing simple carbohydrates, and increasing water intake. ? ?Wanda Burns has agreed to follow-up with our clinic in 4 weeks.  ? ?No orders of the defined types were placed in this encounter. ? ? ?There are no discontinued medications.  ? ?No orders of the defined types were placed in this encounter. ?   ? ?Objective:  ? ?VITALS: Per patient if applicable, see vitals. ?GENERAL: Alert and in no acute distress. ?CARDIOPULMONARY: No increased WOB. Speaking in clear sentences.  ?PSYCH: Pleasant and cooperative. Speech normal rate and rhythm. Affect is appropriate. Insight and judgement are appropriate. Attention is focused, linear, and appropriate.  ?NEURO: Oriented as arrived to appointment on time with no prompting.  ? ?Lab Results  ?Component Value Date  ? CREATININE 1.14 (H) 10/06/2019  ? BUN 21 10/06/2019  ? NA 140 10/06/2019  ? K 4.3 10/06/2019  ? CL 108 10/06/2019  ? CO2 22 10/06/2019  ? ?Lab Results  ?Component Value Date  ? ALT 16 03/17/2018  ? AST 13 03/17/2018  ? ALKPHOS 148 (H) 03/17/2018  ? BILITOT <0.2 03/17/2018  ? ?Lab  Results  ?Component Value Date  ? HGBA1C 5.9 (H) 03/17/2018  ? HGBA1C 6.2 (H) 12/09/2017  ? ?Lab Results  ?Component Value Date  ? INSULIN 10.7 05/16/2021  ? INSULIN 13.4 03/17/2018  ? INSULIN 14.7 12/09/2017  ? ?Lab Results  ?Component Value Date  ? TSH 2.620 05/16/2021  ? ?Lab Results  ?Component Value Date  ? CHOL 253 (H) 03/17/2018  ? HDL 64 03/17/2018  ? LDLCALC 162 (H) 03/17/2018  ? LDLDIRECT 146.2 09/04/2010  ? TRIG 137 03/17/2018  ? CHOLHDL 6  09/04/2010  ? ?Lab Results  ?Component Value Date  ? WBC 11.7 (H) 10/06/2019  ? HGB 11.9 (L) 10/06/2019  ? HCT 38.1 10/06/2019  ? MCV 86.4 10/06/2019  ? PLT 339 10/06/2019  ? ?No results found for: IRON, TIBC, FERRITIN ?Lab Results  ?Component Value Date  ? VD25OH 52.2 03/17/2018  ? VD25OH 37.6 12/09/2017  ? ? ?Attestation Statements:  ? ?Reviewed by clinician on day of visit: allergies, medications, problem list, medical history, surgical history, family history, social history, and previous encounter notes. ? ?Time spent on visit including pre-visit chart review and post-visit charting and care was 40 minutes.  ? ? ?

## 2021-07-04 ENCOUNTER — Telehealth (INDEPENDENT_AMBULATORY_CARE_PROVIDER_SITE_OTHER): Payer: PPO | Admitting: Family Medicine

## 2021-07-04 ENCOUNTER — Encounter (INDEPENDENT_AMBULATORY_CARE_PROVIDER_SITE_OTHER): Payer: Self-pay | Admitting: Family Medicine

## 2021-07-04 DIAGNOSIS — K5900 Constipation, unspecified: Secondary | ICD-10-CM | POA: Diagnosis not present

## 2021-07-04 DIAGNOSIS — E669 Obesity, unspecified: Secondary | ICD-10-CM | POA: Diagnosis not present

## 2021-07-04 DIAGNOSIS — E6609 Other obesity due to excess calories: Secondary | ICD-10-CM

## 2021-07-04 DIAGNOSIS — Z6832 Body mass index (BMI) 32.0-32.9, adult: Secondary | ICD-10-CM

## 2021-07-04 DIAGNOSIS — F3289 Other specified depressive episodes: Secondary | ICD-10-CM | POA: Diagnosis not present

## 2021-07-30 ENCOUNTER — Encounter (INDEPENDENT_AMBULATORY_CARE_PROVIDER_SITE_OTHER): Payer: Self-pay | Admitting: Nurse Practitioner

## 2021-07-30 ENCOUNTER — Ambulatory Visit (INDEPENDENT_AMBULATORY_CARE_PROVIDER_SITE_OTHER): Payer: PPO | Admitting: Nurse Practitioner

## 2021-07-30 VITALS — BP 120/81 | HR 97 | Temp 97.6°F | Ht 64.0 in | Wt 186.0 lb

## 2021-07-30 DIAGNOSIS — Z6832 Body mass index (BMI) 32.0-32.9, adult: Secondary | ICD-10-CM

## 2021-07-30 DIAGNOSIS — F3289 Other specified depressive episodes: Secondary | ICD-10-CM

## 2021-07-30 DIAGNOSIS — E669 Obesity, unspecified: Secondary | ICD-10-CM | POA: Diagnosis not present

## 2021-07-30 DIAGNOSIS — R7303 Prediabetes: Secondary | ICD-10-CM | POA: Diagnosis not present

## 2021-07-30 DIAGNOSIS — E6609 Other obesity due to excess calories: Secondary | ICD-10-CM

## 2021-07-31 NOTE — Progress Notes (Unsigned)
Chief Complaint:   OBESITY Wanda Burns is here to discuss her progress with her obesity treatment plan along with follow-up of her obesity related diagnoses. Wanda Burns is on the Category 1 Plan and keeping a food journal and adhering to recommended goals of 1000-1100 calories and 70 grams of  protein daily and states she is following her eating plan approximately 90-95% of the time. Wanda Burns states she is is doing 0 minutes 0 times per week.  Today's visit was #: 5 Starting weight: 190 lbs Starting date: 05/16/2021 Today's weight: 186 lbs Today's date: 07/30/2021 Total lbs lost to date: 4 lbs Total lbs lost since last in-office visit: 3 lbs  Interim History: Wanda Burns has done well with weight loss since her last visit. She is using Lose it to track her calories/protein. She has added more fruit since her last visit. She is struggling with hunger and cravings stews and casseroles. She hasn't been exercising due to life stress. Her friend passed away yesterday.   Subjective:   1. Pre-diabetes Wanda Burns is taking Metformin XR 500 mg. She denies side effects. Her last A1C was 5.8.  2. Other depression with emotional eating Wanda Burns is taking Wellbutrin 75 mg 2 times daily prescribed by her primary care physician. She is interested in increasing the dose. She notes occasional side effects of ear ringing.   Assessment/Plan:   1. Pre-diabetes Wanda Burns will continue to follow up with her primary care physician. She will continue medications as directed. She will continue to work on weight loss, exercise, and decreasing simple carbohydrates to help decrease the risk of diabetes.   2. Other depression with emotional eating Encouraged Wanda Burns to reach out to her primary care physician to discuss increasing dose to discuss side effects. Behavior modification techniques were discussed today to help Wanda Burns deal with her emotional/non-hunger eating behaviors.  Orders and follow up as documented in patient record.   3.  Obesity, current BMI 32.0 Wanda Burns is currently in the action stage of change. As such, her goal is to continue with weight loss efforts. She has agreed to following a lower carbohydrate, vegetable and lean protein rich diet plan.   Exercise goals:  As is.  Wanda Burns will start walking.   Behavioral modification strategies: increasing lean protein intake, increasing water intake, no skipping meals, meal planning and cooking strategies, and planning for success.  Wanda Burns has agreed to follow-up with our clinic in 3 weeks. She was informed of the importance of frequent follow-up visits to maximize her success with intensive lifestyle modifications for her multiple health conditions.   Objective:   Blood pressure 120/81, pulse 97, temperature 97.6 F (36.4 C), height '5\' 4"'$  (1.626 m), weight 186 lb (84.4 kg), SpO2 96 %. Body mass index is 31.93 kg/m.  General: Cooperative, alert, well developed, in no acute distress. HEENT: Conjunctivae and lids unremarkable. Cardiovascular: Regular rhythm.  Lungs: Normal work of breathing. Neurologic: No focal deficits.   Lab Results  Component Value Date   CREATININE 1.14 (H) 10/06/2019   BUN 21 10/06/2019   NA 140 10/06/2019   K 4.3 10/06/2019   CL 108 10/06/2019   CO2 22 10/06/2019   Lab Results  Component Value Date   ALT 16 03/17/2018   AST 13 03/17/2018   ALKPHOS 148 (H) 03/17/2018   BILITOT <0.2 03/17/2018   Lab Results  Component Value Date   HGBA1C 5.9 (H) 03/17/2018   HGBA1C 6.2 (H) 12/09/2017   Lab Results  Component Value Date  INSULIN 10.7 05/16/2021   INSULIN 13.4 03/17/2018   INSULIN 14.7 12/09/2017   Lab Results  Component Value Date   TSH 2.620 05/16/2021   Lab Results  Component Value Date   CHOL 253 (H) 03/17/2018   HDL 64 03/17/2018   LDLCALC 162 (H) 03/17/2018   LDLDIRECT 146.2 09/04/2010   TRIG 137 03/17/2018   CHOLHDL 6 09/04/2010   Lab Results  Component Value Date   VD25OH 52.2 03/17/2018   VD25OH 37.6  12/09/2017   Lab Results  Component Value Date   WBC 11.7 (H) 10/06/2019   HGB 11.9 (L) 10/06/2019   HCT 38.1 10/06/2019   MCV 86.4 10/06/2019   PLT 339 10/06/2019   No results found for: IRON, TIBC, FERRITIN  Attestation Statements:   Reviewed by clinician on day of visit: allergies, medications, problem list, medical history, surgical history, family history, social history, and previous encounter notes.  Time spent on visit including pre-visit chart review and post-visit care and charting was 30 minutes.   I, Lizbeth Bark, RMA, am acting as Location manager for Everardo Pacific, FNP.  I have reviewed the above documentation for accuracy and completeness, and I agree with the above. -  ***

## 2021-08-28 ENCOUNTER — Ambulatory Visit (INDEPENDENT_AMBULATORY_CARE_PROVIDER_SITE_OTHER): Payer: PPO | Admitting: Nurse Practitioner

## 2021-08-28 ENCOUNTER — Encounter (INDEPENDENT_AMBULATORY_CARE_PROVIDER_SITE_OTHER): Payer: Self-pay | Admitting: Nurse Practitioner

## 2021-08-28 VITALS — BP 117/78 | HR 71 | Temp 97.9°F | Ht 64.0 in | Wt 180.0 lb

## 2021-08-28 DIAGNOSIS — F3289 Other specified depressive episodes: Secondary | ICD-10-CM

## 2021-08-28 DIAGNOSIS — R7303 Prediabetes: Secondary | ICD-10-CM | POA: Diagnosis not present

## 2021-08-28 DIAGNOSIS — Z6830 Body mass index (BMI) 30.0-30.9, adult: Secondary | ICD-10-CM

## 2021-08-28 DIAGNOSIS — E6609 Other obesity due to excess calories: Secondary | ICD-10-CM

## 2021-08-28 DIAGNOSIS — E669 Obesity, unspecified: Secondary | ICD-10-CM

## 2021-08-29 NOTE — Progress Notes (Signed)
Chief Complaint:   OBESITY Wanda Burns is here to discuss her progress with her obesity treatment plan along with follow-up of her obesity related diagnoses. Wanda Burns is on following a lower carbohydrate, vegetable and lean protein rich diet plan and states she is following her eating plan approximately 90% of the time. Wanda Burns states she is staying active 7 times per week.  Today's visit was #: 6 Starting weight: 190 lbs Starting date: 05/16/2021 Today's weight: 180 lbs Today's date: 08/28/2021 Total lbs lost to date: 10 lbs Total lbs lost since last in-office visit: 6  Interim History: Wanda Burns has done well with weight loss since her last visit. She has been following low carb plan. She misses toast. She has been meal planning more and cooking ahead. Eating more protein and vegetables, denied hunger and cravings. Her mood and activity are better.  She enjoys [employer] fudgesicles. Struggles with meeting calorie goals.  Subjective:   1. Pre-diabetes Wanda Burns is currently taking Metformin XR 500 mg. Denies any side effects. Helps with hunger and cravings.  2. Other depression with emotional eating Wanda Burns is currently taking Wellbutrin 300 mg daily, increase at last visit. She reports her mood is better and helps her stay positive, as she wants to do more.  Assessment/Plan:   1. Pre-diabetes Wanda Burns with continue to follow up with PCP. Take medications as directed.   Wanda Burns will continue to work on weight loss, exercise, and decreasing simple carbohydrates to help decrease the risk of diabetes.    2. Other depression with emotional eating Wanda Burns with continue to follow up with PCP. Take medications as directed.   3. Obesity, current BMI 30.9 Wanda Burns is currently in the action stage of change. As such, her goal is to continue with weight loss efforts. She has agreed to following a lower carbohydrate, vegetable and lean protein rich diet plan.   Wanda Burns has labs scheduled with PCP in  Sept.  Exercise goals: All adults should avoid inactivity. Some physical activity is better than none, and adults who participate in any amount of physical activity gain some health benefits.  Behavioral modification strategies: increasing lean protein intake, increasing water intake, and planning for success.  Wanda Burns has agreed to follow-up with our clinic in 4 weeks. She was informed of the importance of frequent follow-up visits to maximize her success with intensive lifestyle modifications for her multiple health conditions.   Objective:   Blood pressure 117/78, pulse 71, temperature 97.9 F (36.6 C), height '5\' 4"'$  (1.626 m), weight 180 lb (81.6 kg), SpO2 96 %. Body mass index is 30.9 kg/m.  General: Cooperative, alert, well developed, in no acute distress. HEENT: Conjunctivae and lids unremarkable. Cardiovascular: Regular rhythm.  Lungs: Normal work of breathing. Neurologic: No focal deficits.   Lab Results  Component Value Date   CREATININE 1.14 (H) 10/06/2019   BUN 21 10/06/2019   NA 140 10/06/2019   K 4.3 10/06/2019   CL 108 10/06/2019   CO2 22 10/06/2019   Lab Results  Component Value Date   ALT 16 03/17/2018   AST 13 03/17/2018   ALKPHOS 148 (H) 03/17/2018   BILITOT <0.2 03/17/2018   Lab Results  Component Value Date   HGBA1C 5.9 (H) 03/17/2018   HGBA1C 6.2 (H) 12/09/2017   Lab Results  Component Value Date   INSULIN 10.7 05/16/2021   INSULIN 13.4 03/17/2018   INSULIN 14.7 12/09/2017   Lab Results  Component Value Date   TSH 2.620 05/16/2021   Lab  Results  Component Value Date   CHOL 253 (H) 03/17/2018   HDL 64 03/17/2018   LDLCALC 162 (H) 03/17/2018   LDLDIRECT 146.2 09/04/2010   TRIG 137 03/17/2018   CHOLHDL 6 09/04/2010   Lab Results  Component Value Date   VD25OH 52.2 03/17/2018   VD25OH 37.6 12/09/2017   Lab Results  Component Value Date   WBC 11.7 (H) 10/06/2019   HGB 11.9 (L) 10/06/2019   HCT 38.1 10/06/2019   MCV 86.4 10/06/2019    PLT 339 10/06/2019   No results found for: "IRON", "TIBC", "FERRITIN"  Attestation Statements:   Reviewed by clinician on day of visit: allergies, medications, problem list, medical history, surgical history, family history, social history, and previous encounter notes.  Time spent on visit including pre-visit chart review and post-visit care and charting was 30 minutes.   I, Brendell Tyus, RMA, am acting as transcriptionist for Everardo Pacific, FNP..  I have reviewed the above documentation for accuracy and completeness, and I agree with the above. Everardo Pacific, FNP

## 2021-09-26 ENCOUNTER — Encounter (INDEPENDENT_AMBULATORY_CARE_PROVIDER_SITE_OTHER): Payer: Self-pay | Admitting: Nurse Practitioner

## 2021-09-26 ENCOUNTER — Ambulatory Visit (INDEPENDENT_AMBULATORY_CARE_PROVIDER_SITE_OTHER): Payer: PPO | Admitting: Nurse Practitioner

## 2021-09-26 VITALS — BP 110/76 | HR 79 | Temp 97.9°F | Ht 64.0 in | Wt 176.0 lb

## 2021-09-26 DIAGNOSIS — E6609 Other obesity due to excess calories: Secondary | ICD-10-CM

## 2021-09-26 DIAGNOSIS — F3289 Other specified depressive episodes: Secondary | ICD-10-CM

## 2021-09-26 DIAGNOSIS — Z6830 Body mass index (BMI) 30.0-30.9, adult: Secondary | ICD-10-CM | POA: Diagnosis not present

## 2021-09-26 DIAGNOSIS — Z6832 Body mass index (BMI) 32.0-32.9, adult: Secondary | ICD-10-CM

## 2021-09-26 DIAGNOSIS — E669 Obesity, unspecified: Secondary | ICD-10-CM

## 2021-09-27 ENCOUNTER — Encounter (INDEPENDENT_AMBULATORY_CARE_PROVIDER_SITE_OTHER): Payer: Self-pay | Admitting: Nurse Practitioner

## 2021-09-27 NOTE — Progress Notes (Signed)
Chief Complaint:   OBESITY Wanda Burns is here to discuss her progress with her obesity treatment plan along with follow-up of her obesity related diagnoses. Wanda Burns is on following a lower carbohydrate, vegetable and lean protein rich diet plan and states she is following her eating plan approximately 90% of the time. Wanda Burns states she is walking 30 minutes 4 times per week.  Today's visit was #: 7 Starting weight: 190 lbs Starting date: 05/16/2021 Today's weight: 176 lbs Today's date: 09/26/2021 Total lbs lost to date: 14 lbs Total lbs lost since last in-office visit: 4  Interim History: Wanda Burns has done well with weight loss since her last visit. She is alternating between LCP and Cat 1 plan. She gets tired of weighing everything.  She is using Lose it App. Calories: 1100+, protein: 90. She is drinking water daily.  Subjective:   1. Other depression with emotional eating Wanda Burns is taking Wellbutrin, she thinks 300 mg. Denies side effects and is doing well.  Assessment/Plan:   1. Other depression with emotional eating Wanda Burns will continue to follow up with PCP. She will continue medications as directed. She will send me a picture of prescription bottle so I can update in her medication list with current dose.   2. Obesity, current BMI 30.2 Wanda Burns is currently in the action stage of change. As such, her goal is to continue with weight loss efforts. She has agreed to the Category 1 Plan and following a lower carbohydrate, vegetable and lean protein rich diet plan.   Exercise goals: As is.  Wanda Burns has labs scheduled with PCP in Sept. She is to bring in laptop at next visit. We will review macros with patient.  Behavioral modification strategies: increasing lean protein intake, increasing water intake, and planning for success.  Wanda Burns has agreed to follow-up with our clinic in 4 weeks. She was informed of the importance of frequent follow-up visits to maximize her success with intensive  lifestyle modifications for her multiple health conditions.   Objective:   Blood pressure 110/76, pulse 79, temperature 97.9 F (36.6 C), height '5\' 4"'$  (1.626 m), weight 176 lb (79.8 kg), SpO2 95 %. Body mass index is 30.21 kg/m.  General: Cooperative, alert, well developed, in no acute distress. HEENT: Conjunctivae and lids unremarkable. Cardiovascular: Regular rhythm.  Lungs: Normal work of breathing. Neurologic: No focal deficits.   Lab Results  Component Value Date   CREATININE 1.14 (H) 10/06/2019   BUN 21 10/06/2019   NA 140 10/06/2019   K 4.3 10/06/2019   CL 108 10/06/2019   CO2 22 10/06/2019   Lab Results  Component Value Date   ALT 16 03/17/2018   AST 13 03/17/2018   ALKPHOS 148 (H) 03/17/2018   BILITOT <0.2 03/17/2018   Lab Results  Component Value Date   HGBA1C 5.9 (H) 03/17/2018   HGBA1C 6.2 (H) 12/09/2017   Lab Results  Component Value Date   INSULIN 10.7 05/16/2021   INSULIN 13.4 03/17/2018   INSULIN 14.7 12/09/2017   Lab Results  Component Value Date   TSH 2.620 05/16/2021   Lab Results  Component Value Date   CHOL 253 (H) 03/17/2018   HDL 64 03/17/2018   LDLCALC 162 (H) 03/17/2018   LDLDIRECT 146.2 09/04/2010   TRIG 137 03/17/2018   CHOLHDL 6 09/04/2010   Lab Results  Component Value Date   VD25OH 52.2 03/17/2018   VD25OH 37.6 12/09/2017   Lab Results  Component Value Date   WBC 11.7 (H)  10/06/2019   HGB 11.9 (L) 10/06/2019   HCT 38.1 10/06/2019   MCV 86.4 10/06/2019   PLT 339 10/06/2019   No results found for: "IRON", "TIBC", "FERRITIN"  Attestation Statements:   Reviewed by clinician on day of visit: allergies, medications, problem list, medical history, surgical history, family history, social history, and previous encounter notes.  Time spent on visit including pre-visit chart review and post-visit care and charting was 20 minutes.   I, Brendell Tyus, RMA, am acting as transcriptionist for Everardo Pacific, FNP.  I  have reviewed the above documentation for accuracy and completeness, and I agree with the above. Everardo Pacific, FNP

## 2021-10-17 ENCOUNTER — Encounter (INDEPENDENT_AMBULATORY_CARE_PROVIDER_SITE_OTHER): Payer: Self-pay

## 2021-10-22 ENCOUNTER — Ambulatory Visit (INDEPENDENT_AMBULATORY_CARE_PROVIDER_SITE_OTHER): Payer: PPO | Admitting: Nurse Practitioner

## 2021-10-22 ENCOUNTER — Encounter (INDEPENDENT_AMBULATORY_CARE_PROVIDER_SITE_OTHER): Payer: Self-pay | Admitting: Nurse Practitioner

## 2021-10-22 VITALS — BP 118/77 | HR 66 | Temp 98.1°F | Ht 64.0 in | Wt 176.0 lb

## 2021-10-22 DIAGNOSIS — Z6831 Body mass index (BMI) 31.0-31.9, adult: Secondary | ICD-10-CM | POA: Diagnosis not present

## 2021-10-22 DIAGNOSIS — F3289 Other specified depressive episodes: Secondary | ICD-10-CM

## 2021-10-22 DIAGNOSIS — E669 Obesity, unspecified: Secondary | ICD-10-CM

## 2021-10-22 DIAGNOSIS — E6609 Other obesity due to excess calories: Secondary | ICD-10-CM

## 2021-10-22 MED ORDER — CITALOPRAM HYDROBROMIDE 10 MG PO TABS: 10.0000 mg | ORAL_TABLET | Freq: Every day | ORAL | 0 refills | Status: DC

## 2021-10-29 NOTE — Progress Notes (Unsigned)
Chief Complaint:   OBESITY Wanda Burns is here to discuss her progress with her obesity treatment plan along with follow-up of her obesity related diagnoses. Wanda Burns is on following a lower carbohydrate, vegetable and lean protein rich diet plan and states she is following her eating plan approximately 85% of the time. Wanda Burns states she is exercising 0 minutes 0 times per week.  Today's visit was #: 7 Starting weight: 190 lbs Starting date: 05/16/21 Today's weight: 176 lbs Today's date: 10/22/2021 Total lbs lost to date: 14 lbs Total lbs lost since last in-office visit: 0  Interim History: [name] has maintained her weight since last visit. Surprised that she did not gain weight. She did not follow LCP like she wanted to. Struggling with depression and with increased hunger after every meal. Notes she eats due to depression and boredom. Calories are;1000 and protein is 70 grams.  Subjective:   1. Other depression with emotional eating Wanda Burns is taking Wellbutrin XL 300 mg, feels depression is getting worse. She took Prozac in the past, stopped because she did not want to take anything chemical. Feels emotions are not controlled on Wellbutrin. She felt she had no emotions on Prozac. Took around 4 years ago. She finds that she cries easier. Saw Dr. Mallie Mussel in the past. Needs more control and does not want to see a therapist. Denies suicidal ideas, and homicidal ideas.   Assessment/Plan:   1. Other depression with emotional eating Wanda Burns will Start Celexa 10 mg daily for 1 month with 0 refills. Side effects discussed.   -Start/Fill citalopram (CELEXA) 10 MG tablet; Take 1 tablet (10 mg total) by mouth daily.  Dispense: 30 tablet; Refill: 0  2. Obesity, current BMI 31.3 Wanda Burns is currently in the action stage of change. As such, her goal is to continue with weight loss efforts. She has agreed to the Category 1 Plan.   Exercise goals: All adults should avoid inactivity. Some physical activity is  better than none, and adults who participate in any amount of physical activity gain some health benefits.  Options discussed today. 1) Change Metformin to 500 mg twice a day. 2) Add Celexa. She is scheduled for labs on 11/05/21.  Behavioral modification strategies: increasing lean protein intake, increasing water intake, and no skipping meals.  Wanda Burns has agreed to follow-up with our clinic in 3 weeks. She was informed of the importance of frequent follow-up visits to maximize her success with intensive lifestyle modifications for her multiple health conditions.   Objective:   Blood pressure 118/77, pulse 66, temperature 98.1 F (36.7 C), height '5\' 4"'$  (1.626 m), weight 176 lb (79.8 kg), SpO2 96 %. Body mass index is 30.21 kg/m.  General: Cooperative, alert, well developed, in no acute distress. HEENT: Conjunctivae and lids unremarkable. Cardiovascular: Regular rhythm.  Lungs: Normal work of breathing. Neurologic: No focal deficits.   Lab Results  Component Value Date   CREATININE 1.14 (H) 10/06/2019   BUN 21 10/06/2019   NA 140 10/06/2019   K 4.3 10/06/2019   CL 108 10/06/2019   CO2 22 10/06/2019   Lab Results  Component Value Date   ALT 16 03/17/2018   AST 13 03/17/2018   ALKPHOS 148 (H) 03/17/2018   BILITOT <0.2 03/17/2018   Lab Results  Component Value Date   HGBA1C 5.9 (H) 03/17/2018   HGBA1C 6.2 (H) 12/09/2017   Lab Results  Component Value Date   INSULIN 10.7 05/16/2021   INSULIN 13.4 03/17/2018   INSULIN 14.7  12/09/2017   Lab Results  Component Value Date   TSH 2.620 05/16/2021   Lab Results  Component Value Date   CHOL 253 (H) 03/17/2018   HDL 64 03/17/2018   LDLCALC 162 (H) 03/17/2018   LDLDIRECT 146.2 09/04/2010   TRIG 137 03/17/2018   CHOLHDL 6 09/04/2010   Lab Results  Component Value Date   VD25OH 52.2 03/17/2018   VD25OH 37.6 12/09/2017   Lab Results  Component Value Date   WBC 11.7 (H) 10/06/2019   HGB 11.9 (L) 10/06/2019   HCT 38.1  10/06/2019   MCV 86.4 10/06/2019   PLT 339 10/06/2019   No results found for: "IRON", "TIBC", "FERRITIN"  Attestation Statements:   Reviewed by clinician on day of visit: allergies, medications, problem list, medical history, surgical history, family history, social history, and previous encounter notes.  I, Brendell Tyus, RMA, am acting as transcriptionist for Everardo Pacific, FNP.  I have reviewed the above documentation for accuracy and completeness, and I agree with the above. Everardo Pacific, FNP

## 2021-11-14 ENCOUNTER — Encounter (INDEPENDENT_AMBULATORY_CARE_PROVIDER_SITE_OTHER): Payer: Self-pay | Admitting: Nurse Practitioner

## 2021-11-14 ENCOUNTER — Ambulatory Visit (INDEPENDENT_AMBULATORY_CARE_PROVIDER_SITE_OTHER): Payer: PPO | Admitting: Nurse Practitioner

## 2021-11-14 VITALS — BP 111/76 | HR 61 | Temp 98.4°F | Ht 64.0 in | Wt 175.0 lb

## 2021-11-14 DIAGNOSIS — R7303 Prediabetes: Secondary | ICD-10-CM | POA: Diagnosis not present

## 2021-11-14 DIAGNOSIS — F3289 Other specified depressive episodes: Secondary | ICD-10-CM | POA: Diagnosis not present

## 2021-11-14 DIAGNOSIS — E7849 Other hyperlipidemia: Secondary | ICD-10-CM | POA: Diagnosis not present

## 2021-11-14 DIAGNOSIS — E6609 Other obesity due to excess calories: Secondary | ICD-10-CM

## 2021-11-14 DIAGNOSIS — E669 Obesity, unspecified: Secondary | ICD-10-CM

## 2021-11-14 DIAGNOSIS — Z6830 Body mass index (BMI) 30.0-30.9, adult: Secondary | ICD-10-CM

## 2021-11-14 MED ORDER — METFORMIN HCL 500 MG PO TABS: 500.0000 mg | ORAL_TABLET | Freq: Two times a day (BID) | ORAL | 0 refills | Status: DC

## 2021-11-14 MED ORDER — CITALOPRAM HYDROBROMIDE 10 MG PO TABS: 10.0000 mg | ORAL_TABLET | Freq: Every day | ORAL | 0 refills | Status: DC

## 2021-11-14 MED ORDER — METFORMIN HCL 500 MG PO TABS: 500.0000 mg | ORAL_TABLET | Freq: Two times a day (BID) | ORAL | 3 refills | Status: DC

## 2021-11-14 NOTE — Patient Instructions (Signed)
The 10-year ASCVD risk score (Arnett DK, et al., 2019) is: 5.9%   Values used to calculate the score:     Age: 71 years     Sex: Female     Is Non-Hispanic African American: No     Diabetic: No     Tobacco smoker: No     Systolic Blood Pressure: 449 mmHg     Is BP treated: No     HDL Cholesterol: 62 MG/DL     Total Cholesterol: 225 MG/DL

## 2021-11-19 NOTE — Progress Notes (Signed)
Chief Complaint:   OBESITY Nature is here to discuss her progress with her obesity treatment plan along with follow-up of her obesity related diagnoses. Wanda Burns is on the Category 1 Plan and states she is following her eating plan approximately 90% of the time. Wanda Burns states she is Exercising 0 minutes 0 times per week.  Today's visit was #: 8 Starting weight: 190 lbs Starting date: 05/16/2021 Today's weight: 175 Today's date: 11/14/2021 Total lbs lost to date: 15 lbs Total lbs lost since last in-office visit: 1  Interim History: Wanda Burns reports doing well. Bored with Cat 1 choices. Doing better since starting Celexa. Her highest weight was 206 lbs. She is struggling with polyphagia. Notes some cravings.  Subjective:   1. Pre-diabetes Wanda Burns's last A1c was 5.8 on 11/08/21. She is taking Metformin XR 500 mg. Occasional diarrhea. Reports polyphagia.   2. Other hyperlipidemia Wanda Burns has never been on medication. Family history: mother; brother.  3. Other depression with emotional eating Wanda Burns started Celexa 10 mg after her last visit. Denies any side effects. Somewhat feeling better this week. Would like to continue and not increase dose. Denies suicidal ideas, and homicidal ideas.  Assessment/Plan:   1. Pre-diabetes We will Increase/Refill Metformin 500 mg twice a day for 1 month with 0 refills.  -Increase/Refill metFORMIN (GLUCOPHAGE) 500 MG tablet; Take 1 tablet (500 mg total) by mouth 2 (two) times daily with a meal.  Dispense: 60 tablet; Refill: 0  Wanda Burns will continue to work on weight loss, exercise, and decreasing simple carbohydrates to help decrease the risk of diabetes.    2. Other hyperlipidemia ASCVD reviewed with patient. Cardiovascular risk and specific lipid/LDL goals reviewed.  We discussed several lifestyle modifications today and Wanda Burns will continue to work on diet, exercise and weight loss efforts. Orders and follow up as documented in patient record.   The 10-year  ASCVD risk score (Arnett DK, et al., 2019) is: 5.9%   Values used to calculate the score:     Age: 71 years     Sex: Female     Is Non-Hispanic African American: No     Diabetic: No     Tobacco smoker: No     Systolic Blood Pressure: 196 mmHg     Is BP treated: No     HDL Cholesterol: 62 MG/DL     Total Cholesterol: 225 MG/DL  Counseling Intensive lifestyle modifications are the first line treatment for this issue. Dietary changes: Increase soluble fiber. Decrease simple carbohydrates. Exercise changes: Moderate to vigorous-intensity aerobic activity 150 minutes per week if tolerated. Lipid-lowering medications: see documented in medical record.   3. Other depression with emotional eating We will refill Celexa 10 mg daily for 1 month with 0 refills.  -Refill citalopram (CELEXA) 10 MG tablet; Take 1 tablet (10 mg total) by mouth daily.  Dispense: 30 tablet; Refill: 0  4. Obesity, current BMI 30.1 Wanda Burns is currently in the action stage of change. As such, her goal is to continue with weight loss efforts. She has agreed to the Category 1 Plan.   Labs reviewed with patient in chart from PCP's labs on 11/08/21.  Exercise goals: All adults should avoid inactivity. Some physical activity is better than none, and adults who participate in any amount of physical activity gain some health benefits.  Behavioral modification strategies: increasing lean protein intake, increasing vegetables, and increasing water intake.  Wanda Burns has agreed to follow-up with our clinic in 2 weeks. She was informed of the  importance of frequent follow-up visits to maximize her success with intensive lifestyle modifications for her multiple health conditions.   Objective:   Blood pressure 111/76, pulse 61, temperature 98.4 F (36.9 C), height '5\' 4"'$  (1.626 m), weight 175 lb (79.4 kg), SpO2 97 %. Body mass index is 30.04 kg/m.  General: Cooperative, alert, well developed, in no acute distress. HEENT:  Conjunctivae and lids unremarkable. Cardiovascular: Regular rhythm.  Lungs: Normal work of breathing. Neurologic: No focal deficits.   Lab Results  Component Value Date   CREATININE 1.14 (H) 10/06/2019   BUN 21 10/06/2019   NA 140 10/06/2019   K 4.3 10/06/2019   CL 108 10/06/2019   CO2 22 10/06/2019   Lab Results  Component Value Date   ALT 16 03/17/2018   AST 13 03/17/2018   ALKPHOS 148 (H) 03/17/2018   BILITOT <0.2 03/17/2018   Lab Results  Component Value Date   HGBA1C 5.9 (H) 03/17/2018   HGBA1C 6.2 (H) 12/09/2017   Lab Results  Component Value Date   INSULIN 10.7 05/16/2021   INSULIN 13.4 03/17/2018   INSULIN 14.7 12/09/2017   Lab Results  Component Value Date   TSH 2.620 05/16/2021   Lab Results  Component Value Date   CHOL 253 (H) 03/17/2018   HDL 64 03/17/2018   LDLCALC 162 (H) 03/17/2018   LDLDIRECT 146.2 09/04/2010   TRIG 137 03/17/2018   CHOLHDL 6 09/04/2010   Lab Results  Component Value Date   VD25OH 52.2 03/17/2018   VD25OH 37.6 12/09/2017   Lab Results  Component Value Date   WBC 11.7 (H) 10/06/2019   HGB 11.9 (L) 10/06/2019   HCT 38.1 10/06/2019   MCV 86.4 10/06/2019   PLT 339 10/06/2019   No results found for: "IRON", "TIBC", "FERRITIN"  Attestation Statements:   Reviewed by clinician on day of visit: allergies, medications, problem list, medical history, surgical history, family history, social history, and previous encounter notes.  I, Brendell Tyus, RMA, am acting as transcriptionist for Everardo Pacific, FNP.  I have reviewed the above documentation for accuracy and completeness, and I agree with the above. Everardo Pacific, FNP

## 2021-11-28 ENCOUNTER — Ambulatory Visit (INDEPENDENT_AMBULATORY_CARE_PROVIDER_SITE_OTHER): Payer: PPO | Admitting: Nurse Practitioner

## 2021-11-28 ENCOUNTER — Encounter (INDEPENDENT_AMBULATORY_CARE_PROVIDER_SITE_OTHER): Payer: Self-pay | Admitting: Nurse Practitioner

## 2021-11-28 VITALS — BP 119/84 | HR 70 | Temp 98.3°F | Ht 64.0 in | Wt 173.0 lb

## 2021-11-28 DIAGNOSIS — E6609 Other obesity due to excess calories: Secondary | ICD-10-CM

## 2021-11-28 DIAGNOSIS — Z6829 Body mass index (BMI) 29.0-29.9, adult: Secondary | ICD-10-CM

## 2021-11-28 DIAGNOSIS — E669 Obesity, unspecified: Secondary | ICD-10-CM

## 2021-11-28 DIAGNOSIS — R7303 Prediabetes: Secondary | ICD-10-CM | POA: Diagnosis not present

## 2021-11-28 DIAGNOSIS — F3289 Other specified depressive episodes: Secondary | ICD-10-CM

## 2021-11-28 MED ORDER — CITALOPRAM HYDROBROMIDE 10 MG PO TABS: 10.0000 mg | ORAL_TABLET | Freq: Every day | ORAL | 0 refills | Status: DC

## 2021-11-29 NOTE — Progress Notes (Signed)
Chief Complaint:   OBESITY Wanda Burns is here to discuss her progress with her obesity treatment plan along with follow-up of her obesity related diagnoses. Wanda Burns is on the Category 1 Plan and states she is following her eating plan approximately 90% of the time. Wanda Burns states she is exercising 0 minutes 0 times per week.  Today's visit was #: 9 Starting weight: 190 lbs Starting date: 05/16/2021 Today's weight: 173 lbs Today's date: 11/28/2021 Total lbs lost to date: 17 lbs Total lbs lost since last in-office visit: 2  Interim History: Wanda Burns has done well with weight loss since her last visit. Her highest weight was 206 lbs. Staring a part time job at [employer] 5 days per week--4 hrs daily. She is doing well with Cat 1 plan. Recently tried low carb plan and struggled with missing bread so changed back to Cat 1 plan.   Subjective:   1. Pre-diabetes Wanda Burns's last visit , Metformin was increased to 500 mg twice a day. Occasional side effects of diarrhea. Has helped to decrease polyphagia and cravings.  2. Other depression with emotional eating Wanda Burns is taking Celexa 10 mg. Denies any side effects. Doing well.  Assessment/Plan:   1. Pre-diabetes Continue Metformin 500 mg twice a day. Side effects discussed.   2. Other depression with emotional eating We will refill Celexa 10 mg daily for 3 months with 0 refills.  Side effects discussed.  -Refill citalopram (CELEXA) 10 MG tablet; Take 1 tablet (10 mg total) by mouth daily.  Dispense: 90 tablet; Refill: 0  3. Obesity, current BMI 29.7 Wanda Burns is currently in the action stage of change. As such, her goal is to continue with weight loss efforts. She has agreed to the Category 1 Plan.   Exercise goals: All adults should avoid inactivity. Some physical activity is better than none, and adults who participate in any amount of physical activity gain some health benefits.  Behavioral modification strategies: increasing lean  protein intake, increasing water intake, and planning for success.  Wanda Burns has agreed to follow-up with our clinic in 3 weeks. She was informed of the importance of frequent follow-up visits to maximize her success with intensive lifestyle modifications for her multiple health conditions.   Objective:   Blood pressure 119/84, pulse 70, temperature 98.3 F (36.8 C), height '5\' 4"'$  (1.626 m), weight 173 lb (78.5 kg), SpO2 100 %. Body mass index is 29.7 kg/m.  General: Cooperative, alert, well developed, in no acute distress. HEENT: Conjunctivae and lids unremarkable. Cardiovascular: Regular rhythm.  Lungs: Normal work of breathing. Neurologic: No focal deficits.   Lab Results  Component Value Date   CREATININE 1.14 (H) 10/06/2019   BUN 21 10/06/2019   NA 140 10/06/2019   K 4.3 10/06/2019   CL 108 10/06/2019   CO2 22 10/06/2019   Lab Results  Component Value Date   ALT 16 03/17/2018   AST 13 03/17/2018   ALKPHOS 148 (H) 03/17/2018   BILITOT <0.2 03/17/2018   Lab Results  Component Value Date   HGBA1C 5.9 (H) 03/17/2018   HGBA1C 6.2 (H) 12/09/2017   Lab Results  Component Value Date   INSULIN 10.7 05/16/2021   INSULIN 13.4 03/17/2018   INSULIN 14.7 12/09/2017   Lab Results  Component Value Date   TSH 2.620 05/16/2021   Lab Results  Component Value Date   CHOL 253 (H) 03/17/2018   HDL 64 03/17/2018   LDLCALC 162 (H) 03/17/2018   LDLDIRECT 146.2 09/04/2010  TRIG 137 03/17/2018   CHOLHDL 6 09/04/2010   Lab Results  Component Value Date   VD25OH 52.2 03/17/2018   VD25OH 37.6 12/09/2017   Lab Results  Component Value Date   WBC 11.7 (H) 10/06/2019   HGB 11.9 (L) 10/06/2019   HCT 38.1 10/06/2019   MCV 86.4 10/06/2019   PLT 339 10/06/2019   No results found for: "IRON", "TIBC", "FERRITIN"  Attestation Statements:   Reviewed by clinician on day of visit: allergies, medications, problem list, medical history, surgical history, family history, social history,  and previous encounter notes.  I, Brendell Tyus, RMA, am acting as transcriptionist for Everardo Pacific, FNP.  I have reviewed the above documentation for accuracy and completeness, and I agree with the above. Everardo Pacific, FNP

## 2021-12-12 ENCOUNTER — Other Ambulatory Visit (INDEPENDENT_AMBULATORY_CARE_PROVIDER_SITE_OTHER): Payer: Self-pay | Admitting: Nurse Practitioner

## 2021-12-12 DIAGNOSIS — F3289 Other specified depressive episodes: Secondary | ICD-10-CM

## 2021-12-20 ENCOUNTER — Encounter (INDEPENDENT_AMBULATORY_CARE_PROVIDER_SITE_OTHER): Payer: Self-pay | Admitting: Family Medicine

## 2021-12-20 ENCOUNTER — Ambulatory Visit (INDEPENDENT_AMBULATORY_CARE_PROVIDER_SITE_OTHER): Payer: PPO | Admitting: Family Medicine

## 2021-12-20 VITALS — BP 111/78 | HR 63 | Temp 97.8°F | Ht 64.0 in | Wt 171.0 lb

## 2021-12-20 DIAGNOSIS — E6609 Other obesity due to excess calories: Secondary | ICD-10-CM

## 2021-12-20 DIAGNOSIS — Z6829 Body mass index (BMI) 29.0-29.9, adult: Secondary | ICD-10-CM | POA: Diagnosis not present

## 2021-12-20 DIAGNOSIS — R7303 Prediabetes: Secondary | ICD-10-CM

## 2021-12-20 DIAGNOSIS — E669 Obesity, unspecified: Secondary | ICD-10-CM

## 2022-01-08 NOTE — Progress Notes (Signed)
Chief Complaint:   OBESITY Brinlyn is here to discuss her progress with her obesity treatment plan along with follow-up of her obesity related diagnoses. Harlyn is on the Category 1 Plan and states she is following her eating plan approximately 90% of the time. Azyah states she is mowing and yard work 10-45 minutes 1 times per week.  Today's visit was #: 10 Starting weight: 190 lbs Starting date: 05/16/2021 Today's weight: 171 lbs Today's date: 12/20/2021 Total lbs lost to date: 19 Total lbs lost since last in-office visit: 2  Interim History: This is Aubriauna's first OV with me, as she has previously seen Dr. Owens Shark and NP Colletta Maryland. Pt has been journaling her intake daily via [employer]. 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  Subjective:   1. Pre-diabetes On 11/14/2021, Colletta Maryland, NP changed Metformin from XR to the 12-hour formula. Pt now has A LOT of diarrhea.  Assessment/Plan:  No orders of the defined types were placed in this encounter.   There are no discontinued medications.   No orders of the defined types were placed in this encounter.    1. Pre-diabetes Toneshia will continue to work on weight loss, exercise, and decreasing simple carbohydrates to help decrease the risk of diabetes.  Discontinue Metformin all together. Long discussion with pt to increase protein and fiber and simple carbs.  Pt educated on how to accurately journal.  2. Obesity, current BMI 29.4 Calianna is currently in the action stage of change. As such, her goal is to continue with weight loss efforts. She has agreed to keeping a food journal and adhering to recommended goals of 1000-1100 calories and 80++ grams protein.   Exercise goals:  As is  Behavioral modification strategies: planning for success and keeping a strict food journal.  Tonisha has agreed to follow-up with our clinic in 3-4 weeks. She was informed of the importance of frequent follow-up visits to  maximize her success with intensive lifestyle modifications for her multiple health conditions.   Objective:   Blood pressure 111/78, pulse 63, temperature 97.8 F (36.6 C), height '5\' 4"'$  (1.626 m), weight 171 lb (77.6 kg), SpO2 99 %. Body mass index is 29.35 kg/m.  General: Cooperative, alert, well developed, in no acute distress. HEENT: Conjunctivae and lids unremarkable. Cardiovascular: Regular rhythm.  Lungs: Normal work of breathing. Neurologic: No focal deficits.   Lab Results  Component Value Date   CREATININE 1.14 (H) 10/06/2019   BUN 21 10/06/2019   NA 140 10/06/2019   K 4.3 10/06/2019   CL 108 10/06/2019   CO2 22 10/06/2019   Lab Results  Component Value Date   ALT 16 03/17/2018   AST 13 03/17/2018   ALKPHOS 148 (H) 03/17/2018   BILITOT <0.2 03/17/2018   Lab Results  Component Value Date   HGBA1C 5.9 (H) 03/17/2018   HGBA1C 6.2 (H) 12/09/2017   Lab Results  Component Value Date   INSULIN 10.7 05/16/2021   INSULIN 13.4 03/17/2018   INSULIN 14.7 12/09/2017   Lab Results  Component Value Date   TSH 2.620 05/16/2021   Lab Results  Component Value Date   CHOL 253 (H) 03/17/2018   HDL 64 03/17/2018   LDLCALC 162 (H) 03/17/2018   LDLDIRECT 146.2 09/04/2010   TRIG 137 03/17/2018   CHOLHDL 6 09/04/2010   Lab Results  Component Value Date   VD25OH 52.2 03/17/2018   VD25OH 37.6 12/09/2017   Lab Results  Component Value  Date   WBC 11.7 (H) 10/06/2019   HGB 11.9 (L) 10/06/2019   HCT 38.1 10/06/2019   MCV 86.4 10/06/2019   PLT 339 10/06/2019   No results found for: "IRON", "TIBC", "FERRITIN"  Obesity Behavioral Intervention:   Approximately 15 minutes were spent on the discussion below.  ASK: We discussed the diagnosis of obesity with Malerie today and Albana agreed to give [place] permission to discuss obesity behavioral modification therapy today.  ASSESS: Leira has the diagnosis of obesity and her BMI today is 29.4. Lamyra is in the action stage of  change.   ADVISE: Shreshta was educated on the multiple health risks of obesity as well as the benefit of weight loss to improve her health. She was advised of the need for long term treatment and the importance of lifestyle modifications to improve her current health and to decrease her risk of future health problems.  AGREE: Multiple dietary modification options and treatment options were discussed and Alissah agreed to follow the recommendations documented in the above note.  ARRANGE: Analisia was educated on the importance of frequent visits to treat obesity as outlined per CMS and USPSTF guidelines and agreed to schedule her next follow up appointment today.  Attestation Statements:   Reviewed by clinician on day of visit: allergies, medications, problem list, medical history, surgical history, family history, social history, and previous encounter notes.  I, Kathlene November, BS, CMA, am acting as transcriptionist for [employer], DO.   I have reviewed the above documentation for accuracy and completeness, and I agree with the above. Marjory Sneddon, D.O.  The [place] was signed into law in 2016 which includes the topic of electronic health records.  This provides immediate access to information in MyChart.  This includes consultation notes, operative notes, office notes, lab results and pathology reports.  If you have any questions about what you read please let us know at your next visit so we can discuss your concerns and take corrective action if need be.  We are right here with you.

## 2022-01-14 ENCOUNTER — Ambulatory Visit (INDEPENDENT_AMBULATORY_CARE_PROVIDER_SITE_OTHER): Payer: PPO | Admitting: Family Medicine

## 2022-02-04 ENCOUNTER — Ambulatory Visit (INDEPENDENT_AMBULATORY_CARE_PROVIDER_SITE_OTHER): Payer: PPO | Admitting: Family Medicine

## 2022-02-07 ENCOUNTER — Ambulatory Visit (INDEPENDENT_AMBULATORY_CARE_PROVIDER_SITE_OTHER): Payer: PPO | Admitting: Family Medicine

## 2022-02-07 ENCOUNTER — Encounter (INDEPENDENT_AMBULATORY_CARE_PROVIDER_SITE_OTHER): Payer: Self-pay | Admitting: Internal Medicine

## 2022-02-07 ENCOUNTER — Ambulatory Visit (INDEPENDENT_AMBULATORY_CARE_PROVIDER_SITE_OTHER): Payer: PPO | Admitting: Internal Medicine

## 2022-02-07 VITALS — BP 126/78 | HR 66 | Temp 97.8°F | Ht 64.0 in | Wt 169.0 lb

## 2022-02-07 DIAGNOSIS — E669 Obesity, unspecified: Secondary | ICD-10-CM | POA: Diagnosis not present

## 2022-02-07 DIAGNOSIS — R7303 Prediabetes: Secondary | ICD-10-CM | POA: Diagnosis not present

## 2022-02-07 DIAGNOSIS — Z6829 Body mass index (BMI) 29.0-29.9, adult: Secondary | ICD-10-CM

## 2022-02-07 DIAGNOSIS — E6609 Other obesity due to excess calories: Secondary | ICD-10-CM

## 2022-02-19 NOTE — Progress Notes (Signed)
Chief Complaint:   OBESITY Wanda Burns is here to discuss her progress with her obesity treatment plan along with follow-up of her obesity related diagnoses. Wanda Burns is on keeping a food journal and adhering to recommended goals of 1000-1100 calories and 80+ grams of protein and states she is following her eating plan approximately 50% of the time. Wanda Burns states she is doing 0 minutes 0 times per week.  Today's visit was #: 11 Starting weight: 190 lbs Starting date: 05/16/2021 Today's weight: 169 lbs Today's date: 02/07/2022 Total lbs lost to date: 21 Total lbs lost since last in-office visit: 2  Interim History: This is my first encounter with Wanda Burns. She has started to work in [employer] and she has increased NEAT. She reports adequate satiety and satiation. No abnormal cravings. She is journaling and staying under 1100 calories. Metformin help with cravings. Reviewed weight trends and biometrics with the patient.   Subjective:   1. Pre-diabetes Wanda Burns's last A1c was 5.8 in August 2023 at [facility] and improved. She is asymptomatic on metformin. I discussed labs with the patient today.   Assessment/Plan:   1. Pre-diabetes Wanda Burns will continue with her weight loss therapy, and she will continue metformin 500 mg BID for diabetes prevention and also weight management.  2. Obesity, current BMI 29 Wanda Burns is currently in the action stage of change. As such, her goal is to continue with weight loss efforts. She has agreed to keeping a food journal and adhering to recommended goals of 1100 calories and 80 grams of protein daily.   She will continue metformin for incretin effects and diabetes mellitus prevention.   Exercise goals: We discussed incorporating strengthening to her weekly routine.   Behavioral modification strategies: increasing lean protein intake, increasing water intake, no skipping meals, meal planning and cooking strategies, and planning for success.  Wanda Burns  has agreed to follow-up with our clinic in 2 weeks. She was informed of the importance of frequent follow-up visits to maximize her success with intensive lifestyle modifications for her multiple health conditions.   Objective:   Blood pressure 126/78, pulse 66, temperature 97.8 F (36.6 C), height '5\' 4"'$  (1.626 m), weight 169 lb (76.7 kg), SpO2 97 %. Body mass index is 29.01 kg/m.  General: Cooperative, alert, well developed, in no acute distress. HEENT: Conjunctivae and lids unremarkable. Cardiovascular: Regular rhythm.  Lungs: Normal work of breathing. Neurologic: No focal deficits.   Lab Results  Component Value Date   CREATININE 1.14 (H) 10/06/2019   BUN 21 10/06/2019   NA 140 10/06/2019   K 4.3 10/06/2019   CL 108 10/06/2019   CO2 22 10/06/2019   Lab Results  Component Value Date   ALT 16 03/17/2018   AST 13 03/17/2018   ALKPHOS 148 (H) 03/17/2018   BILITOT <0.2 03/17/2018   Lab Results  Component Value Date   HGBA1C 5.9 (H) 03/17/2018   HGBA1C 6.2 (H) 12/09/2017   Lab Results  Component Value Date   INSULIN 10.7 05/16/2021   INSULIN 13.4 03/17/2018   INSULIN 14.7 12/09/2017   Lab Results  Component Value Date   TSH 2.620 05/16/2021   Lab Results  Component Value Date   CHOL 253 (H) 03/17/2018   HDL 64 03/17/2018   LDLCALC 162 (H) 03/17/2018   LDLDIRECT 146.2 09/04/2010   TRIG 137 03/17/2018   CHOLHDL 6 09/04/2010   Lab Results  Component Value Date   VD25OH 52.2 03/17/2018   VD25OH 37.6 12/09/2017  Lab Results  Component Value Date   WBC 11.7 (H) 10/06/2019   HGB 11.9 (L) 10/06/2019   HCT 38.1 10/06/2019   MCV 86.4 10/06/2019   PLT 339 10/06/2019   No results found for: "IRON", "TIBC", "FERRITIN"  Attestation Statements:   Reviewed by clinician on day of visit: allergies, medications, problem list, medical history, surgical history, family history, social history, and previous encounter notes.  Time spent on visit including pre-visit  chart review and post-visit care and charting was 20 minutes.   I, Trixie Dredge, am acting as transcriptionist for Thomes Dinning, MD.  I have reviewed the above documentation for accuracy and completeness, and I agree with the above.  Thomes Dinning, MD

## 2022-03-12 ENCOUNTER — Encounter (INDEPENDENT_AMBULATORY_CARE_PROVIDER_SITE_OTHER): Payer: Self-pay | Admitting: Internal Medicine

## 2022-03-12 ENCOUNTER — Ambulatory Visit (INDEPENDENT_AMBULATORY_CARE_PROVIDER_SITE_OTHER): Payer: PPO | Admitting: Internal Medicine

## 2022-03-12 VITALS — BP 139/63 | HR 68 | Temp 97.7°F | Ht 64.0 in | Wt 170.0 lb

## 2022-03-12 DIAGNOSIS — E7849 Other hyperlipidemia: Secondary | ICD-10-CM | POA: Diagnosis not present

## 2022-03-12 DIAGNOSIS — R7303 Prediabetes: Secondary | ICD-10-CM

## 2022-03-12 DIAGNOSIS — R944 Abnormal results of kidney function studies: Secondary | ICD-10-CM

## 2022-03-12 DIAGNOSIS — Z6829 Body mass index (BMI) 29.0-29.9, adult: Secondary | ICD-10-CM

## 2022-03-12 DIAGNOSIS — E669 Obesity, unspecified: Secondary | ICD-10-CM | POA: Diagnosis not present

## 2022-03-12 DIAGNOSIS — E6609 Other obesity due to excess calories: Secondary | ICD-10-CM

## 2022-03-12 NOTE — Progress Notes (Signed)
Chief Complaint:   OBESITY Wanda Burns is here to discuss her progress with her obesity treatment plan along with follow-up of her obesity related diagnoses. Wanda Burns is on keeping a food journal and adhering to recommended goals of 1100 calories and 80 grams of protein and states she is following her eating plan approximately 80% of the time. Wanda Burns states she is walking 4,000-5,000 steps 5 times per week.    Today's visit was #: 12 Starting weight: 190 lbs Starting date: 05/16/2021 Today's weight: 170 lbs Today's date: 03/12/2022 Total lbs lost to date: 20 Total lbs lost since last in-office visit: 0  Interim History: Wanda Burns presents today for follow-up.  Since last office visit she has gained 1 pound.  She has had a long recovery following upper respiratory tract infection which is now complicated and she is currently being treated for sinusitis with antibiotics and she is also doing nasal rinses.  She acknowledges deviating from nutrition plan and states she is not working physical activity has also decreased.  She stopped journaling as of 10 days.  She has not begun to journal using the lose it app.  She feels confident about getting back into reduced calorie target zone.  Subjective:   1. Pre-diabetes Most recent A1c was 5.8.  Counseled on condition and risk of progression.  She is on metformin. I discussed labs with the patient today.   2. Other hyperlipidemia Her total cholesterol was 225 with an LDL of 138.  Her calculated cardiovascular risk with blood pressure today was 10%.    3. Decreased GFR I reviewed labs including Care Everywhere she has had a GFR of 57.    Assessment/Plan:   1. Pre-diabetes Continue weight loss therapy and metformin.  2. Other hyperlipidemia Patient will be having her cholesterol repeated by PCP in February.  She has declined statin therapy.  Her blood pressure is elevated today as she has taken a decongestant for recent viral illness.  3. Decreased  GFR She will be having a repeat CMP in February.  She was counseled on maintaining adequate hydration and avoidance of nephrotoxins.  She does not take NSAIDs.  4. Obesity, current BMI 29.2 Wanda Burns is currently in the action stage of change. As such, her goal is to continue with weight loss efforts. She has agreed to the Category 1 Plan and keeping a food journal and adhering to recommended goals of 1100 calories and 80 grams of protein daily.   Exercise goals: As is, she will be going back to work.   Behavioral modification strategies: increasing lean protein intake, no skipping meals, avoiding temptations, and planning for success.  Wanda Burns has agreed to follow-up with our clinic in 3 to 4 weeks. She was informed of the importance of frequent follow-up visits to maximize her success with intensive lifestyle modifications for her multiple health conditions.   Objective:   Blood pressure 139/63, pulse 68, temperature 97.7 F (36.5 C), height '5\' 4"'$  (1.626 m), weight 170 lb (77.1 kg), SpO2 96 %. Body mass index is 29.18 kg/m.  General: Cooperative, alert, well developed, in no acute distress. HEENT: Conjunctivae and lids unremarkable. Cardiovascular: Regular rhythm.  Lungs: Normal work of breathing. Neurologic: No focal deficits.   Lab Results  Component Value Date   CREATININE 1.14 (H) 10/06/2019   BUN 21 10/06/2019   NA 140 10/06/2019   K 4.3 10/06/2019   CL 108 10/06/2019   CO2 22 10/06/2019   Lab Results  Component Value Date  ALT 16 03/17/2018   AST 13 03/17/2018   ALKPHOS 148 (H) 03/17/2018   BILITOT <0.2 03/17/2018   Lab Results  Component Value Date   HGBA1C 5.9 (H) 03/17/2018   HGBA1C 6.2 (H) 12/09/2017   Lab Results  Component Value Date   INSULIN 10.7 05/16/2021   INSULIN 13.4 03/17/2018   INSULIN 14.7 12/09/2017   Lab Results  Component Value Date   TSH 2.620 05/16/2021   Lab Results  Component Value Date   CHOL 253 (H) 03/17/2018   HDL 64 03/17/2018    LDLCALC 162 (H) 03/17/2018   LDLDIRECT 146.2 09/04/2010   TRIG 137 03/17/2018   CHOLHDL 6 09/04/2010   Lab Results  Component Value Date   VD25OH 52.2 03/17/2018   VD25OH 37.6 12/09/2017   Lab Results  Component Value Date   WBC 11.7 (H) 10/06/2019   HGB 11.9 (L) 10/06/2019   HCT 38.1 10/06/2019   MCV 86.4 10/06/2019   PLT 339 10/06/2019   No results found for: "IRON", "TIBC", "FERRITIN"  Attestation Statements:   Reviewed by clinician on day of visit: allergies, medications, problem list, medical history, surgical history, family history, social history, and previous encounter notes.   Wilhemena Durie, am acting as transcriptionist for Thomes Dinning, MD.  I have reviewed the above documentation for accuracy and completeness, and I agree with the above. -Thomes Dinning, MD

## 2022-03-13 ENCOUNTER — Ambulatory Visit (INDEPENDENT_AMBULATORY_CARE_PROVIDER_SITE_OTHER): Payer: PPO | Admitting: Family Medicine

## 2022-03-16 ENCOUNTER — Other Ambulatory Visit (INDEPENDENT_AMBULATORY_CARE_PROVIDER_SITE_OTHER): Payer: Self-pay | Admitting: Nurse Practitioner

## 2022-03-16 DIAGNOSIS — F3289 Other specified depressive episodes: Secondary | ICD-10-CM

## 2022-03-21 ENCOUNTER — Other Ambulatory Visit (INDEPENDENT_AMBULATORY_CARE_PROVIDER_SITE_OTHER): Payer: Self-pay | Admitting: Nurse Practitioner

## 2022-03-21 DIAGNOSIS — F3289 Other specified depressive episodes: Secondary | ICD-10-CM

## 2022-03-21 MED ORDER — CITALOPRAM HYDROBROMIDE 10 MG PO TABS: 10.0000 mg | ORAL_TABLET | Freq: Every day | ORAL | 0 refills | Status: DC

## 2022-04-02 ENCOUNTER — Ambulatory Visit (INDEPENDENT_AMBULATORY_CARE_PROVIDER_SITE_OTHER): Payer: PPO | Admitting: Internal Medicine

## 2022-04-02 ENCOUNTER — Encounter (INDEPENDENT_AMBULATORY_CARE_PROVIDER_SITE_OTHER): Payer: Self-pay | Admitting: Internal Medicine

## 2022-04-02 VITALS — BP 113/74 | HR 66 | Temp 97.8°F | Ht 64.0 in | Wt 168.0 lb

## 2022-04-02 DIAGNOSIS — E7800 Pure hypercholesterolemia, unspecified: Secondary | ICD-10-CM | POA: Diagnosis not present

## 2022-04-02 DIAGNOSIS — R944 Abnormal results of kidney function studies: Secondary | ICD-10-CM

## 2022-04-02 DIAGNOSIS — Z6828 Body mass index (BMI) 28.0-28.9, adult: Secondary | ICD-10-CM

## 2022-04-02 DIAGNOSIS — E669 Obesity, unspecified: Secondary | ICD-10-CM

## 2022-04-02 DIAGNOSIS — R7303 Prediabetes: Secondary | ICD-10-CM | POA: Diagnosis not present

## 2022-04-02 DIAGNOSIS — J310 Chronic rhinitis: Secondary | ICD-10-CM

## 2022-04-02 DIAGNOSIS — E6609 Other obesity due to excess calories: Secondary | ICD-10-CM

## 2022-04-03 DIAGNOSIS — J310 Chronic rhinitis: Secondary | ICD-10-CM | POA: Insufficient documentation

## 2022-04-03 DIAGNOSIS — E7800 Pure hypercholesterolemia, unspecified: Secondary | ICD-10-CM | POA: Insufficient documentation

## 2022-04-03 DIAGNOSIS — R944 Abnormal results of kidney function studies: Secondary | ICD-10-CM | POA: Insufficient documentation

## 2022-04-03 NOTE — Progress Notes (Unsigned)
Chief Complaint:   OBESITY Wanda Burns is here to discuss her progress with her obesity treatment plan along with follow-up of her obesity related diagnoses. Wanda Burns is on the Category 1 Plan and keeping a food journal and adhering to recommended goals of 1100 calories and 80 grams of protein and states she is following her eating plan approximately 90% of the time. Wanda Burns states she is doing 0 minutes 0 times per week.  Today's visit was #: 13 Starting weight: 190 lbs Starting date: 05/16/2021 Today's weight: 168 lbs Today's date: 04/02/2022 Total lbs lost to date: 22 Total lbs lost since last in-office visit: 2  Interim History: Wanda Burns presents today for follow-up.  Since last office visit she has lost 2 pounds.  She is back to journaling and is using the lose it app.  She works at [employer] and considers her self to be active.  She is inquiring about healthy snack ideas.  She denies problems with hunger, abnormal cravings or lack of satiety.  Subjective:   1. Pre-diabetes Her last A1c was 5.8.  She is asymptomatic.  She tells me she has labs scheduled with her PCP in February so we will await those results.    2. Decreased GFR Her last GFR in our records was 50 she denies having history of chronic kidney disease.  She also does not have hypertension or diabetes and I am not sure if this has been further evaluated.    3. Non-allergic rhinitis She is not having much in regards to allergy symptoms which she describes as clear rhinorrhea.  Has been using fluticasone but not consistently.    4. Pure hypercholesterolemia Again the last labs I see on file she had an LDL of 162 in 2020.  She has been getting labs outside of the network so we need to request these.  This puts patient at borderline risk so she may or may not benefit from statin therapy.    Assessment/Plan:   1. Pre-diabetes She will continue on metformin for pharmacoprophylaxis.  No side effects reported.  Continue with weight  loss therapy and follow-up on test results.  2. Decreased GFR She will be having blood work done at her PCPs office we will request records at that and determine if additional workup is warranted.  Particularly in the absence of hypertension and diabetes.  Patient counseled on maintaining adequate hydration and avoidance of nephrotoxins.  3. Non-allergic rhinitis We discussed possibly using Astelin or Nasalcrom over-the-counter.  She may also take loratadine 10 mg a day.  4. Pure hypercholesterolemia We will review records further and consider rechecking her lipids if not done at her physical in February.  5. Obesity, current BMI 28.8 Wanda Burns is currently in the action stage of change. As such, her goal is to continue with weight loss efforts. She has agreed to keeping a food journal and adhering to recommended goals of 1100 calories and 80 grams of protein.   Exercise goals: Wanda Burns has been instructed to work up to 150 minutes of moderate intensity aerobic activity a week and strengthening exercises 2-3 times per week for cardiovascular health, weight loss maintenance and preservation of muscle mass.   Behavioral modification strategies: increasing lean protein intake, increasing water intake, no skipping meals, meal planning and cooking strategies, avoiding temptations, and planning for success.  Wanda Burns has agreed to follow-up with our clinic in 3 weeks. She was informed of the importance of frequent follow-up visits to maximize her success with intensive  lifestyle modifications for her multiple health conditions.   Objective:   Blood pressure 113/74, pulse 66, temperature 97.8 F (36.6 C), height '5\' 4"'$  (1.626 m), weight 168 lb (76.2 kg), SpO2 96 %. Body mass index is 28.84 kg/m.  General: Cooperative, alert, well developed, in no acute distress. HEENT: Conjunctivae and lids unremarkable. Cardiovascular: Regular rhythm.  Lungs: Normal work of breathing. Neurologic: No focal deficits.    Lab Results  Component Value Date   CREATININE 1.14 (H) 10/06/2019   BUN 21 10/06/2019   NA 140 10/06/2019   K 4.3 10/06/2019   CL 108 10/06/2019   CO2 22 10/06/2019   Lab Results  Component Value Date   ALT 16 03/17/2018   AST 13 03/17/2018   ALKPHOS 148 (H) 03/17/2018   BILITOT <0.2 03/17/2018   Lab Results  Component Value Date   HGBA1C 5.9 (H) 03/17/2018   HGBA1C 6.2 (H) 12/09/2017   Lab Results  Component Value Date   INSULIN 10.7 05/16/2021   INSULIN 13.4 03/17/2018   INSULIN 14.7 12/09/2017   Lab Results  Component Value Date   TSH 2.620 05/16/2021   Lab Results  Component Value Date   CHOL 253 (H) 03/17/2018   HDL 64 03/17/2018   LDLCALC 162 (H) 03/17/2018   LDLDIRECT 146.2 09/04/2010   TRIG 137 03/17/2018   CHOLHDL 6 09/04/2010   Lab Results  Component Value Date   VD25OH 52.2 03/17/2018   VD25OH 37.6 12/09/2017   Lab Results  Component Value Date   WBC 11.7 (H) 10/06/2019   HGB 11.9 (L) 10/06/2019   HCT 38.1 10/06/2019   MCV 86.4 10/06/2019   PLT 339 10/06/2019   No results found for: "IRON", "TIBC", "FERRITIN"  Attestation Statements:   Reviewed by clinician on day of visit: allergies, medications, problem list, medical history, surgical history, family history, social history, and previous encounter notes.   Wilhemena Durie, am acting as transcriptionist for Thomes Dinning, MD.  I have reviewed the above documentation for accuracy and completeness, and I agree with the above. -Thomes Dinning, MD

## 2022-04-24 ENCOUNTER — Ambulatory Visit (INDEPENDENT_AMBULATORY_CARE_PROVIDER_SITE_OTHER): Payer: PPO

## 2022-04-24 ENCOUNTER — Ambulatory Visit (INDEPENDENT_AMBULATORY_CARE_PROVIDER_SITE_OTHER): Payer: PPO | Admitting: Podiatry

## 2022-04-24 DIAGNOSIS — M722 Plantar fascial fibromatosis: Secondary | ICD-10-CM

## 2022-04-24 DIAGNOSIS — M2041 Other hammer toe(s) (acquired), right foot: Secondary | ICD-10-CM | POA: Diagnosis not present

## 2022-04-24 DIAGNOSIS — L600 Ingrowing nail: Secondary | ICD-10-CM | POA: Diagnosis not present

## 2022-04-24 NOTE — Progress Notes (Signed)
Subjective:  Patient ID: Wanda Burns, female    DOB: 1951/03/03,  MRN: [id number] HPI Chief Complaint  Patient presents with   Foot Pain    Arch bilateral - concerned she has high arches, burning at end of day, tried OTC insoles   Toe Pain    2nd toe bilateral (R>L) - previous bunion surgery and notices now that the big toe is sitting on top of the 2nd causing the nail to become sore, had both toenails removed before, she had the 2nd left nail removed due to fungus and she is worried that is coming back   New Patient (Initial Visit)    72 y.o. female presents with the above complaint.   ROS: Denies fever chills nausea vomit muscle aches pains calf pain back pain chest pain shortness of breath.  Past Medical History:  Diagnosis Date   Anxiety    on meds   Arthritis    fingers   Cataract    bilateral sx   Chest pain    Constipation    COPD (chronic obstructive pulmonary disease) ([place])    Depression    on meds   Diabetes mellitus without complication ([place])    pre-diabetic   Diverticulosis    mild on colonoscopy 2012   Dyspnea on exertion    Edema of both lower extremities    Hay fever    History of chicken pox    History of smoking    quit 11/2010   HLD (hyperlipidemia)    Lactose intolerance    Leg edema    Obesity    Rheumatoid arthritis ([place])    Swallowing difficulty    Past Surgical History:  Procedure Laterality Date   abd [place]  2006   nothing acute, no gallbladder stones   BUNIONECTOMY Right    CATARACT EXTRACTION, BILATERAL     COLONOSCOPY  2012   DJ-F/V-movi (good)WNL, mild diverticulosis, rpt 10 yrs   DENTAL SURGERY     all teeth removed- dentures in place   LIPOMA EXCISION  2017   RIGHT forearm   LIPOMA EXCISION Right 10/02/2017   Procedure: EXCISION OF SUBCUTANEOUS LIPOMA OF RIGHT UPPER LATERAL ARM;  Surgeon: Donnie Mesa, MD;  Location: [place] [facility] [place];  Service: General;  Laterality: Right;   TUBAL LIGATION     VAGINAL HYSTERECTOMY   1990   WISDOM TOOTH EXTRACTION      Current Outpatient Medications:    acetaminophen (TYLENOL) 500 MG tablet, Take 1,000 mg by mouth every 6 (six) hours as needed for mild pain., Disp: , Rfl:    buPROPion (WELLBUTRIN XL) 300 MG 24 hr tablet, Take 300 mg by mouth every morning., Disp: , Rfl:    citalopram (CELEXA) 10 MG tablet, Take 1 tablet (10 mg total) by mouth daily., Disp: 90 tablet, Rfl: 0   LORazepam (ATIVAN) 1 MG tablet, Take 0.5-1 mg by mouth at bedtime., Disp: , Rfl:    meclizine (ANTIVERT) 25 MG tablet, Take 25 mg by mouth 3 (three) times daily as needed for dizziness., Disp: , Rfl:    meloxicam (MOBIC) 7.5 MG tablet, Take 7.5 mg by mouth 2 (two) times daily as needed., Disp: , Rfl:    metFORMIN (GLUCOPHAGE) 500 MG tablet, Take 1 tablet (500 mg total) by mouth 2 (two) times daily with a meal., Disp: 60 tablet, Rfl: 0   polyethylene glycol powder (GLYCOLAX/MIRALAX) 17 GM/SCOOP powder, Take 17 g by mouth daily., Disp: 255 g, Rfl: 0   PROAIR HFA  108 (90 Base) MCG/ACT inhaler, Inhale 2 puffs into the lungs daily at 6 (six) AM., Disp: , Rfl:   Allergies  Allergen Reactions   Codeine Itching   Prednisone Other (See Comments)    Hyper, irritable   Review of Systems Objective:  There were no vitals filed for this visit.  General: Well developed, nourished, in no acute distress, alert and oriented x3   Dermatological: Skin is warm, dry and supple bilateral. Nails x 10 are well maintained; remaining integument appears unremarkable at this time. There are no open sores, no preulcerative lesions, no rash or signs of infection present.  Painful thick dystrophic nail second digit of the right foot.  Vascular: Dorsalis Pedis artery and Posterior Tibial artery pedal pulses are 2/4 bilateral with immedate capillary fill time. Pedal hair growth present. No varicosities and no lower extremity edema present bilateral.   Neruologic: Grossly intact via light touch bilateral. Vibratory intact via  tuning fork bilateral. Protective threshold with Semmes Wienstein monofilament intact to all pedal sites bilateral. Patellar and Achilles deep tendon reflexes 2+ bilateral. No Babinski or clonus noted bilateral.   Musculoskeletal: No gross boney pedal deformities bilateral. No pain, crepitus, or limitation noted with foot and ankle range of motion bilateral. Muscular strength 5/5 in all groups tested bilateral.  Hammertoe deformity second digit right foot mild hallux valgus deformity previous surgery first metatarsophalangeal joint right.  Gait: Unassisted, Nonantalgic.    Radiographs:  Previous bunion repair retained K wires first metatarsal.  Mild hammertoe deformity second toe.  Assessment & Plan:   Assessment: Primary thing here today is her painful toenail second digit right foot  Plan: Total nail matrixectomy today.  She tolerated procedure well after local anesthetic was administered.  She was given both oral written home-going instructions of care and soaking of the toe as well as a prescription for Cortisporin Otic to be applied twice daily after soaking.  I like to follow-up with her in about 2 weeks.     Minahil Quinlivan T. [place], [place]

## 2022-04-24 NOTE — Patient Instructions (Signed)

## 2022-04-25 ENCOUNTER — Encounter (INDEPENDENT_AMBULATORY_CARE_PROVIDER_SITE_OTHER): Payer: Self-pay | Admitting: Internal Medicine

## 2022-04-25 ENCOUNTER — Ambulatory Visit (INDEPENDENT_AMBULATORY_CARE_PROVIDER_SITE_OTHER): Payer: PPO | Admitting: Internal Medicine

## 2022-04-25 VITALS — BP 116/76 | HR 76 | Temp 97.5°F | Ht 64.0 in | Wt 170.0 lb

## 2022-04-25 DIAGNOSIS — Z6829 Body mass index (BMI) 29.0-29.9, adult: Secondary | ICD-10-CM | POA: Diagnosis not present

## 2022-04-25 DIAGNOSIS — E669 Obesity, unspecified: Secondary | ICD-10-CM | POA: Diagnosis not present

## 2022-04-25 DIAGNOSIS — R7303 Prediabetes: Secondary | ICD-10-CM | POA: Diagnosis not present

## 2022-04-25 DIAGNOSIS — Z6832 Body mass index (BMI) 32.0-32.9, adult: Secondary | ICD-10-CM

## 2022-04-25 DIAGNOSIS — R944 Abnormal results of kidney function studies: Secondary | ICD-10-CM

## 2022-04-25 NOTE — Assessment & Plan Note (Signed)
Her most recent A1c was 5.8 in August of last year which is better than previous of 6.2 and 5.9 Lab Results  Component Value Date   HGBA1C 5.9 (H) 03/17/2018  .  Patient informed of disease state and risk of progression. This may contribute to abnormal cravings, fatigue and diabetes complications without having diabetes.   She will continue with weight loss therapy.  She may also be a candidate for pharmacotherapy with metformin.

## 2022-04-25 NOTE — Progress Notes (Signed)
Office: [phone]  /  Fax: [phone]  WEIGHT SUMMARY AND BIOMETRICS  Medical Weight Loss Height: 5' 4"$  (1.626 m) Weight: 170 lb (77.1 kg) Temp: (!) 97.5 F (36.4 C) Pulse Rate: 76 BP: 116/76 SpO2: 98 % Fasting: no Labs: no Today's Visit #: 14 Weight at Last VIsit: 168 lb Weight Lost Since Last Visit: +2  Body Fat %: 40.1 % Fat Mass (lbs): 68.2 lbs Muscle Mass (lbs): 96.8 lbs Total Body Water (lbs): 96.8 lbs Visceral Fat Rating : 11 Peak Weight: 209 lb Starting Date: 05/16/21 Starting Weight: 190 lb Total Weight Loss (lbs): 20 lb (9.072 kg)    HPI  Chief Complaint: OBESITY  Wanda Burns is here to discuss her progress with her obesity treatment plan. She is on the the Category 1 Plan and states she is following her eating plan approximately 85 % of the time. She states she is exercising 5 minutes 4 times per week.   Interval History:  Since last office visit she  has gained 2 pounds.  She has been dealing with a painful toe and has been less active.  She continues to journal and acknowledges there were some days where she might of eaten some unhealthy foods.  For the most part she is eating her protein goals but not in a balance way.  She reports adequate satiety and satiation.  She denies abnormal cravings.  She tells me she will be having blood work at her PCPs office in about 2 weeks.  She recently had a toenail removed and is having some pain but denies any signs or symptoms of infection.  Toenail was removed yesterday.   Pharmacotherapy: None  PHYSICAL EXAM:  Blood pressure 116/76, pulse 76, temperature (!) 97.5 F (36.4 C), height 5' 4"$  (1.626 m), weight 170 lb (77.1 kg), SpO2 98 %. Body mass index is 29.18 kg/m.  General: She is overweight, cooperative, alert, well developed, and in no acute distress. PSYCH: Has normal mood, affect and thought process.   HEENT: EOMI, sclerae are anicteric. Lungs: Normal breathing effort, no conversational  dyspnea. Extremities: No edema.  Neurologic: No gross sensory or motor deficits. No tremors or fasciculations noted.    ASSESSMENT AND PLAN  TREATMENT PLAN FOR OBESITY:  Recommended Dietary Goals  Wanda Burns is currently in the action stage of change. As such, her goal is to continue weight management plan. She has agreed to the Category 1 Plan and keeping a food journal and adhering to recommended goals of 1000 cal calories and 90 g protein.  Advised to distribute protein 30 g per meal.  Behavioral Intervention  We discussed the following Behavioral Modification Strategies today: increasing lean protein intake, increasing vegetables, increase water intake, work on meal planning and easy cooking plans, and think about ways to increase physical activity.  Additional resources provided today: NA  Recommended Physical Activity Goals  Nikkita has been advised to work up to 150 minutes of moderate intensity aerobic activity a week and strengthening exercises 2-3 times per week for cardiovascular health, weight loss maintenance and preservation of muscle mass.   She has agreed to increase physical activity in their day and reduce sedentary time (increase NEAT).    Pharmacotherapy We discussed various medication options to help Caydin with her weight loss efforts and we both agreed to continue with nutritional and behavioral strategies.  ASSOCIATED CONDITIONS ADDRESSED TODAY  Pre-diabetes Assessment & Plan: Her most recent A1c was 5.8 in August of last year which is better than previous of 6.2  and 5.9 Lab Results  Component Value Date   HGBA1C 5.9 (H) 03/17/2018  .  Patient informed of disease state and risk of progression. This may contribute to abnormal cravings, fatigue and diabetes complications without having diabetes.   She will continue with weight loss therapy.  She may also be a candidate for pharmacotherapy with metformin.    Decreased GFR Assessment & Plan: Her last GFR was  57 in August of last year.  Previous measurements were also in the 50s I suspect she may have underlying chronic kidney disease.  She is having blood work repeated at her PCPs office in 2 weeks.  Considering that she does not have diabetes or hypertension this warrants further investigation.   Obesity, current BMI 29      DIAGNOSTIC DATA REVIEWED:  BMET    Component Value Date/Time   NA 140 10/06/2019 0736   NA 137 03/17/2018 1031   K 4.3 10/06/2019 0736   CL 108 10/06/2019 0736   CO2 22 10/06/2019 0736   GLUCOSE 181 (H) 10/06/2019 0736   BUN 21 10/06/2019 0736   BUN 22 03/17/2018 1031   CREATININE 1.14 (H) 10/06/2019 0736   CALCIUM 9.3 10/06/2019 0736   GFRNONAA 50 (L) 10/06/2019 0736   GFRAA 58 (L) 10/06/2019 0736   Lab Results  Component Value Date   HGBA1C 5.9 (H) 03/17/2018   HGBA1C 6.2 (H) 12/09/2017   Lab Results  Component Value Date   INSULIN 10.7 05/16/2021   INSULIN 14.7 12/09/2017   Lab Results  Component Value Date   TSH 2.620 05/16/2021   CBC    Component Value Date/Time   WBC 11.7 (H) 10/06/2019 0736   RBC 4.41 10/06/2019 0736   HGB 11.9 (L) 10/06/2019 0736   HGB 12.8 12/09/2017 1407   HCT 38.1 10/06/2019 0736   HCT 39.7 12/09/2017 1407   PLT 339 10/06/2019 0736   MCV 86.4 10/06/2019 0736   MCV 81 12/09/2017 1407   MCH 27.0 10/06/2019 0736   MCHC 31.2 10/06/2019 0736   RDW 13.8 10/06/2019 0736   RDW 16.7 (H) 12/09/2017 1407   Iron Studies No results found for: "IRON", "TIBC", "FERRITIN", "IRONPCTSAT" Lipid Panel     Component Value Date/Time   CHOL 253 (H) 03/17/2018 1031   TRIG 137 03/17/2018 1031   HDL 64 03/17/2018 1031   CHOLHDL 6 09/04/2010 0840   VLDL 26.8 09/04/2010 0840   LDLCALC 162 (H) 03/17/2018 1031   LDLDIRECT 146.2 09/04/2010 0840   Hepatic Function Panel     Component Value Date/Time   PROT 7.3 03/17/2018 1031   ALBUMIN 4.8 03/17/2018 1031   AST 13 03/17/2018 1031   ALT 16 03/17/2018 1031   ALKPHOS 148 (H)  03/17/2018 1031   BILITOT <0.2 03/17/2018 1031      Component Value Date/Time   TSH 2.620 05/16/2021 0957   Nutritional Lab Results  Component Value Date   VD25OH 52.2 03/17/2018   VD25OH 37.6 12/09/2017      Return in about 2 weeks (around 05/09/2022) for For Weight Mangement with Dr. Gerarda Fraction.Marland Kitchen She was informed of the importance of frequent follow up visits to maximize her success with intensive lifestyle modifications for her multiple health conditions.    ATTESTASTION STATEMENTS:  Reviewed by clinician on day of visit: allergies, medications, problem list, medical history, surgical history, family history, social history, and previous encounter notes.   Time spent on visit including pre-visit chart review and post-visit care and charting was 30 minutes.  Thomes Dinning, MD

## 2022-04-25 NOTE — Assessment & Plan Note (Signed)
Her last GFR was 57 in August of last year.  Previous measurements were also in the 50s I suspect she may have underlying chronic kidney disease.  She is having blood work repeated at her PCPs office in 2 weeks.  Considering that she does not have diabetes or hypertension this warrants further investigation.

## 2022-04-29 ENCOUNTER — Telehealth: Payer: Self-pay | Admitting: *Deleted

## 2022-04-29 NOTE — Telephone Encounter (Signed)
Spoke with patient. She is she is requesting an email for work.  Excusing her for Monday and Tuesday (tomorrow). Requesting it be emailed to [email or web address]. Ammie could you please email this to her? Thanks, Dr. [facility]

## 2022-04-29 NOTE — Telephone Encounter (Signed)
Still having discomfort, just noticed oozing from right 2nd,entire tip is red, left side and would like a doctor's note today because she could not get a shoe on.she has been soaking in epsom salts/warm water, experiencing burning, suggested to try the vinegar /water or antibiotic soap / water to see if stops the burning.Please advise.

## 2022-04-30 ENCOUNTER — Telehealth: Payer: Self-pay

## 2022-05-01 NOTE — Telephone Encounter (Signed)
This has been addressed and routed to appropriate staff

## 2022-05-02 ENCOUNTER — Encounter: Payer: Self-pay | Admitting: *Deleted

## 2022-05-02 ENCOUNTER — Encounter: Payer: Self-pay | Admitting: Podiatry

## 2022-05-02 ENCOUNTER — Ambulatory Visit (INDEPENDENT_AMBULATORY_CARE_PROVIDER_SITE_OTHER): Payer: PPO

## 2022-05-02 DIAGNOSIS — L600 Ingrowing nail: Secondary | ICD-10-CM

## 2022-05-02 MED ORDER — NEOMYCIN-POLYMYXIN-HC 3.5-10000-1 OT SOLN: OTIC | 0 refills | Status: AC

## 2022-05-02 NOTE — Progress Notes (Signed)
Patient in the office today with complaint of pain, redness and drainage from the 2nd toe right nailbed after total nail removal.   Dr. Sherryle Lis did take a look at the patient's toe today and advised the patient continue vinegar soaks daily and ordered cortisporin drops to be applied to the nail bed. Patient verbalized understanding instructions given. Letter was approved to be given to patient to excuse from work the days that were missed due to the pain felt in the toe.

## 2022-05-03 NOTE — Telephone Encounter (Signed)
Letter has been written and sent to email address per her request.

## 2022-05-07 ENCOUNTER — Ambulatory Visit: Payer: PPO | Admitting: Podiatry

## 2022-05-07 ENCOUNTER — Other Ambulatory Visit: Payer: PPO

## 2022-05-08 ENCOUNTER — Encounter (INDEPENDENT_AMBULATORY_CARE_PROVIDER_SITE_OTHER): Payer: Self-pay | Admitting: Physician Assistant

## 2022-05-08 ENCOUNTER — Ambulatory Visit (INDEPENDENT_AMBULATORY_CARE_PROVIDER_SITE_OTHER): Payer: PPO | Admitting: Physician Assistant

## 2022-05-08 VITALS — BP 118/78 | HR 60 | Temp 97.8°F | Ht 64.0 in | Wt 168.6 lb

## 2022-05-08 DIAGNOSIS — Z6828 Body mass index (BMI) 28.0-28.9, adult: Secondary | ICD-10-CM | POA: Diagnosis not present

## 2022-05-08 DIAGNOSIS — E669 Obesity, unspecified: Secondary | ICD-10-CM

## 2022-05-08 DIAGNOSIS — Z6829 Body mass index (BMI) 29.0-29.9, adult: Secondary | ICD-10-CM | POA: Insufficient documentation

## 2022-05-08 DIAGNOSIS — R7303 Prediabetes: Secondary | ICD-10-CM

## 2022-05-08 NOTE — Progress Notes (Signed)
Office: [phone]  /  Fax: [phone]  WEIGHT SUMMARY AND BIOMETRICS  Vitals Temp: 97.8 F (36.6 C) BP: 118/78 Pulse Rate: 60 SpO2: 95 %   Anthropometric Measurements Height: '5\' 4"'$  (1.626 m) Weight: 168 lb 9.6 oz (76.5 kg) BMI (Calculated): 28.93   Body Composition  Body Fat %: 40.3 % Fat Mass (lbs): 68 lbs Muscle Mass (lbs): 95.4 lbs Total Body Water (lbs): 71.8 lbs Visceral Fat Rating : 11    Today's visit was #: 15 Starting weight: 190 lbs Starting date: 05/16/2021 Today's weight: 168 lbs Today's date: 05/08/2022 Total lbs lost to date: 22 lbs Total lbs lost since last in-office visit: 2 lbs.    HPI  Chief Complaint: OBESITY  Wanda Burns is here to discuss her progress with her obesity treatment plan. She is on the the Category 1 Plan and keeping a food journal and adhering to recommended goals of 1000 calories and 90 grams of protein and states she is following her eating plan approximately 90 % of the time. She states she is exercising not exercising currently. 0 minutes 0 times per week.  Interval History:  Since last office visit she has been recovering from a toe nail removal procedure, but has been trying to be mindful of protein intake and total calories. She has been less active due to above.  She reports good to prescribed reduced calorie nutrition plan.  Denies problems with appetite and hunger signals.  Denies problems with satiety and satiation.  Denies problems with eating patterns and portion control.  Sleeping approximately 8 hours a day.  Sleep described as restorative.  Stress levels are reported as low and manageable.   Reports some cravings in evenings and eats some sugar free jello, or Out shine strawberry pop or yogurt with some almonds and cinnamon or just goes to bed and does well.    Personal goal of 148-150 lbs.   Pharmacotherapy for weight loss: She is currently taking no anti-obesity medication and Metformin (off label use for  incretin effect and / or insulin resistance and / or diabetes prevention) .     ASSESSMENT AND PLAN  TREATMENT PLAN FOR OBESITY:  Recommended Dietary Goals  Wanda Burns is currently in the action stage of change. As such, her goal is to continue weight management plan. She has agreed to the Category 1 Plan and keeping a food journal and adhering to recommended goals of 1000 calories and 90 grams of protein.  Behavioral Intervention  We discussed the following Behavioral Modification Strategies today: increasing lean protein intake, increasing vegetables, increasing water intake, emotional eating strategies and understanding the difference between hunger signals and cravings, and ways to avoid night time snacking.  Additional resources provided today: NA  Recommended Physical Activity Goals  Wanda Burns has been advised to work up to 150 minutes of moderate intensity aerobic activity a week and strengthening exercises 2-3 times per week for cardiovascular health, weight loss maintenance and preservation of muscle mass.   She has agreed to : resume being more active once she is able heal sufficiently following toe nail removal.   Pharmacotherapy We discussed various medication options to help Wanda Burns with her weight loss efforts and we both agreed to continue to work on weight loss.  ASSOCIATED CONDITIONS ADDRESSED TODAY  Pre-diabetes  Generalized obesity- Start BMI 32.61  BMI 28.0-28.9,adult Current BMI 28.9   Pre-diabetes:  Pharmacotherapy for pre-diabetes:  She is currently taking metformin 500 mg twice daily per her PCP.  Denies side effects.  Last A1c was 5.9- improved Working on nutrition plan to decrease simple carbohydrates, increase lean proteins and exercise to promote weight loss, improve glycemic control and prevent progression to Type 2 diabetes.    Plan: Continue working on nutrition plan to decrease simple carbohydrates, increase lean proteins and exercise to promote weight  loss, improve glycemic control and prevent progression to Type 2 diabetes.  Continue Metformin 500 mg twice daily.   Lab Results  Component Value Date   HGBA1C 5.9 (H) 03/17/2018   HGBA1C 6.2 (H) 12/09/2017   Lab Results  Component Value Date   LDLCALC 162 (H) 03/17/2018   CREATININE 1.14 (H) 10/06/2019     PHYSICAL EXAM:  Blood pressure 118/78, pulse 60, temperature 97.8 F (36.6 C), height '5\' 4"'$  (1.626 m), weight 168 lb 9.6 oz (76.5 kg), SpO2 95 %. Body mass index is 28.94 kg/m.  General: She is overweight, cooperative, alert, well developed, and in no acute distress. PSYCH: Has normal mood, affect and thought process.   HEENT: EOMI, sclerae are anicteric. Lungs: Normal breathing effort, no conversational dyspnea. Extremities: No edema.  Neurologic: No gross sensory or motor deficits. No tremors or fasciculations noted.    DIAGNOSTIC DATA REVIEWED:  BMET    Component Value Date/Time   NA 140 10/06/2019 0736   NA 137 03/17/2018 1031   K 4.3 10/06/2019 0736   CL 108 10/06/2019 0736   CO2 22 10/06/2019 0736   GLUCOSE 181 (H) 10/06/2019 0736   BUN 21 10/06/2019 0736   BUN 22 03/17/2018 1031   CREATININE 1.14 (H) 10/06/2019 0736   CALCIUM 9.3 10/06/2019 0736   GFRNONAA 50 (L) 10/06/2019 0736   GFRAA 58 (L) 10/06/2019 0736   Lab Results  Component Value Date   HGBA1C 5.9 (H) 03/17/2018   HGBA1C 6.2 (H) 12/09/2017   Lab Results  Component Value Date   INSULIN 10.7 05/16/2021   INSULIN 14.7 12/09/2017   Lab Results  Component Value Date   TSH 2.620 05/16/2021   CBC    Component Value Date/Time   WBC 11.7 (H) 10/06/2019 0736   RBC 4.41 10/06/2019 0736   HGB 11.9 (L) 10/06/2019 0736   HGB 12.8 12/09/2017 1407   HCT 38.1 10/06/2019 0736   HCT 39.7 12/09/2017 1407   PLT 339 10/06/2019 0736   MCV 86.4 10/06/2019 0736   MCV 81 12/09/2017 1407   MCH 27.0 10/06/2019 0736   MCHC 31.2 10/06/2019 0736   RDW 13.8 10/06/2019 0736   RDW 16.7 (H) 12/09/2017 1407    Iron Studies No results found for: "IRON", "TIBC", "FERRITIN", "IRONPCTSAT" Lipid Panel     Component Value Date/Time   CHOL 253 (H) 03/17/2018 1031   TRIG 137 03/17/2018 1031   HDL 64 03/17/2018 1031   CHOLHDL 6 09/04/2010 0840   VLDL 26.8 09/04/2010 0840   LDLCALC 162 (H) 03/17/2018 1031   LDLDIRECT 146.2 09/04/2010 0840   Hepatic Function Panel     Component Value Date/Time   PROT 7.3 03/17/2018 1031   ALBUMIN 4.8 03/17/2018 1031   AST 13 03/17/2018 1031   ALT 16 03/17/2018 1031   ALKPHOS 148 (H) 03/17/2018 1031   BILITOT <0.2 03/17/2018 1031      Component Value Date/Time   TSH 2.620 05/16/2021 0957   Nutritional Lab Results  Component Value Date   VD25OH 52.2 03/17/2018   VD25OH 37.6 12/09/2017     Return in about 3 weeks (around 05/29/2022).Marland Kitchen She was informed of the importance of frequent  follow up visits to maximize her success with intensive lifestyle modifications for her multiple health conditions.   ATTESTASTION STATEMENTS:  Reviewed by clinician on day of visit: allergies, medications, problem list, medical history, surgical history, family history, social history, and previous encounter notes.   Time spent on visit including pre-visit chart review and post-visit care and charting was 38 minutes.   Deaja Rizo,PA-C

## 2022-05-23 ENCOUNTER — Encounter (INDEPENDENT_AMBULATORY_CARE_PROVIDER_SITE_OTHER): Payer: Self-pay | Admitting: Internal Medicine

## 2022-05-23 ENCOUNTER — Ambulatory Visit (INDEPENDENT_AMBULATORY_CARE_PROVIDER_SITE_OTHER): Payer: PPO | Admitting: Internal Medicine

## 2022-05-23 VITALS — BP 121/73 | HR 64 | Temp 97.5°F | Ht 64.0 in | Wt 170.0 lb

## 2022-05-23 DIAGNOSIS — E669 Obesity, unspecified: Secondary | ICD-10-CM

## 2022-05-23 DIAGNOSIS — R7303 Prediabetes: Secondary | ICD-10-CM

## 2022-05-23 DIAGNOSIS — L603 Nail dystrophy: Secondary | ICD-10-CM | POA: Diagnosis not present

## 2022-05-23 DIAGNOSIS — Z6829 Body mass index (BMI) 29.0-29.9, adult: Secondary | ICD-10-CM

## 2022-05-23 DIAGNOSIS — F3289 Other specified depressive episodes: Secondary | ICD-10-CM

## 2022-05-23 MED ORDER — CITALOPRAM HYDROBROMIDE 10 MG PO TABS: 10.0000 mg | ORAL_TABLET | Freq: Every day | ORAL | 0 refills | Status: DC

## 2022-05-23 NOTE — Progress Notes (Signed)
Office: [phone]  /  Fax: [phone]  WEIGHT SUMMARY AND BIOMETRICS  Vitals Temp: (!) 97.5 F (36.4 C) BP: 121/73 Pulse Rate: 64 SpO2: 95 %   Anthropometric Measurements Height: '5\' 4"'$  (1.626 m) Weight: 170 lb (77.1 kg) BMI (Calculated): 29.17 Weight at Last Visit: 170lb Weight Lost Since Last Visit: 0 Weight Gained Since Last Visit: 2lb Starting Weight: 190lb Total Weight Loss (lbs): 18 lb (8.165 kg) Peak Weight: 209lb   Body Composition  Body Fat %: 40.6 % Fat Mass (lbs): 69.2 lbs Muscle Mass (lbs): 96 lbs Total Body Water (lbs): 74 lbs Visceral Fat Rating : 11    HPI  Chief Complaint: OBESITY  Wanda Burns is here to discuss her progress with her obesity treatment plan. She is on the the Category 1 Plan and states she is following her eating plan approximately 85 % of the time. She states she is not exercising.  Interval History:  Since last office visit she has remained weight neutral.  She reached a plateau since November of last year.  She has had a few setbacks due to surgery of her foot.  She also acknowledges not getting enough protein with her meals. She reports fair adherence to reduced calorie nutritional plan. She has been working on not skipping meals, eating more fruits, eating more vegetables, drinking more water, and journaling and tracking calories '[x]'$ Denies '[]'$ Reports problems with appetite and hunger signals.  '[x]'$ Denies '[]'$ Reports problems with satiety and satiation.  '[x]'$ Denies '[]'$ Reports problems with eating patterns and portion control.  '[x]'$ Denies '[]'$ Reports abnormal cravings Barriers identified none.   Pharmacotherapy for weight loss: She is currently taking no anti-obesity medication.    ASSESSMENT AND PLAN  TREATMENT PLAN FOR OBESITY:  Recommended Dietary Goals  [name] is currently in the action stage of change. As such, her goal is to continue weight management plan. She has agreed to: the Category 1 Plan.  Behavioral  Intervention  We discussed the following Behavioral Modification Strategies today: increasing lean protein intake, increasing water intake, work on tracking and journaling calories using tracking App, and reading food labels .  Additional resources provided today: Handout on healthy eating and balanced plate and Handout on complex carbohydrates and lean sources of protein  Recommended Physical Activity Goals  [name] has been advised to work up to 150 minutes of moderate intensity aerobic activity a week and strengthening exercises 2-3 times per week for cardiovascular health, weight loss maintenance and preservation of muscle mass.   She has agreed to :  Think about ways to increase physical activity and Increase the intensity, frequency or duration of aerobic exercises    Pharmacotherapy We discussed various medication options to help Refujia with her weight loss efforts and we both agreed to : continue with nutritional and behavioral strategies  ASSOCIATED CONDITIONS ADDRESSED TODAY  Brittle nails Assessment & Plan: She has also been experiencing some hair loss she thinks is medication related.  It may be due to rapid weight loss in the preceding months.  She is now plateaued.  We will check B12 and iron levels as she is on metformin and is also dieting.  I also recommend that she start a multivitamin with minerals.  She is also to use cotton gloves with dishwashing gloves to avoid excess moisture.  Orders: -     TSH + free T4 -     Vitamin B12 -     Iron, TIBC and Ferritin Panel  Pre-diabetes Assessment & Plan: Her most recent A1c was 5.8  in August of last year which is better than previous of 6.2 and 5.9  She will continue with weight loss therapy.  She is also currently on metformin without any adverse effects she will continue medication.   Other depression with emotional eating Assessment & Plan: Stable currently on Celexa and bupropion without any adverse effects.  She will  continue current regimen  Orders: -     Citalopram Hydrobromide; Take 1 tablet (10 mg total) by mouth daily.  Dispense: 90 tablet; Refill: 0     PHYSICAL EXAM:  Blood pressure 121/73, pulse 64, temperature (!) 97.5 F (36.4 C), height '5\' 4"'$  (1.626 m), weight 170 lb (77.1 kg), SpO2 95 %. Body mass index is 29.18 kg/m.  General: She is overweight, cooperative, alert, well developed, and in no acute distress. PSYCH: Has normal mood, affect and thought process.   HEENT: EOMI, sclerae are anicteric. Lungs: Normal breathing effort, no conversational dyspnea. Extremities: No edema.  Neurologic: No gross sensory or motor deficits. No tremors or fasciculations noted.    DIAGNOSTIC DATA REVIEWED:  BMET    Component Value Date/Time   NA 140 10/06/2019 0736   NA 137 03/17/2018 1031   K 4.3 10/06/2019 0736   CL 108 10/06/2019 0736   CO2 22 10/06/2019 0736   GLUCOSE 181 (H) 10/06/2019 0736   BUN 21 10/06/2019 0736   BUN 22 03/17/2018 1031   CREATININE 1.14 (H) 10/06/2019 0736   CALCIUM 9.3 10/06/2019 0736   GFRNONAA 50 (L) 10/06/2019 0736   GFRAA 58 (L) 10/06/2019 0736   Lab Results  Component Value Date   HGBA1C 5.9 (H) 03/17/2018   HGBA1C 6.2 (H) 12/09/2017   Lab Results  Component Value Date   INSULIN 10.7 05/16/2021   INSULIN 14.7 12/09/2017   Lab Results  Component Value Date   TSH 2.620 05/16/2021   CBC    Component Value Date/Time   WBC 11.7 (H) 10/06/2019 0736   RBC 4.41 10/06/2019 0736   HGB 11.9 (L) 10/06/2019 0736   HGB 12.8 12/09/2017 1407   HCT 38.1 10/06/2019 0736   HCT 39.7 12/09/2017 1407   PLT 339 10/06/2019 0736   MCV 86.4 10/06/2019 0736   MCV 81 12/09/2017 1407   MCH 27.0 10/06/2019 0736   MCHC 31.2 10/06/2019 0736   RDW 13.8 10/06/2019 0736   RDW 16.7 (H) 12/09/2017 1407   Iron Studies No results found for: "IRON", "TIBC", "FERRITIN", "IRONPCTSAT" Lipid Panel     Component Value Date/Time   CHOL 253 (H) 03/17/2018 1031   TRIG 137  03/17/2018 1031   HDL 64 03/17/2018 1031   CHOLHDL 6 09/04/2010 0840   VLDL 26.8 09/04/2010 0840   LDLCALC 162 (H) 03/17/2018 1031   LDLDIRECT 146.2 09/04/2010 0840   Hepatic Function Panel     Component Value Date/Time   PROT 7.3 03/17/2018 1031   ALBUMIN 4.8 03/17/2018 1031   AST 13 03/17/2018 1031   ALT 16 03/17/2018 1031   ALKPHOS 148 (H) 03/17/2018 1031   BILITOT <0.2 03/17/2018 1031      Component Value Date/Time   TSH 2.620 05/16/2021 0957   Nutritional Lab Results  Component Value Date   VD25OH 52.2 03/17/2018   VD25OH 37.6 12/09/2017     Return in about 3 weeks (around 06/13/2022) for For Weight Mangement with Dr. Gerarda Fraction.Marland Kitchen She was informed of the importance of frequent follow up visits to maximize her success with intensive lifestyle modifications for her multiple health conditions.  ATTESTASTION STATEMENTS:  Reviewed by clinician on day of visit: allergies, medications, problem list, medical history, surgical history, family history, social history, and previous encounter notes.      Thomes Dinning, MD

## 2022-05-23 NOTE — Assessment & Plan Note (Signed)
She has also been experiencing some hair loss she thinks is medication related.  It may be due to rapid weight loss in the preceding months.  She is now plateaued.  We will check B12 and iron levels as she is on metformin and is also dieting.  I also recommend that she start a multivitamin with minerals.  She is also to use cotton gloves with dishwashing gloves to avoid excess moisture.

## 2022-05-23 NOTE — Assessment & Plan Note (Signed)
Stable currently on Celexa and bupropion without any adverse effects.  She will continue current regimen

## 2022-05-23 NOTE — Assessment & Plan Note (Signed)
Her most recent A1c was 5.8 in August of last year which is better than previous of 6.2 and 5.9  She will continue with weight loss therapy.  She is also currently on metformin without any adverse effects she will continue medication.

## 2022-05-24 LAB — IRON,TIBC AND FERRITIN PANEL
Ferritin: 48 ng/mL (ref 15–150)
Iron Saturation: 21 % (ref 15–55)
Iron: 80 ug/dL (ref 27–139)
Total Iron Binding Capacity: 374 ug/dL (ref 250–450)
UIBC: 294 ug/dL (ref 118–369)

## 2022-05-24 LAB — VITAMIN B12: Vitamin B-12: 439 pg/mL (ref 232–1245)

## 2022-06-06 ENCOUNTER — Ambulatory Visit (INDEPENDENT_AMBULATORY_CARE_PROVIDER_SITE_OTHER): Payer: PPO | Admitting: Internal Medicine

## 2022-06-13 ENCOUNTER — Ambulatory Visit (INDEPENDENT_AMBULATORY_CARE_PROVIDER_SITE_OTHER): Payer: PPO | Admitting: Internal Medicine

## 2022-06-13 ENCOUNTER — Encounter (INDEPENDENT_AMBULATORY_CARE_PROVIDER_SITE_OTHER): Payer: Self-pay | Admitting: Internal Medicine

## 2022-06-13 VITALS — BP 123/77 | HR 60 | Temp 97.6°F | Ht 64.0 in | Wt 170.0 lb

## 2022-06-13 DIAGNOSIS — R7303 Prediabetes: Secondary | ICD-10-CM

## 2022-06-13 DIAGNOSIS — R944 Abnormal results of kidney function studies: Secondary | ICD-10-CM | POA: Diagnosis not present

## 2022-06-13 DIAGNOSIS — L603 Nail dystrophy: Secondary | ICD-10-CM

## 2022-06-13 DIAGNOSIS — E559 Vitamin D deficiency, unspecified: Secondary | ICD-10-CM

## 2022-06-13 DIAGNOSIS — E7800 Pure hypercholesterolemia, unspecified: Secondary | ICD-10-CM

## 2022-06-13 DIAGNOSIS — E6609 Other obesity due to excess calories: Secondary | ICD-10-CM

## 2022-06-13 DIAGNOSIS — E669 Obesity, unspecified: Secondary | ICD-10-CM

## 2022-06-13 DIAGNOSIS — Z6829 Body mass index (BMI) 29.0-29.9, adult: Secondary | ICD-10-CM

## 2022-06-13 NOTE — Progress Notes (Signed)
Office: [phone]  /  Fax: [phone]  WEIGHT SUMMARY AND BIOMETRICS  Vitals Temp: 97.6 F (36.4 C) BP: 123/77 Pulse Rate: 60 SpO2: 98 %   Anthropometric Measurements Height: 5\' 4"  (1.626 m) Weight: 170 lb (77.1 kg) BMI (Calculated): 29.17 Weight at Last Visit: 170 lb Starting Weight: 190 lb Total Weight Loss (lbs): 180 lb (81.6 kg) Peak Weight: 209 lb   Body Composition  Body Fat %: 40.5 % Fat Mass (lbs): 69 lbs Muscle Mass (lbs): 96.4 lbs Total Body Water (lbs): 72.8 lbs Visceral Fat Rating : 11    HPI  Chief Complaint: OBESITY  Wanda Burns is here to discuss her progress with her obesity treatment plan. She is on the the Category 1 Plan and states she is following her eating plan approximately 60% of the time. She states she is exercising 30 minutes 3 times per week.  Interval History:  Since last office visit she has lost 2 pounds. She reports good adherence to reduced calorie nutritional plan. She has been working on reading food labels, not skipping meals, increasing protein at every meal, eating more fruits, eating more vegetables, drinking more water, journaling and tracking calories, and making healthier choices Denies problems with appetite and hunger signals.  Denies problems with satiety and satiation.  Denies problems with eating patterns and portion control.  Denies abnormal cravings. Denies feeling deprived or restricted.   Barriers identified:  Tiredness at the end of the day .   Pharmacotherapy for weight loss: She is currently taking Metformin (off label use for incretin effect and / or insulin resistance and / or diabetes prevention)  and Bupropion (single agent, off label use) .    ASSESSMENT AND PLAN  TREATMENT PLAN FOR OBESITY:  Recommended Dietary Goals  Wanda Burns is currently in the action stage of change. As such, her goal is to continue weight management plan. She has agreed to: continue current plan  Behavioral Intervention  We  discussed the following Behavioral Modification Strategies today: increasing lean protein intake, decreasing simple carbohydrates , increasing vegetables, avoiding skipping meals, increasing water intake, and work on tracking and journaling calories using tracking application.  Additional resources provided today: None  Recommended Physical Activity Goals  Wanda Burns has been advised to work up to 150 minutes of moderate intensity aerobic activity a week and strengthening exercises 2-3 times per week for cardiovascular health, weight loss maintenance and preservation of muscle mass.   She has agreed to :  Start aerobic activity with a goal of 150 minutes a week at moderate intensity.   Pharmacotherapy We discussed various medication options to help Eriqa with her weight loss efforts and we both agreed to : continue current anti-obesity medication regimen  ASSOCIATED CONDITIONS ADDRESSED TODAY  I reviewed [facility] through care everywhere during this encounter.  These are not reflected in the note  Obesity with current BMI of 29  Brittle nails Assessment & Plan: Improved with reduction in weight loss rate.  She is also taking a multivitamin.  B12 and iron studies were reviewed and are normal.   Decreased GFR Assessment & Plan: I reviewed labs done [facility] her GFR is now at 77 and improved.  This is likely as a result of weight loss and improved hydration.  She is also avoiding nephrotoxins.  Continue to monitor   Prediabetes Assessment & Plan: Most recent A1c was 5.7 and improved.  She is currently on metformin twice a day without any adverse effects.  She will  continue medication and weight loss therapy   Pure hypercholesterolemia Assessment & Plan: Her most recent LDL was 120.  She had a high HDL and normal triglycerides.  She is not interested in statin therapy.  She will continue with nutrition and behavioral strategies and maintain a diet low in saturated  fats.   Vitamin D deficiency Assessment & Plan: Most recent vitamin D levels were in the 60s levels are replenished.  She is taking vitamin D 3 2000 units daily without any adverse effects.  She will continue with over-the-counter supplementation      PHYSICAL EXAM:  Blood pressure 123/77, pulse 60, temperature 97.6 F (36.4 C), height 5\' 4"  (1.626 m), weight 170 lb (77.1 kg), SpO2 98 %. Body mass index is 29.18 kg/m.  General: She is overweight, cooperative, alert, well developed, and in no acute distress. PSYCH: Has normal mood, affect and thought process.   HEENT: EOMI, sclerae are anicteric. Lungs: Normal breathing effort, no conversational dyspnea. Extremities: No edema.  Neurologic: No gross sensory or motor deficits. No tremors or fasciculations noted.    DIAGNOSTIC DATA REVIEWED:  BMET    Component Value Date/Time   NA 140 10/06/2019 0736   NA 137 03/17/2018 1031   K 4.3 10/06/2019 0736   CL 108 10/06/2019 0736   CO2 22 10/06/2019 0736   GLUCOSE 181 (H) 10/06/2019 0736   BUN 21 10/06/2019 0736   BUN 22 03/17/2018 1031   CREATININE 1.14 (H) 10/06/2019 0736   CALCIUM 9.3 10/06/2019 0736   GFRNONAA 50 (L) 10/06/2019 0736   GFRAA 58 (L) 10/06/2019 0736   Lab Results  Component Value Date   HGBA1C 5.9 (H) 03/17/2018   HGBA1C 6.2 (H) 12/09/2017   Lab Results  Component Value Date   INSULIN 10.7 05/16/2021   INSULIN 14.7 12/09/2017   Lab Results  Component Value Date   TSH 2.620 05/16/2021   CBC    Component Value Date/Time   WBC 11.7 (H) 10/06/2019 0736   RBC 4.41 10/06/2019 0736   HGB 11.9 (L) 10/06/2019 0736   HGB 12.8 12/09/2017 1407   HCT 38.1 10/06/2019 0736   HCT 39.7 12/09/2017 1407   PLT 339 10/06/2019 0736   MCV 86.4 10/06/2019 0736   MCV 81 12/09/2017 1407   MCH 27.0 10/06/2019 0736   MCHC 31.2 10/06/2019 0736   RDW 13.8 10/06/2019 0736   RDW 16.7 (H) 12/09/2017 1407   Iron Studies    Component Value Date/Time   IRON 80  05/23/2022 1540   TIBC 374 05/23/2022 1540   FERRITIN 48 05/23/2022 1540   IRONPCTSAT 21 05/23/2022 1540   Lipid Panel     Component Value Date/Time   CHOL 253 (H) 03/17/2018 1031   TRIG 137 03/17/2018 1031   HDL 64 03/17/2018 1031   CHOLHDL 6 09/04/2010 0840   VLDL 26.8 09/04/2010 0840   LDLCALC 162 (H) 03/17/2018 1031   LDLDIRECT 146.2 09/04/2010 0840   Hepatic Function Panel     Component Value Date/Time   PROT 7.3 03/17/2018 1031   ALBUMIN 4.8 03/17/2018 1031   AST 13 03/17/2018 1031   ALT 16 03/17/2018 1031   ALKPHOS 148 (H) 03/17/2018 1031   BILITOT <0.2 03/17/2018 1031      Component Value Date/Time   TSH 2.620 05/16/2021 0957   Nutritional Lab Results  Component Value Date   VD25OH 52.2 03/17/2018   VD25OH 37.6 12/09/2017     Return in about 3 weeks (around 07/04/2022) for For  Weight Mangement with Dr. Gerarda Fraction.Marland Kitchen She was informed of the importance of frequent follow up visits to maximize her success with intensive lifestyle modifications for her multiple health conditions.   ATTESTASTION STATEMENTS:  Reviewed by clinician on day of visit: allergies, medications, problem list, medical history, surgical history, family history, social history, and previous encounter notes.     Thomes Dinning, MD

## 2022-06-13 NOTE — Assessment & Plan Note (Signed)
Her most recent LDL was 120.  She had a high HDL and normal triglycerides.  She is not interested in statin therapy.  She will continue with nutrition and behavioral strategies and maintain a diet low in saturated fats.

## 2022-06-13 NOTE — Assessment & Plan Note (Signed)
I reviewed labs done at [facility] her GFR is now at 77 and improved.  This is likely as a result of weight loss and improved hydration.  She is also avoiding nephrotoxins.  Continue to monitor

## 2022-06-13 NOTE — Assessment & Plan Note (Signed)
Most recent vitamin D levels were in the 60s levels are replenished.  She is taking vitamin D 3 2000 units daily without any adverse effects.  She will continue with over-the-counter supplementation

## 2022-06-13 NOTE — Assessment & Plan Note (Signed)
Improved with reduction in weight loss rate.  She is also taking a multivitamin.  B12 and iron studies were reviewed and are normal.

## 2022-06-13 NOTE — Assessment & Plan Note (Signed)
Most recent A1c was 5.7 and improved.  She is currently on metformin twice a day without any adverse effects.  She will continue medication and weight loss therapy

## 2022-06-13 NOTE — Progress Notes (Deleted)
Office: [phone]  /  Fax: [phone]  WEIGHT SUMMARY AND BIOMETRICS  Vitals Temp: 97.6 F (36.4 C) BP: 123/77 Pulse Rate: 60 SpO2: 98 %   Anthropometric Measurements Height: 5\' 4"  (1.626 m) Weight: 170 lb (77.1 kg) BMI (Calculated): 29.17 Weight at Last Visit: 170 lb Starting Weight: 190 lb Total Weight Loss (lbs): 180 lb (81.6 kg) Peak Weight: 209 lb   Body Composition  Body Fat %: 40.5 % Fat Mass (lbs): 69 lbs Muscle Mass (lbs): 96.4 lbs Total Body Water (lbs): 72.8 lbs Visceral Fat Rating : 11    HPI  Chief Complaint: OBESITY  Wanda Burns is here to discuss her progress with her obesity treatment plan. She is on the the Category 1 Plan and states she is following her eating plan approximately 90 % of the time. She states she is exercising 20-3-4    WEIGHT SUMMARY AND BIOMETRICS  Vitals Temp: 97.6 F (36.4 C) BP: 123/77 Pulse Rate: 60 SpO2: 98 %   Anthropometric Measurements Height: 5\' 4"  (1.626 m) Weight: 170 lb (77.1 kg) BMI (Calculated): 29.17 Weight at Last Visit: 170 lb Starting Weight: 190 lb Total Weight Loss (lbs): 180 lb (81.6 kg) Peak Weight: 209 lb   Body Composition  Body Fat %: 40.5 % Fat Mass (lbs): 69 lbs Muscle Mass (lbs): 96.4 lbs Total Body Water (lbs): 72.8 lbs Visceral Fat Rating : 11   Other Clinical Data Fasting: No Labs: No Today's Visit #: 16 Starting Date: 05/16/21    Chief Complaint:   OBESITY Wanda Burns is here to discuss her progress with her obesity treatment plan. She is on the {MWMwtlossportion/plan2:23431} and states she is following her eating plan approximately *** % of the time. She states she is exercising *** minutes *** times per week.   Interim History: ***  Subjective:   There are no diagnoses linked to this encounter.  Assessment/Plan:   There are no diagnoses linked to this encounter. Wanda Burns {CHL AMB IS/IS [id number] currently in the action stage of change. As such, her goal is to  {MWMwtloss#1:210800005}. She has agreed to {MWMwtlossportion/plan2:23431}.   Exercise goals: {MWM EXERCISE RECS:23473}  Behavioral modification strategies: {MWMwtlossdietstrategies3:23432}.  Wanda Burns has agreed to follow-up with our clinic in {NUMBER 1-10:22536} weeks. She was informed of the importance of frequent follow-up visits to maximize her success with intensive lifestyle modifications for her multiple health conditions.   ***delete paragraph if no labs orderedLaura was informed we would discuss her lab results at her next visit unless there is a critical issue that needs to be addressed sooner. Wanda Burns agreed to keep her next visit at the agreed upon time to discuss these results.  Objective:   Blood pressure 123/77, pulse 60, temperature 97.6 F (36.4 C), height 5\' 4"  (1.626 m), weight 170 lb (77.1 kg), SpO2 98 %. Body mass index is 29.18 kg/m.  General: Cooperative, alert, well developed, in no acute distress. HEENT: Conjunctivae and lids unremarkable. Cardiovascular: Regular rhythm.  Lungs: Normal work of breathing. Neurologic: No focal deficits.   Lab Results  Component Value Date   CREATININE 1.14 (H) 10/06/2019   BUN 21 10/06/2019   NA 140 10/06/2019   K 4.3 10/06/2019   CL 108 10/06/2019   CO2 22 10/06/2019   Lab Results  Component Value Date   ALT 16 03/17/2018   AST 13 03/17/2018   ALKPHOS 148 (H) 03/17/2018   BILITOT <0.2 03/17/2018   Lab Results  Component Value Date   HGBA1C 5.9 (H) 03/17/2018  HGBA1C 6.2 (H) 12/09/2017   Lab Results  Component Value Date   INSULIN 10.7 05/16/2021   INSULIN 13.4 03/17/2018   INSULIN 14.7 12/09/2017   Lab Results  Component Value Date   TSH 2.620 05/16/2021   Lab Results  Component Value Date   CHOL 253 (H) 03/17/2018   HDL 64 03/17/2018   LDLCALC 162 (H) 03/17/2018   LDLDIRECT 146.2 09/04/2010   TRIG 137 03/17/2018   CHOLHDL 6 09/04/2010   Lab Results  Component Value Date   VD25OH 52.2 03/17/2018    VD25OH 37.6 12/09/2017   Lab Results  Component Value Date   WBC 11.7 (H) 10/06/2019   HGB 11.9 (L) 10/06/2019   HCT 38.1 10/06/2019   MCV 86.4 10/06/2019   PLT 339 10/06/2019   Lab Results  Component Value Date   IRON 80 05/23/2022   TIBC 374 05/23/2022   FERRITIN 48 05/23/2022    Obesity Behavioral Intervention:   Approximately 15 minutes were spent on the discussion below.  ASK: We discussed the diagnosis of obesity with Wanda Burns today and Wanda Burns agreed to give [place] permission to discuss obesity behavioral modification therapy today.  ASSESS: Wanda Burns has the diagnosis of obesity and her BMI today is ***. Evaleigh {ACTION; IS/IS [id number] in the action stage of change.   ADVISE: Wanda Burns was educated on the multiple health risks of obesity as well as the benefit of weight loss to improve her health. She was advised of the need for long term treatment and the importance of lifestyle modifications to improve her current health and to decrease her risk of future health problems.  AGREE: Multiple dietary modification options and treatment options were discussed and Wanda Burns agreed to follow the recommendations documented in the above note.  ARRANGE: Wanda Burns was educated on the importance of frequent visits to treat obesity as outlined per CMS and USPSTF guidelines and agreed to schedule her next follow up appointment today.  Attestation Statements:   Reviewed by clinician on day of visit: allergies, medications, problem list, medical history, surgical history, family history, social history, and previous encounter notes.  ***(delete if time-based billing not used)Time spent on visit including pre-visit chart review and post-visit care and charting was *** minutes.   I, ***, am acting as transcriptionist for ***.  I have reviewed the above documentation for accuracy and completeness, and I agree with the above. -  *** minutes *** times per week.  Interval History:  Since last office  visit she has ***. She reports {EMADHERENCE:28838::"good adherence to reduced calorie nutritional plan."} She has been working on {EMWORKINGON:29242} {ACTIONS;DENIES/REPORTS:21021675::"Denies"} problems with appetite and hunger signals.  {ACTIONS;DENIES/REPORTS:21021675::"Denies"} problems with satiety and satiation.  {ACTIONS;DENIES/REPORTS:21021675::"Denies"} problems with eating patterns and portion control.  {ACTIONS;DENIES/REPORTS:21021675::"Denies"} abnormal cravings. {ACTIONS;DENIES/REPORTS:21021675::"Denies"} feeling deprived or restricted.   Barriers identified: {EMOBESITYBARRIERS:28841::"none"}.   Pharmacotherapy for weight loss: She is currently taking {EMPharmaco:28845::"no anti-obesity medication"}.    ASSESSMENT AND PLAN  TREATMENT PLAN FOR OBESITY:  Recommended Dietary Goals  Wanda Burns is currently in the action stage of change. As such, her goal is to continue weight management plan. She has agreed to: {EMWTLOSSPLAN:29297::"continue current plan"}  Behavioral Intervention  We discussed the following Behavioral Modification Strategies today: {EMWMwtlossstrategies:28914}.  Additional resources provided today: {EMadditionalresources:29169::"None"}  Recommended Physical Activity Goals  Wanda Burns has been advised to work up to 150 minutes of moderate intensity aerobic activity a week and strengthening exercises 2-3 times per week for cardiovascular health, weight loss maintenance and preservation of muscle mass.   She has  agreed to :  {EMEXERCISE:28847::"Continue current level of physical activity "}  Pharmacotherapy We discussed various medication options to help Wanda Burns with her weight loss efforts and we both agreed to : {EMagreedrx:29170::"continue with nutritional and behavioral strategies"}  ASSOCIATED CONDITIONS ADDRESSED TODAY  Obesity with current BMI of 29  Wanda Burns  Decreased GFR  Wanda Burns  Wanda Burns  Wanda Burns      PHYSICAL EXAM:  Blood pressure 123/77, pulse 60, temperature 97.6 F (36.4 C), height 5\' 4"  (1.626 m), weight 170 lb (77.1 kg), SpO2 98 %. Body mass index is 29.18 kg/m.  General: She is overweight, cooperative, alert, well developed, and in no acute distress. PSYCH: Has normal mood, affect and thought process.   HEENT: EOMI, sclerae are anicteric. Lungs: Normal breathing effort, no conversational dyspnea. Extremities: No edema.  Neurologic: No gross sensory or motor deficits. No tremors or fasciculations noted.    DIAGNOSTIC DATA REVIEWED:  BMET    Component Value Date/Time   NA 140 10/06/2019 0736   NA 137 03/17/2018 1031   K 4.3 10/06/2019 0736   CL 108 10/06/2019 0736   CO2 22 10/06/2019 0736   GLUCOSE 181 (H) 10/06/2019 0736   BUN 21 10/06/2019 0736   BUN 22 03/17/2018 1031   CREATININE 1.14 (H) 10/06/2019 0736   CALCIUM 9.3 10/06/2019 0736   GFRNONAA 50 (L) 10/06/2019 0736   GFRAA 58 (L) 10/06/2019 0736   Lab Results  Component Value Date   HGBA1C 5.9 (H) 03/17/2018   HGBA1C 6.2 (H) 12/09/2017   Lab Results  Component Value Date   INSULIN 10.7 05/16/2021   INSULIN 14.7 12/09/2017   Lab Results  Component Value Date   TSH 2.620 05/16/2021   CBC    Component Value Date/Time   WBC 11.7 (H) 10/06/2019 0736   RBC 4.41 10/06/2019 0736   HGB 11.9 (L) 10/06/2019 0736   HGB 12.8 12/09/2017 1407   HCT 38.1 10/06/2019 0736   HCT 39.7 12/09/2017 1407   PLT 339 10/06/2019 0736   MCV 86.4 10/06/2019 0736   MCV 81 12/09/2017 1407   MCH 27.0 10/06/2019 0736   MCHC 31.2 10/06/2019 0736   RDW 13.8 10/06/2019 0736   RDW 16.7 (H) 12/09/2017 1407   Iron Studies    Component Value Date/Time   IRON 80 05/23/2022 1540   TIBC 374 05/23/2022 1540   FERRITIN 48 05/23/2022 1540   IRONPCTSAT 21 05/23/2022 1540   Lipid Panel     Component Value Date/Time   CHOL 253 (H) 03/17/2018 1031   TRIG 137 03/17/2018 1031   HDL 64 03/17/2018 1031   CHOLHDL 6 09/04/2010  0840   VLDL 26.8 09/04/2010 0840   LDLCALC 162 (H) 03/17/2018 1031   LDLDIRECT 146.2 09/04/2010 0840   Hepatic Function Panel     Component Value Date/Time   PROT 7.3 03/17/2018 1031   ALBUMIN 4.8 03/17/2018 1031   AST 13 03/17/2018 1031   ALT 16 03/17/2018 1031   ALKPHOS 148 (H) 03/17/2018 1031   BILITOT <0.2 03/17/2018 1031      Component Value Date/Time   TSH 2.620 05/16/2021 0957   Nutritional Lab Results  Component Value Date   VD25OH 52.2 03/17/2018   VD25OH 37.6 12/09/2017     Return in about 3 weeks (around 07/04/2022) for For Weight Mangement with Dr. Gerarda Fraction.Marland Kitchen She was informed of the importance of frequent follow up visits to maximize her success with intensive lifestyle modifications for her multiple health conditions.  ATTESTASTION STATEMENTS:  Reviewed by clinician on day of visit: allergies, medications, problem list, medical history, surgical history, family history, social history, and previous encounter notes.     Thomes Dinning, MD

## 2022-07-11 ENCOUNTER — Ambulatory Visit (INDEPENDENT_AMBULATORY_CARE_PROVIDER_SITE_OTHER): Payer: PPO | Admitting: Internal Medicine

## 2022-07-11 ENCOUNTER — Encounter (INDEPENDENT_AMBULATORY_CARE_PROVIDER_SITE_OTHER): Payer: Self-pay | Admitting: Internal Medicine

## 2022-07-11 VITALS — BP 122/73 | HR 61 | Temp 97.9°F | Ht 64.0 in | Wt 174.0 lb

## 2022-07-11 DIAGNOSIS — E669 Obesity, unspecified: Secondary | ICD-10-CM

## 2022-07-11 DIAGNOSIS — Z6829 Body mass index (BMI) 29.0-29.9, adult: Secondary | ICD-10-CM | POA: Diagnosis not present

## 2022-07-11 DIAGNOSIS — E6609 Other obesity due to excess calories: Secondary | ICD-10-CM

## 2022-07-11 DIAGNOSIS — F3289 Other specified depressive episodes: Secondary | ICD-10-CM | POA: Diagnosis not present

## 2022-07-11 MED ORDER — CITALOPRAM HYDROBROMIDE 10 MG PO TABS: 10.0000 mg | ORAL_TABLET | Freq: Every day | ORAL | 0 refills | Status: DC

## 2022-07-11 NOTE — Progress Notes (Signed)
Office: [phone]  /  Fax: [phone]  WEIGHT SUMMARY AND BIOMETRICS  Vitals Temp: 97.9 F (36.6 C) BP: 122/73 Pulse Rate: 61 SpO2: 99 %   Anthropometric Measurements Height: 5\' 4"  (1.626 m) Weight: 174 lb (78.9 kg) BMI (Calculated): 29.85 Weight at Last Visit: 170 lb Weight Gained Since Last Visit: 4 lb Starting Weight: 190 lb Total Weight Loss (lbs): 14 lb (6.35 kg) Peak Weight: 209 lb   Body Composition  Body Fat %: 41.7 % Fat Mass (lbs): 72.6 lbs Muscle Mass (lbs): 96.4 lbs Total Body Water (lbs): 76.8 lbs Visceral Fat Rating : 11    No data recorded Today's Visit #: 17  Starting Date: 05/16/21   HPI  Chief Complaint: OBESITY  Wanda Burns is here to discuss her progress with her obesity treatment plan. She is on the the Category 1 Plan and states she is following her eating plan approximately 90 % of the time.   Interval History:  Since last office visit she has gained 4 pounds. She reports fair adherence to reduced calorie nutritional plan.  She has been somewhat stressed recently and has been eating some comfort foods.  She also has not been tracking and journaling as she was before. Reports problems with appetite and hunger signals.  Denies problems with satiety and satiation.  Denies problems with eating patterns and portion control.  Reports abnormal cravings. Denies feeling deprived or restricted.   Barriers identified:  Dieting fatigue and stress .   Pharmacotherapy for weight loss: She is currently taking no anti-obesity medication.    ASSESSMENT AND PLAN  TREATMENT PLAN FOR OBESITY:  Recommended Dietary Goals  Wanda Burns is currently in the action stage of change. As such, her goal is to continue weight management plan. She has agreed to: continue current plan  Behavioral Intervention  We discussed the following Behavioral Modification Strategies today: increasing lean protein intake, decreasing simple carbohydrates , increasing  vegetables, increasing lower glycemic fruits, increasing water intake, continue to practice mindfulness when eating, and planning for success.  We discussed easing off tracking and journaling I think she is somewhat burned out and to focus more on balanced eating and eating whole foods.  She will work on this strategy for the next 2 weeks and see how she does.  I also do not want her to feel deprived or restricted she will practice mindfulness about foods that she may enjoy or try to find healthier substitutions for these.  Additional resources provided today: None  Recommended Physical Activity Goals  Wanda Burns has been advised to work up to 150 minutes of moderate intensity aerobic activity a week and strengthening exercises 2-3 times per week for cardiovascular health, weight loss maintenance and preservation of muscle mass.   She has agreed to :  Think about ways to increase physical activity  Pharmacotherapy We discussed various medication options to help Wanda Burns with her weight loss efforts and we both agreed to : continue with nutritional and behavioral strategies  ASSOCIATED CONDITIONS ADDRESSED TODAY  Obesity with current BMI of 29  Other depression with emotional eating Assessment & Plan: Stable currently on Celexa and bupropion without any adverse effects.  She will continue current regimen.  This regimen precedes me, was started by a colleague..  We will complete PHQ-9 at the next office visit  Orders: -     Citalopram Hydrobromide; Take 1 tablet (10 mg total) by mouth daily.  Dispense: 90 tablet; Refill: 0    PHYSICAL EXAM:  Blood pressure 122/73,  pulse 61, temperature 97.9 F (36.6 C), height 5\' 4"  (1.626 m), weight 174 lb (78.9 kg), SpO2 99 %. Body mass index is 29.87 kg/m.  General: She is overweight, cooperative, alert, well developed, and in no acute distress. PSYCH: Has normal mood, affect and thought process.   HEENT: EOMI, sclerae are anicteric. Lungs: Normal  breathing effort, no conversational dyspnea. Extremities: No edema.  Neurologic: No gross sensory or motor deficits. No tremors or fasciculations noted.    DIAGNOSTIC DATA REVIEWED:  BMET    Component Value Date/Time   NA 140 10/06/2019 0736   NA 137 03/17/2018 1031   K 4.3 10/06/2019 0736   CL 108 10/06/2019 0736   CO2 22 10/06/2019 0736   GLUCOSE 181 (H) 10/06/2019 0736   BUN 21 10/06/2019 0736   BUN 22 03/17/2018 1031   CREATININE 1.14 (H) 10/06/2019 0736   CALCIUM 9.3 10/06/2019 0736   GFRNONAA 50 (L) 10/06/2019 0736   GFRAA 58 (L) 10/06/2019 0736   Lab Results  Component Value Date   HGBA1C 5.9 (H) 03/17/2018   HGBA1C 6.2 (H) 12/09/2017   Lab Results  Component Value Date   INSULIN 10.7 05/16/2021   INSULIN 14.7 12/09/2017   Lab Results  Component Value Date   TSH 2.620 05/16/2021   CBC    Component Value Date/Time   WBC 11.7 (H) 10/06/2019 0736   RBC 4.41 10/06/2019 0736   HGB 11.9 (L) 10/06/2019 0736   HGB 12.8 12/09/2017 1407   HCT 38.1 10/06/2019 0736   HCT 39.7 12/09/2017 1407   PLT 339 10/06/2019 0736   MCV 86.4 10/06/2019 0736   MCV 81 12/09/2017 1407   MCH 27.0 10/06/2019 0736   MCHC 31.2 10/06/2019 0736   RDW 13.8 10/06/2019 0736   RDW 16.7 (H) 12/09/2017 1407   Iron Studies    Component Value Date/Time   IRON 80 05/23/2022 1540   TIBC 374 05/23/2022 1540   FERRITIN 48 05/23/2022 1540   IRONPCTSAT 21 05/23/2022 1540   Lipid Panel     Component Value Date/Time   CHOL 253 (H) 03/17/2018 1031   TRIG 137 03/17/2018 1031   HDL 64 03/17/2018 1031   CHOLHDL 6 09/04/2010 0840   VLDL 26.8 09/04/2010 0840   LDLCALC 162 (H) 03/17/2018 1031   LDLDIRECT 146.2 09/04/2010 0840   Hepatic Function Panel     Component Value Date/Time   PROT 7.3 03/17/2018 1031   ALBUMIN 4.8 03/17/2018 1031   AST 13 03/17/2018 1031   ALT 16 03/17/2018 1031   ALKPHOS 148 (H) 03/17/2018 1031   BILITOT <0.2 03/17/2018 1031      Component Value Date/Time    TSH 2.620 05/16/2021 0957   Nutritional Lab Results  Component Value Date   VD25OH 52.2 03/17/2018   VD25OH 37.6 12/09/2017     Return in about 2 weeks (around 07/25/2022) for For Weight Mangement with Dr. Rikki Spearing.Marland Kitchen She was informed of the importance of frequent follow up visits to maximize her success with intensive lifestyle modifications for her multiple health conditions.   ATTESTASTION STATEMENTS:  Reviewed by clinician on day of visit: allergies, medications, problem list, medical history, surgical history, family history, social history, and previous encounter notes.     Worthy Rancher, MD

## 2022-07-11 NOTE — Assessment & Plan Note (Signed)
Stable currently on Celexa and bupropion without any adverse effects.  She will continue current regimen.  This regimen precedes me, was started by a colleague..  We will complete PHQ-9 at the next office visit

## 2022-08-08 ENCOUNTER — Ambulatory Visit (INDEPENDENT_AMBULATORY_CARE_PROVIDER_SITE_OTHER): Payer: PPO | Admitting: Internal Medicine

## 2022-08-08 ENCOUNTER — Encounter (INDEPENDENT_AMBULATORY_CARE_PROVIDER_SITE_OTHER): Payer: Self-pay | Admitting: Internal Medicine

## 2022-08-08 VITALS — BP 111/70 | HR 61 | Temp 97.9°F | Ht 64.0 in | Wt 168.0 lb

## 2022-08-08 DIAGNOSIS — E559 Vitamin D deficiency, unspecified: Secondary | ICD-10-CM | POA: Diagnosis not present

## 2022-08-08 DIAGNOSIS — R944 Abnormal results of kidney function studies: Secondary | ICD-10-CM

## 2022-08-08 DIAGNOSIS — R7303 Prediabetes: Secondary | ICD-10-CM

## 2022-08-08 DIAGNOSIS — E669 Obesity, unspecified: Secondary | ICD-10-CM

## 2022-08-08 DIAGNOSIS — Z6828 Body mass index (BMI) 28.0-28.9, adult: Secondary | ICD-10-CM

## 2022-08-08 DIAGNOSIS — E7800 Pure hypercholesterolemia, unspecified: Secondary | ICD-10-CM

## 2022-08-08 DIAGNOSIS — E6609 Other obesity due to excess calories: Secondary | ICD-10-CM

## 2022-08-08 NOTE — Assessment & Plan Note (Signed)
Patient has lost 15% of baseline body weight.  Her body fat percentage is also down to 38% goal is less than 34%.  She has done a good job with muscle mass preservation.  She is on metformin for incretin effect and diabetes prevention.  She is also on Wellbutrin and citalopram for emotional eating patterns.  She is not having any side effects and will continue medication.

## 2022-08-08 NOTE — Assessment & Plan Note (Signed)
Most recent vitamin D levels were in the 60s levels are replenished.  She is taking vitamin D 3 2000 units daily without any adverse effects.  She will continue with over-the-counter supplementation 

## 2022-08-08 NOTE — Progress Notes (Signed)
Office: [phone]  /  Fax: [phone]  WEIGHT SUMMARY AND BIOMETRICS  Vitals Temp: 97.9 F (36.6 C) BP: 111/70 Pulse Rate: 61 SpO2: 97 %   Anthropometric Measurements Height: 5\' 4"  (1.626 m) Weight: 168 lb (76.2 kg) BMI (Calculated): 28.82 Weight at Last Visit: 174lb Weight Lost Since Last Visit: 6lb Weight Gained Since Last Visit: 0 Starting Weight: 190lb Total Weight Loss (lbs): 20 lb (9.072 kg) Peak Weight: 209lb   Body Composition  Body Fat %: 38.8 % Fat Mass (lbs): 65.4 lbs Muscle Mass (lbs): 97.8 lbs Total Body Water (lbs): 70.4 lbs Visceral Fat Rating : 11    No data recorded Today's Visit #: 18  Starting Date: 05/16/21   HPI  Chief Complaint: OBESITY  Wanda Burns is here to discuss her progress with her obesity treatment plan. She is on the the Category 1 Plan and states she is following her eating plan approximately 90 % of the time. She states she is exercising by walking  30 to 40 minutes 2 times per week.  Interval History:  Since last office visit she has lost 6 lbs. Started PT, they will be working with her to improve balance and left ankle weakness.[email or web address] She reports good adherence to reduced calorie nutritional plan. She has been working on not skipping meals, increasing protein intake at every meal, eating more vegetables, and drinking more water Denies problems with appetite and hunger signals.  Denies problems with satiety and satiation.  Denies problems with eating patterns and portion control.  Reports abnormal cravings for junk food Denies feeling deprived or restricted.   Barriers identified: orthopedic problems, medical conditions or chronic pain affecting mobility.   Pharmacotherapy for weight loss: She is currently taking Bupropion (single agent, off label use)  and citalopram for emotional eating .    ASSESSMENT AND PLAN  TREATMENT PLAN FOR OBESITY:  Recommended Dietary Goals  Wanda Burns is currently in the action stage of  change. As such, her goal is to continue weight management plan. She has agreed to: continue current plan  Behavioral Intervention  We discussed the following Behavioral Modification Strategies today: increasing lean protein intake, decreasing simple carbohydrates , increasing vegetables, increasing lower glycemic fruits, increasing fiber rich foods, avoiding skipping meals, increasing water intake, and planning for success.  Additional resources provided today: None  Recommended Physical Activity Goals  Wanda Burns has been advised to work up to 150 minutes of moderate intensity aerobic activity a week and strengthening exercises 2-3 times per week for cardiovascular health, weight loss maintenance and preservation of muscle mass.   She has agreed to :  Think about ways to increase daily physical activity and overcoming barriers to exercise and continue PT  Pharmacotherapy We discussed various medication options to help Wanda Burns with her weight loss efforts and we both agreed to :  continue psychotropic medication.   ASSOCIATED CONDITIONS ADDRESSED TODAY  Prediabetes Assessment & Plan: Asymptomatic.  Most recent A1c was 5.7 and improved.  She has lost 15% of baseline body weight.  She is currently on metformin twice a day without any adverse effects.  She will continue medication and lifestyle changes..     Pure hypercholesterolemia Assessment & Plan: Her most recent LDL was 120.  She had a high HDL and normal triglycerides. The 10-year ASCVD risk score (Arnett DK, et al., 2019) is: 6.3%  She is not interested in statin therapy.  She will continue with nutrition and behavioral strategies and maintain a diet low in saturated fats.  Vitamin D deficiency Assessment & Plan: Most recent vitamin D levels were in the 60s levels are replenished.  She is taking vitamin D 3 2000 units daily without any adverse effects.  She will continue with over-the-counter supplementation   Decreased  GFR  Obesity with current BMI of 28 Assessment & Plan: Patient has lost 15% of baseline body weight.  Her body fat percentage is also down to 38% goal is less than 34%.  She has done a good job with muscle mass preservation.  She is on metformin for incretin effect and diabetes prevention.  She is also on Wellbutrin and citalopram for emotional eating patterns.  She is not having any side effects and will continue medication.     PHYSICAL EXAM:  Blood pressure 111/70, pulse 61, temperature 97.9 F (36.6 C), height 5\' 4"  (1.626 m), weight 168 lb (76.2 kg), SpO2 97 %. Body mass index is 28.84 kg/m.  General: She is overweight, cooperative, alert, well developed, and in no acute distress. PSYCH: Has normal mood, affect and thought process.   HEENT: EOMI, sclerae are anicteric. Lungs: Normal breathing effort, no conversational dyspnea. Extremities: No edema.  Neurologic: No gross sensory or motor deficits. No tremors or fasciculations noted.    DIAGNOSTIC DATA REVIEWED:  BMET    Component Value Date/Time   NA 140 10/06/2019 0736   NA 137 03/17/2018 1031   K 4.3 10/06/2019 0736   CL 108 10/06/2019 0736   CO2 22 10/06/2019 0736   GLUCOSE 181 (H) 10/06/2019 0736   BUN 21 10/06/2019 0736   BUN 22 03/17/2018 1031   CREATININE 1.14 (H) 10/06/2019 0736   CALCIUM 9.3 10/06/2019 0736   GFRNONAA 50 (L) 10/06/2019 0736   GFRAA 58 (L) 10/06/2019 0736   Lab Results  Component Value Date   HGBA1C 5.9 (H) 03/17/2018   HGBA1C 6.2 (H) 12/09/2017   Lab Results  Component Value Date   INSULIN 10.7 05/16/2021   INSULIN 14.7 12/09/2017   Lab Results  Component Value Date   TSH 2.620 05/16/2021   CBC    Component Value Date/Time   WBC 11.7 (H) 10/06/2019 0736   RBC 4.41 10/06/2019 0736   HGB 11.9 (L) 10/06/2019 0736   HGB 12.8 12/09/2017 1407   HCT 38.1 10/06/2019 0736   HCT 39.7 12/09/2017 1407   PLT 339 10/06/2019 0736   MCV 86.4 10/06/2019 0736   MCV 81 12/09/2017 1407    MCH 27.0 10/06/2019 0736   MCHC 31.2 10/06/2019 0736   RDW 13.8 10/06/2019 0736   RDW 16.7 (H) 12/09/2017 1407   Iron Studies    Component Value Date/Time   IRON 80 05/23/2022 1540   TIBC 374 05/23/2022 1540   FERRITIN 48 05/23/2022 1540   IRONPCTSAT 21 05/23/2022 1540   Lipid Panel     Component Value Date/Time   CHOL 253 (H) 03/17/2018 1031   TRIG 137 03/17/2018 1031   HDL 64 03/17/2018 1031   CHOLHDL 6 09/04/2010 0840   VLDL 26.8 09/04/2010 0840   LDLCALC 162 (H) 03/17/2018 1031   LDLDIRECT 146.2 09/04/2010 0840   Hepatic Function Panel     Component Value Date/Time   PROT 7.3 03/17/2018 1031   ALBUMIN 4.8 03/17/2018 1031   AST 13 03/17/2018 1031   ALT 16 03/17/2018 1031   ALKPHOS 148 (H) 03/17/2018 1031   BILITOT <0.2 03/17/2018 1031      Component Value Date/Time   TSH 2.620 05/16/2021 0957   Nutritional Lab Results  Component Value Date   VD25OH 52.2 03/17/2018   VD25OH 37.6 12/09/2017     Return in about 2 weeks (around 08/22/2022) for For Weight Mangement with Dr. Rikki Spearing.Marland Kitchen She was informed of the importance of frequent follow up visits to maximize her success with intensive lifestyle modifications for her multiple health conditions.   ATTESTASTION STATEMENTS:  Reviewed by clinician on day of visit: allergies, medications, problem list, medical history, surgical history, family history, social history, and previous encounter notes.     Worthy Rancher, MD

## 2022-08-08 NOTE — Assessment & Plan Note (Addendum)
Asymptomatic.  Most recent A1c was 5.7 and improved.  She has lost 15% of baseline body weight.  She is currently on metformin twice a day without any adverse effects.  She will continue medication and lifestyle changes.Marland Kitchen

## 2022-08-08 NOTE — Assessment & Plan Note (Addendum)
Her most recent LDL was 120.  She had a high HDL and normal triglycerides. The 10-year ASCVD risk score (Arnett DK, et al., 2019) is: 6.3%  She is not interested in statin therapy.  She will continue with nutrition and behavioral strategies and maintain a diet low in saturated fats.

## 2022-08-28 ENCOUNTER — Encounter (INDEPENDENT_AMBULATORY_CARE_PROVIDER_SITE_OTHER): Payer: Self-pay | Admitting: Internal Medicine

## 2022-08-28 ENCOUNTER — Ambulatory Visit (INDEPENDENT_AMBULATORY_CARE_PROVIDER_SITE_OTHER): Payer: PPO | Admitting: Internal Medicine

## 2022-08-28 VITALS — BP 117/72 | HR 68 | Temp 98.4°F | Ht 64.0 in | Wt 171.6 lb

## 2022-08-28 DIAGNOSIS — Z6829 Body mass index (BMI) 29.0-29.9, adult: Secondary | ICD-10-CM

## 2022-08-28 DIAGNOSIS — F3289 Other specified depressive episodes: Secondary | ICD-10-CM

## 2022-08-28 DIAGNOSIS — R7303 Prediabetes: Secondary | ICD-10-CM

## 2022-08-28 DIAGNOSIS — E6609 Other obesity due to excess calories: Secondary | ICD-10-CM | POA: Diagnosis not present

## 2022-08-28 MED ORDER — TOPIRAMATE 25 MG PO TABS: 25.0000 mg | ORAL_TABLET | Freq: Every day | ORAL | 0 refills | Status: DC

## 2022-08-28 NOTE — Assessment & Plan Note (Signed)
See obesity plan

## 2022-08-28 NOTE — Progress Notes (Signed)
Office: [phone]  /  Fax: [phone]  WEIGHT SUMMARY AND BIOMETRICS  Vitals Temp: 98.4 F (36.9 C) BP: 117/72 Pulse Rate: 68 SpO2: 100 %   Anthropometric Measurements Height: 5\' 4"  (1.626 m) Weight: 171 lb 9.9 oz (77.8 kg) BMI (Calculated): 29.44 Weight at Last Visit: 168 lb Weight Gained Since Last Visit: 3 lb Starting Weight: 190 lb Total Weight Loss (lbs): 17 lb (7.711 kg) Peak Weight: 209 lb   Body Composition  Body Fat %: 39.9 % Fat Mass (lbs): 68.4 lbs Muscle Mass (lbs): 98 lbs Total Body Water (lbs): 71.4 lbs Visceral Fat Rating : 11    No data recorded Today's Visit #: 19  Starting Date: 05/16/21   HPI  Chief Complaint: OBESITY  Wanda Burns is here to discuss her progress with her obesity treatment plan. She is on the the Category 1 Plan and states she is following her eating plan approximately 90 % of the time. She states she is exercising 40 minutes 7 times per week.  Interval History:  Since last office visit she has gained 3 lbs. She reports having problems with cravings for sweets and limiting amount. She is on SSRI and bupropion for disordered eating. She is also on 8 week break from school.  Orixegenic Control: Reports problems with appetite and hunger signals.  Denies problems with satiety and satiation.  Reports problems with eating patterns and portion control.  Reports abnormal cravings. Denies feeling deprived or restricted.   Barriers identified: strong hunger signals and appetite.   Pharmacotherapy for weight loss: She is currently taking Metformin (off label use for incretin effect and / or insulin resistance and / or diabetes prevention) with adequate clinical response  and without side effects. and on SSRI for stress induced disordered eating without clinical response and without side effects..    ASSESSMENT AND PLAN  TREATMENT PLAN FOR OBESITY:  Recommended Dietary Goals  Wanda Burns is currently in the action stage of change.  As such, her goal is to continue weight management plan. She has agreed to: continue current plan  Behavioral Intervention  We discussed the following Behavioral Modification Strategies today: increasing lean protein intake, decreasing simple carbohydrates , increasing vegetables, increasing lower glycemic fruits, increasing water intake, keeping healthy foods at home, avoiding temptations and identifying enticing environmental cues, and planning for success.  Additional resources provided today: None  Recommended Physical Activity Goals  Wanda Burns has been advised to work up to 150 minutes of moderate intensity aerobic activity a week and strengthening exercises 2-3 times per week for cardiovascular health, weight loss maintenance and preservation of muscle mass.   She has agreed to :  Think about ways to increase daily physical activity and overcoming barriers to exercise and Increase physical activity in their day and reduce sedentary time (increase NEAT).  Pharmacotherapy We discussed various medication options to help Wanda Burns with her weight loss efforts and we both agreed to :  Taper citalopram off  . In addition to journaling and increasing physical activity I feel this patient would benefit from pharmacotherapy to assist with hunger signals, satiety and cravings.  After discussion of mechanisms of action, benefits, common side effects, off label use and shared decision making he is agreeable to starting topiramate 25 mg in the evening.   ASSOCIATED CONDITIONS ADDRESSED TODAY  Class 1 obesity due to excess calories with serious comorbidity and body mass index (BMI) of 32.0 to 32.9 in adult -     Topiramate; Take 1 tablet (25 mg total)  by mouth daily.  Dispense: 30 tablet; Refill: 0  Other depression with emotional eating Assessment & Plan: See obesity plan.  Orders: -     Topiramate; Take 1 tablet (25 mg total) by mouth daily.  Dispense: 30 tablet; Refill: 0  Prediabetes Assessment &  Plan: Asymptomatic.  Most recent A1c was 5.7 and improved.  She has lost 15% of baseline body weight.  She is currently on metformin twice a day without any adverse effects.  She will continue medication and lifestyle changes.Marland Kitchen       PHYSICAL EXAM:  Blood pressure 117/72, pulse 68, temperature 98.4 F (36.9 C), height 5\' 4"  (1.626 m), weight 171 lb 9.9 oz (77.8 kg), SpO2 100 %. Body mass index is 29.46 kg/m.  General: She is overweight, cooperative, alert, well developed, and in no acute distress. PSYCH: Has normal mood, affect and thought process.   HEENT: EOMI, sclerae are anicteric. Lungs: Normal breathing effort, no conversational dyspnea. Extremities: No edema.  Neurologic: No gross sensory or motor deficits. No tremors or fasciculations noted.    DIAGNOSTIC DATA REVIEWED:  BMET    Component Value Date/Time   NA 140 10/06/2019 0736   NA 137 03/17/2018 1031   K 4.3 10/06/2019 0736   CL 108 10/06/2019 0736   CO2 22 10/06/2019 0736   GLUCOSE 181 (H) 10/06/2019 0736   BUN 21 10/06/2019 0736   BUN 22 03/17/2018 1031   CREATININE 1.14 (H) 10/06/2019 0736   CALCIUM 9.3 10/06/2019 0736   GFRNONAA 50 (L) 10/06/2019 0736   GFRAA 58 (L) 10/06/2019 0736   Lab Results  Component Value Date   HGBA1C 5.9 (H) 03/17/2018   HGBA1C 6.2 (H) 12/09/2017   Lab Results  Component Value Date   INSULIN 10.7 05/16/2021   INSULIN 14.7 12/09/2017   Lab Results  Component Value Date   TSH 2.620 05/16/2021   CBC    Component Value Date/Time   WBC 11.7 (H) 10/06/2019 0736   RBC 4.41 10/06/2019 0736   HGB 11.9 (L) 10/06/2019 0736   HGB 12.8 12/09/2017 1407   HCT 38.1 10/06/2019 0736   HCT 39.7 12/09/2017 1407   PLT 339 10/06/2019 0736   MCV 86.4 10/06/2019 0736   MCV 81 12/09/2017 1407   MCH 27.0 10/06/2019 0736   MCHC 31.2 10/06/2019 0736   RDW 13.8 10/06/2019 0736   RDW 16.7 (H) 12/09/2017 1407   Iron Studies    Component Value Date/Time   IRON 80 05/23/2022 1540   TIBC  374 05/23/2022 1540   FERRITIN 48 05/23/2022 1540   IRONPCTSAT 21 05/23/2022 1540   Lipid Panel     Component Value Date/Time   CHOL 253 (H) 03/17/2018 1031   TRIG 137 03/17/2018 1031   HDL 64 03/17/2018 1031   CHOLHDL 6 09/04/2010 0840   VLDL 26.8 09/04/2010 0840   LDLCALC 162 (H) 03/17/2018 1031   LDLDIRECT 146.2 09/04/2010 0840   Hepatic Function Panel     Component Value Date/Time   PROT 7.3 03/17/2018 1031   ALBUMIN 4.8 03/17/2018 1031   AST 13 03/17/2018 1031   ALT 16 03/17/2018 1031   ALKPHOS 148 (H) 03/17/2018 1031   BILITOT <0.2 03/17/2018 1031      Component Value Date/Time   TSH 2.620 05/16/2021 0957   Nutritional Lab Results  Component Value Date   VD25OH 52.2 03/17/2018   VD25OH 37.6 12/09/2017     Return in about 3 weeks (around 09/18/2022) for For Weight Mangement with  Dr. Gerarda Fraction.Marland Kitchen She was informed of the importance of frequent follow up visits to maximize her success with intensive lifestyle modifications for her multiple health conditions.   ATTESTASTION STATEMENTS:  Reviewed by clinician on day of visit: allergies, medications, problem list, medical history, surgical history, family history, social history, and previous encounter notes.     Thomes Dinning, MD

## 2022-08-28 NOTE — Assessment & Plan Note (Signed)
Asymptomatic.  Most recent A1c was 5.7 and improved.  She has lost 15% of baseline body weight.  She is currently on metformin twice a day without any adverse effects.  She will continue medication and lifestyle changes..   

## 2022-09-19 ENCOUNTER — Encounter (INDEPENDENT_AMBULATORY_CARE_PROVIDER_SITE_OTHER): Payer: Self-pay | Admitting: Internal Medicine

## 2022-09-29 ENCOUNTER — Other Ambulatory Visit (INDEPENDENT_AMBULATORY_CARE_PROVIDER_SITE_OTHER): Payer: Self-pay | Admitting: Internal Medicine

## 2022-09-29 DIAGNOSIS — F3289 Other specified depressive episodes: Secondary | ICD-10-CM

## 2022-09-29 DIAGNOSIS — E6609 Other obesity due to excess calories: Secondary | ICD-10-CM

## 2022-09-30 ENCOUNTER — Encounter (INDEPENDENT_AMBULATORY_CARE_PROVIDER_SITE_OTHER): Payer: Self-pay | Admitting: Internal Medicine

## 2022-09-30 ENCOUNTER — Ambulatory Visit (INDEPENDENT_AMBULATORY_CARE_PROVIDER_SITE_OTHER): Payer: PPO | Admitting: Internal Medicine

## 2022-09-30 VITALS — BP 105/70 | HR 68 | Temp 97.7°F | Ht 64.0 in | Wt 174.0 lb

## 2022-09-30 DIAGNOSIS — Z6829 Body mass index (BMI) 29.0-29.9, adult: Secondary | ICD-10-CM | POA: Diagnosis not present

## 2022-09-30 DIAGNOSIS — E6609 Other obesity due to excess calories: Secondary | ICD-10-CM

## 2022-09-30 DIAGNOSIS — R7303 Prediabetes: Secondary | ICD-10-CM | POA: Diagnosis not present

## 2022-09-30 DIAGNOSIS — F3289 Other specified depressive episodes: Secondary | ICD-10-CM

## 2022-09-30 NOTE — Assessment & Plan Note (Signed)
Patient has lost 15% of baseline body weight.  Her body fat percentage is also down to 38% goal is less than 34%.  She has done a good job with muscle mass preservation.  She is on metformin for incretin effect and diabetes prevention.  She is also on Wellbutrin and citalopram for emotional eating patterns.  She is not having any side effects and will continue medication.  She did not tolerate topiramate and medication was discontinued.  She is having some difficulty with hunger signals and impaired satiety likely due to metabolic adaptations from chronic dieting.  She may be a candidate for incretin therapy.  We will consider this next to help with ongoing weight management.

## 2022-09-30 NOTE — Assessment & Plan Note (Signed)
Improving on Wellbutrin and citalopram, she will continue current regimen.  She felt she had somewhat of a setback on topiramate medication has been discontinued slowly getting back to baseline.  No reports of SI or HI

## 2022-09-30 NOTE — Assessment & Plan Note (Signed)
Asymptomatic.  Most recent A1c was 5.7 and improved.  She has lost 15% of baseline body weight.  She is currently on metformin XR once a day, advised patient to increase it to twice a day for incretin effect and to assist with weight loss efforts.  She will increase it and let her PCP know.

## 2022-09-30 NOTE — Progress Notes (Signed)
Office: [phone]  /  Fax: [phone]  WEIGHT SUMMARY AND BIOMETRICS  Vitals Temp: 97.7 F (36.5 C) BP: 105/70 Pulse Rate: 68 SpO2: 98 %   Anthropometric Measurements Height: 5\' 4"  (1.626 m) Weight: 174 lb (78.9 kg) BMI (Calculated): 29.85 Weight at Last Visit: 171 lb Weight Gained Since Last Visit: 3 lb Starting Weight: 190 lb Total Weight Loss (lbs): 14 lb (6.35 kg) Peak Weight: 209 lb   Body Composition  Body Fat %: 39.1 % Fat Mass (lbs): 68.4 lbs Muscle Mass (lbs): 101.2 lbs Total Body Water (lbs): 68.8 lbs Visceral Fat Rating : 11    No data recorded Today's Visit #: 20  Starting Date: 09/30/22   HPI  Chief Complaint: OBESITY  Wanda Burns is here to discuss her progress with her obesity treatment plan. She is on the the Category 1 Plan and states she is following her eating plan approximately 90 % of the time. She states she is exercising 20 minutes 3-4 times per week.  Interval History:  Since last office visit she has gained 3 lbs.  She reports good adherence to reduced calorie nutritional plan. She has been working on not skipping meals, eating more fruits, eating more vegetables, drinking more water, and continues to exercise  Orixegenic Control: Reports problems with appetite and hunger signals.  Reports problems with satiety and satiation.  Denies problems with eating patterns and portion control.  Reports abnormal cravings. Denies feeling deprived or restricted.   Barriers identified: strong hunger signals and appetite and medication side effects .   Pharmacotherapy for weight loss: She is currently taking Topiramate (off label use, single agent) with adequate clinical response  and experiencing the following side effects: sleepiness, HA, worsening depression.. Improved after stopping medication.   ASSESSMENT AND PLAN  TREATMENT PLAN FOR OBESITY:  Recommended Dietary Goals  Wanda Burns is currently in the action stage of change. As such, her  goal is to continue weight management plan. She has agreed to: continue current plan  Behavioral Intervention  We discussed the following Behavioral Modification Strategies today: increasing lean protein intake, decreasing simple carbohydrates , increasing vegetables, increasing lower glycemic fruits, increasing fiber rich foods, increasing water intake, and planning for success.  Additional resources provided today: Handout on how to make protein smoothies  Recommended Physical Activity Goals  Wanda Burns has been advised to work up to 150 minutes of moderate intensity aerobic activity a week and strengthening exercises 2-3 times per week for cardiovascular health, weight loss maintenance and preservation of muscle mass.   She has agreed to :  Think about ways to increase daily physical activity and overcoming barriers to exercise  Pharmacotherapy We discussed various medication options to help Wanda Burns with her weight loss efforts and we both agreed to : increase metformin XR 500 mg bid, prescribed by PCP  ASSOCIATED CONDITIONS ADDRESSED TODAY  Class 1 obesity due to excess calories with serious comorbidity and body mass index (BMI) of 32.0 to 32.9 in adult Assessment & Plan: Patient has lost 15% of baseline body weight.  Her body fat percentage is also down to 38% goal is less than 34%.  She has done a good job with muscle mass preservation.  She is on metformin for incretin effect and diabetes prevention.  She is also on Wellbutrin and citalopram for emotional eating patterns.  She is not having any side effects and will continue medication.  She did not tolerate topiramate and medication was discontinued.  She is having some difficulty with hunger  signals and impaired satiety likely due to metabolic adaptations from chronic dieting.  She may be a candidate for incretin therapy.  We will consider this next to help with ongoing weight management.   Pre-diabetes Assessment & Plan: Asymptomatic.   Most recent A1c was 5.7 and improved.  She has lost 15% of baseline body weight.  She is currently on metformin XR once a day, advised patient to increase it to twice a day for incretin effect and to assist with weight loss efforts.  She will increase it and let her PCP know.   Other depression with emotional eating Assessment & Plan: Improving on Wellbutrin and citalopram, she will continue current regimen.  She felt she had somewhat of a setback on topiramate medication has been discontinued slowly getting back to baseline.  No reports of SI or HI     PHYSICAL EXAM:  Blood pressure 105/70, pulse 68, temperature 97.7 F (36.5 C), height 5\' 4"  (1.626 m), weight 174 lb (78.9 kg), SpO2 98%. Body mass index is 29.87 kg/m.  General: She is overweight, cooperative, alert, well developed, and in no acute distress. PSYCH: Has normal mood, affect and thought process.   HEENT: EOMI, sclerae are anicteric. Lungs: Normal breathing effort, no conversational dyspnea. Extremities: No edema.  Neurologic: No gross sensory or motor deficits. No tremors or fasciculations noted.    DIAGNOSTIC DATA REVIEWED:  BMET    Component Value Date/Time   NA 140 10/06/2019 0736   NA 137 03/17/2018 1031   K 4.3 10/06/2019 0736   CL 108 10/06/2019 0736   CO2 22 10/06/2019 0736   GLUCOSE 181 (H) 10/06/2019 0736   BUN 21 10/06/2019 0736   BUN 22 03/17/2018 1031   CREATININE 1.14 (H) 10/06/2019 0736   CALCIUM 9.3 10/06/2019 0736   GFRNONAA 50 (L) 10/06/2019 0736   GFRAA 58 (L) 10/06/2019 0736   Lab Results  Component Value Date   HGBA1C 5.9 (H) 03/17/2018   HGBA1C 6.2 (H) 12/09/2017   Lab Results  Component Value Date   INSULIN 10.7 05/16/2021   INSULIN 14.7 12/09/2017   Lab Results  Component Value Date   TSH 2.620 05/16/2021   CBC    Component Value Date/Time   WBC 11.7 (H) 10/06/2019 0736   RBC 4.41 10/06/2019 0736   HGB 11.9 (L) 10/06/2019 0736   HGB 12.8 12/09/2017 1407   HCT 38.1  10/06/2019 0736   HCT 39.7 12/09/2017 1407   PLT 339 10/06/2019 0736   MCV 86.4 10/06/2019 0736   MCV 81 12/09/2017 1407   MCH 27.0 10/06/2019 0736   MCHC 31.2 10/06/2019 0736   RDW 13.8 10/06/2019 0736   RDW 16.7 (H) 12/09/2017 1407   Iron Studies    Component Value Date/Time   IRON 80 05/23/2022 1540   TIBC 374 05/23/2022 1540   FERRITIN 48 05/23/2022 1540   IRONPCTSAT 21 05/23/2022 1540   Lipid Panel     Component Value Date/Time   CHOL 253 (H) 03/17/2018 1031   TRIG 137 03/17/2018 1031   HDL 64 03/17/2018 1031   CHOLHDL 6 09/04/2010 0840   VLDL 26.8 09/04/2010 0840   LDLCALC 162 (H) 03/17/2018 1031   LDLDIRECT 146.2 09/04/2010 0840   Hepatic Function Panel     Component Value Date/Time   PROT 7.3 03/17/2018 1031   ALBUMIN 4.8 03/17/2018 1031   AST 13 03/17/2018 1031   ALT 16 03/17/2018 1031   ALKPHOS 148 (H) 03/17/2018 1031   BILITOT <0.2 03/17/2018  1031      Component Value Date/Time   TSH 2.620 05/16/2021 0957   Nutritional Lab Results  Component Value Date   VD25OH 52.2 03/17/2018   VD25OH 37.6 12/09/2017     Return in about 4 weeks (around 10/28/2022) for For Weight Mangement with Dr. Rikki Spearing.Marland Kitchen She was informed of the importance of frequent follow up visits to maximize her success with intensive lifestyle modifications for her multiple health conditions.   ATTESTASTION STATEMENTS:  Reviewed by clinician on day of visit: allergies, medications, problem list, medical history, surgical history, family history, social history, and previous encounter notes.     Worthy Rancher, MD

## 2022-10-12 ENCOUNTER — Other Ambulatory Visit (INDEPENDENT_AMBULATORY_CARE_PROVIDER_SITE_OTHER): Payer: Self-pay | Admitting: Internal Medicine

## 2022-10-12 DIAGNOSIS — F3289 Other specified depressive episodes: Secondary | ICD-10-CM

## 2022-10-24 ENCOUNTER — Encounter (INDEPENDENT_AMBULATORY_CARE_PROVIDER_SITE_OTHER): Payer: Self-pay | Admitting: Internal Medicine

## 2022-10-28 ENCOUNTER — Ambulatory Visit (INDEPENDENT_AMBULATORY_CARE_PROVIDER_SITE_OTHER): Payer: PPO | Admitting: Internal Medicine

## 2022-10-28 ENCOUNTER — Encounter (INDEPENDENT_AMBULATORY_CARE_PROVIDER_SITE_OTHER): Payer: Self-pay | Admitting: Internal Medicine

## 2022-10-28 VITALS — BP 98/65 | HR 71 | Temp 98.2°F | Ht 64.0 in | Wt 178.0 lb

## 2022-10-28 DIAGNOSIS — E6609 Other obesity due to excess calories: Secondary | ICD-10-CM

## 2022-10-28 DIAGNOSIS — R7303 Prediabetes: Secondary | ICD-10-CM | POA: Diagnosis not present

## 2022-10-28 DIAGNOSIS — Z6830 Body mass index (BMI) 30.0-30.9, adult: Secondary | ICD-10-CM

## 2022-10-28 DIAGNOSIS — E559 Vitamin D deficiency, unspecified: Secondary | ICD-10-CM | POA: Diagnosis not present

## 2022-10-28 NOTE — Progress Notes (Signed)
Office: [phone]  /  Fax: [phone]  WEIGHT SUMMARY AND BIOMETRICS  Vitals Temp: 98.2 F (36.8 C) BP: 98/65 Pulse Rate: 71 SpO2: 96 %   Anthropometric Measurements Height: 5\' 4"  (1.626 m) Weight: 178 lb (80.7 kg) BMI (Calculated): 30.54 Weight at Last Visit: 174 lb Weight Lost Since Last Visit: 0 lb Weight Gained Since Last Visit: 4 lb Starting Weight: 190 lb Total Weight Loss (lbs): 12 lb (5.443 kg) Peak Weight: 209 lb   Body Composition  Body Fat %: 41.5 % Fat Mass (lbs): 74.2 lbs Muscle Mass (lbs): 99.2 lbs Total Body Water (lbs): 73.8 lbs Visceral Fat Rating : 12    No data recorded Today's Visit #: 21  Starting Date: 05/16/21   HPI  Chief Complaint: OBESITY  Wanda Burns is here to discuss her progress with her obesity treatment plan. She is on the the Category 1 Plan and states she is following her eating plan approximately 80 % of the time. She states she is exercising 20+ minutes 5 times per week.  Interval History:  Since last office visit she has gained 4 lbs. Notes changes in nutrition and decreased PA. Had family with her. She likes vegetables and salads. No fried foods. Does not eat red meat. Does some slowed cooked meats. 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  She reports fair adherence to reduced calorie nutritional plan. She has been working on not skipping meals and drinking more water  Orixegenic Control: Denies problems with appetite and hunger signals.  Denies problems with satiety and satiation.  Denies problems with eating patterns and portion control.  Denies abnormal cravings. Denies feeling deprived or restricted.   Barriers identified:  dieting fatigue, tired of tracking  .   Pharmacotherapy for weight loss: She is currently taking Metformin (off label use for incretin effect and / or insulin resistance and / or diabetes prevention) with adequate clinical response  and without side effects. and Bupropion (single agent, off label use)  with adequate clinical response  and without side effects..    ASSESSMENT AND PLAN  TREATMENT PLAN FOR OBESITY:  Recommended Dietary Goals  Wanda Burns is currently in the action stage of change. As such, her goal is to continue weight management plan. She has agreed to: continue current plan  Behavioral Intervention  We discussed the following Behavioral Modification Strategies today: increasing lean protein intake, decreasing simple carbohydrates , increasing vegetables, increasing lower glycemic fruits, increasing water intake, continue to practice mindfulness when eating, and planning for success.  We discussed to avoid all or none thinking.  I will like for her to take a break from tracking and journaling.  She would focus on keeping things simple as she starts back at work.  Additional resources provided today: None  Recommended Physical Activity Goals  Wanda Burns has been advised to work up to 150 minutes of moderate intensity aerobic activity a week and strengthening exercises 2-3 times per week for cardiovascular health, weight loss maintenance and preservation of muscle mass.   She has agreed to :  Think about ways to increase daily physical activity and overcoming barriers to exercise and Increase physical activity in their day and reduce sedentary time (increase NEAT).  Pharmacotherapy We discussed various medication options to help Wanda Burns with her weight loss efforts and we both agreed to : continue current anti-obesity medication regimen  ASSOCIATED CONDITIONS ADDRESSED TODAY  Pre-diabetes Assessment & Plan: Asymptomatic.  Reviewed labs in Care Everywhere, most recent A1c was 5.7 and improved.  She has  lost 15% of baseline body weight.  She is currently on metformin XR twice daily for  diabetes prevention and incretin effect and to assist with weight loss efforts.     Class 1 obesity due to excess calories with serious comorbidity and body mass index (BMI) of 32.0 to 32.9 in  adult  Vitamin D deficiency Assessment & Plan: Most recent vitamin D levels were in the 60s levels are replenished.  She is taking vitamin D 3 2000 units daily without any adverse effects.  She will continue with over-the-counter supplementation     PHYSICAL EXAM:  Blood pressure 98/65, pulse 71, temperature 98.2 F (36.8 C), height 5\' 4"  (1.626 m), weight 178 lb (80.7 kg), SpO2 96%. Body mass index is 30.55 kg/m.  General: She is overweight, cooperative, alert, well developed, and in no acute distress. PSYCH: Has normal mood, affect and thought process.   HEENT: EOMI, sclerae are anicteric. Lungs: Normal breathing effort, no conversational dyspnea. Extremities: No edema.  Neurologic: No gross sensory or motor deficits. No tremors or fasciculations noted.    DIAGNOSTIC DATA REVIEWED:  BMET    Component Value Date/Time   NA 140 10/06/2019 0736   NA 137 03/17/2018 1031   K 4.3 10/06/2019 0736   CL 108 10/06/2019 0736   CO2 22 10/06/2019 0736   GLUCOSE 181 (H) 10/06/2019 0736   BUN 21 10/06/2019 0736   BUN 22 03/17/2018 1031   CREATININE 1.14 (H) 10/06/2019 0736   CALCIUM 9.3 10/06/2019 0736   GFRNONAA 50 (L) 10/06/2019 0736   GFRAA 58 (L) 10/06/2019 0736   Lab Results  Component Value Date   HGBA1C 5.9 (H) 03/17/2018   HGBA1C 6.2 (H) 12/09/2017   Lab Results  Component Value Date   INSULIN 10.7 05/16/2021   INSULIN 14.7 12/09/2017   Lab Results  Component Value Date   TSH 2.620 05/16/2021   CBC    Component Value Date/Time   WBC 11.7 (H) 10/06/2019 0736   RBC 4.41 10/06/2019 0736   HGB 11.9 (L) 10/06/2019 0736   HGB 12.8 12/09/2017 1407   HCT 38.1 10/06/2019 0736   HCT 39.7 12/09/2017 1407   PLT 339 10/06/2019 0736   MCV 86.4 10/06/2019 0736   MCV 81 12/09/2017 1407   MCH 27.0 10/06/2019 0736   MCHC 31.2 10/06/2019 0736   RDW 13.8 10/06/2019 0736   RDW 16.7 (H) 12/09/2017 1407   Iron Studies    Component Value Date/Time   IRON 80 05/23/2022  1540   TIBC 374 05/23/2022 1540   FERRITIN 48 05/23/2022 1540   IRONPCTSAT 21 05/23/2022 1540   Lipid Panel     Component Value Date/Time   CHOL 253 (H) 03/17/2018 1031   TRIG 137 03/17/2018 1031   HDL 64 03/17/2018 1031   CHOLHDL 6 09/04/2010 0840   VLDL 26.8 09/04/2010 0840   LDLCALC 162 (H) 03/17/2018 1031   LDLDIRECT 146.2 09/04/2010 0840   Hepatic Function Panel     Component Value Date/Time   PROT 7.3 03/17/2018 1031   ALBUMIN 4.8 03/17/2018 1031   AST 13 03/17/2018 1031   ALT 16 03/17/2018 1031   ALKPHOS 148 (H) 03/17/2018 1031   BILITOT <0.2 03/17/2018 1031      Component Value Date/Time   TSH 2.620 05/16/2021 0957   Nutritional Lab Results  Component Value Date   VD25OH 52.2 03/17/2018   VD25OH 37.6 12/09/2017     Return in about 3 weeks (around 11/18/2022) for For Weight Mangement  with Dr. Rikki Spearing.Marland Kitchen She was informed of the importance of frequent follow up visits to maximize her success with intensive lifestyle modifications for her multiple health conditions.   ATTESTASTION STATEMENTS:  Reviewed by clinician on day of visit: allergies, medications, problem list, medical history, surgical history, family history, social history, and previous encounter notes.     Worthy Rancher, MD

## 2022-10-28 NOTE — Assessment & Plan Note (Signed)
Asymptomatic.  Reviewed labs in Care Everywhere, most recent A1c was 5.7 and improved.  She has lost 15% of baseline body weight.  She is currently on metformin XR twice daily for  diabetes prevention and incretin effect and to assist with weight loss efforts.

## 2022-10-28 NOTE — Assessment & Plan Note (Signed)
Most recent vitamin D levels were in the 60s levels are replenished.  She is taking vitamin D 3 2000 units daily without any adverse effects.  She will continue with over-the-counter supplementation 

## 2022-11-14 ENCOUNTER — Telehealth (INDEPENDENT_AMBULATORY_CARE_PROVIDER_SITE_OTHER): Payer: PPO | Admitting: Family Medicine

## 2022-11-20 LAB — BASIC METABOLIC PANEL
BUN: 19 (ref 4–21)
CO2: 26 — AB (ref 13–22)
Chloride: 104 (ref 99–108)
Creatinine: 0.9 (ref 0.5–1.1)
Glucose: 120
Potassium: 4.6 meq/L (ref 3.5–5.1)
Sodium: 139 (ref 137–147)

## 2022-11-20 LAB — HEPATIC FUNCTION PANEL
ALT: 28 U/L (ref 7–35)
AST: 23 (ref 13–35)
Alkaline Phosphatase: 98 (ref 25–125)
Bilirubin, Total: 0.3

## 2022-11-20 LAB — LIPID PANEL
Cholesterol: 218 — AB (ref 0–200)
LDL Cholesterol: 119
LDL Cholesterol: 119
Triglycerides: 147 (ref 40–160)

## 2022-11-20 LAB — COMPREHENSIVE METABOLIC PANEL: eGFR: 68

## 2022-11-20 LAB — CBC: RBC: 4.53 (ref 3.87–5.11)

## 2022-11-20 LAB — CBC AND DIFFERENTIAL
HCT: 41 (ref 36–46)
Hemoglobin: 14.1 (ref 12.0–16.0)
Platelets: 319 10*3/uL (ref 150–400)
WBC: 5.7

## 2022-11-20 LAB — HEMOGLOBIN A1C: Hemoglobin A1C: 5.8

## 2022-12-09 ENCOUNTER — Ambulatory Visit (INDEPENDENT_AMBULATORY_CARE_PROVIDER_SITE_OTHER): Payer: PPO | Admitting: Internal Medicine

## 2022-12-09 ENCOUNTER — Encounter (INDEPENDENT_AMBULATORY_CARE_PROVIDER_SITE_OTHER): Payer: Self-pay | Admitting: Internal Medicine

## 2022-12-09 VITALS — BP 121/78 | HR 68 | Temp 97.9°F | Ht 64.0 in | Wt 178.0 lb

## 2022-12-09 DIAGNOSIS — F5089 Other specified eating disorder: Secondary | ICD-10-CM

## 2022-12-09 DIAGNOSIS — F3289 Other specified depressive episodes: Secondary | ICD-10-CM | POA: Diagnosis not present

## 2022-12-09 DIAGNOSIS — E6609 Other obesity due to excess calories: Secondary | ICD-10-CM

## 2022-12-09 DIAGNOSIS — Z6832 Body mass index (BMI) 32.0-32.9, adult: Secondary | ICD-10-CM

## 2022-12-09 DIAGNOSIS — R638 Other symptoms and signs concerning food and fluid intake: Secondary | ICD-10-CM | POA: Diagnosis not present

## 2022-12-09 DIAGNOSIS — R7303 Prediabetes: Secondary | ICD-10-CM | POA: Diagnosis not present

## 2022-12-09 MED ORDER — RYBELSUS 3 MG PO TABS
1.0000 | ORAL_TABLET | Freq: Every day | ORAL | 0 refills | Status: DC
Start: 2022-12-09 — End: 2023-01-20

## 2022-12-09 MED ORDER — CITALOPRAM HYDROBROMIDE 10 MG PO TABS: 10.0000 mg | ORAL_TABLET | Freq: Every day | ORAL | 0 refills | Status: AC

## 2022-12-09 NOTE — Progress Notes (Signed)
Office: [phone]  /  Fax: [phone]  WEIGHT SUMMARY AND BIOMETRICS  Vitals Temp: 97.9 F (36.6 C) BP: 121/78 Pulse Rate: 68 SpO2: 98 %   Anthropometric Measurements Height: 5\' 4"  (1.626 m) Weight: 178 lb (80.7 kg) BMI (Calculated): 30.54 Weight at Last Visit: 178 lb Weight Lost Since Last Visit: 0 lb Weight Gained Since Last Visit: 0 lb Starting Weight: 190 lb Total Weight Loss (lbs): 12 lb (5.443 kg) Peak Weight: 209 lb   Body Composition  Body Fat %: 41.4 % Fat Mass (lbs): 73.8 lbs Muscle Mass (lbs): 99.2 lbs Total Body Water (lbs): 72 lbs Visceral Fat Rating : 12    No data recorded Today's Visit #: 22  Starting Date: 05/16/21   HPI  Chief Complaint: OBESITY  Wanda Burns is here to discuss Wanda Burns progress with Wanda Burns obesity treatment plan. She is on the the Category 1 Plan and states she is following Wanda Burns eating plan approximately 80 % of the time. She states she is exercising 20 minutes 5 times per week plus working.  Interval History:  Since last office visit she has maintained weight. She reports good adherence to reduced calorie nutritional plan. She has been working on not skipping meals, eating more fruits, eating more vegetables, and has noticed increased sense of hunger decreased satiety.  Orexigenic Control: Reports problems with appetite and hunger signals.  Reports problems with satiety and satiation.  Reports problems with eating patterns and portion control.  Denies abnormal cravings. Denies feeling deprived or restricted.   Barriers identified: strong hunger signals and appetite and cost of medication.   Pharmacotherapy for weight loss: She is currently taking Metformin (off label use for incretin effect and / or insulin resistance and / or diabetes prevention) with adequate clinical response  and without side effects., Bupropion (single agent, off label use) with adequate clinical response  and without side effects., and on SSRI for stress  induced disordered eating with adequate clinical response  and without side effects..    ASSESSMENT AND PLAN  TREATMENT PLAN FOR OBESITY:  Recommended Dietary Goals  Wanda Burns is currently in the action stage of change. As such, Wanda Burns goal is to continue weight management plan. She has agreed to: continue current plan  Behavioral Intervention  We discussed the following Behavioral Modification Strategies today: increasing lean protein intake, decreasing simple carbohydrates , increasing vegetables, increasing lower glycemic fruits, increasing water intake, continue to practice mindfulness when eating, and planning for success.  Additional resources provided today: None  Recommended Physical Activity Goals  Wanda Burns has been advised to work up to 150 minutes of moderate intensity aerobic activity a week and strengthening exercises 2-3 times per week for cardiovascular health, weight loss maintenance and preservation of muscle mass.   She has agreed to :  Think about ways to increase daily physical activity and overcoming barriers to exercise and Increase physical activity in their day and reduce sedentary time (increase NEAT).  Pharmacotherapy We discussed various medication options to help Wanda Burns with Wanda Burns weight loss efforts and we both agreed to :  Start semaglutide oral for 3 mg once daily for diabetes prevention and weight management  ASSOCIATED CONDITIONS ADDRESSED TODAY  Pre-diabetes Assessment & Plan: Asymptomatic.  Reviewed labs in Care Everywhere, most recent A1c was 5.8 from September 11 and slightly higher. She has lost 15% of baseline body weight.  She is currently on metformin XR twice daily for  diabetes prevention because of increased orixegenic signaling she would benefit from incretin therapy.  After discussion of benefits and side effect she will be started on Rybelsus 3 mg once a day for pharmacoprophylaxis and incretin effect.  Orders: -     Rybelsus; Take 1 tablet (3 mg  total) by mouth daily.  Dispense: 30 tablet; Refill: 0  Other depression with emotional eating Assessment & Plan: Improving on Wellbutrin and citalopram, she will continue current regimen.  Suspect some of Wanda Burns abulia may be secondary to chronic use of benzodiazepine this was explained to Wanda Burns.  Encouraged Wanda Burns to engage in regular physical activity.  Orders: -     Citalopram Hydrobromide; Take 1 tablet (10 mg total) by mouth daily.  Dispense: 90 tablet; Refill: 0  Abnormal food appetite Assessment & Plan: She has increased orexigenic signaling, impaired satiety and inhibitory control. This is secondary to an abnormal energy regulation system and pathological neurohormonal pathways characteristic of excess adiposity.  In addition to nutritional and behavioral strategies she benefits from pharmacotherapy.     Class 1 obesity due to excess calories with serious comorbidity and body mass index (BMI) of 32.0 to 32.9 in adult Assessment & Plan: Patient has lost 15% of baseline body weight.  Wanda Burns body fat percentage is also down to 38% goal is less than 34%.  She has done a good job with muscle mass preservation.  She is on metformin for incretin effect and diabetes prevention.  She is also on Wellbutrin and citalopram for emotional eating patterns.   Because of metabolic adaptations from chronic dieting she is noticing an increase in hunger and impaired satiety which is affecting Wanda Burns weight loss.  She did not tolerate topiramate and medication was discontinued.  She also does not have coverage for [facility] or Zepbound.  After discussion of benefits and side effect she will be started on Rybelsus 3 mg once a day     PHYSICAL EXAM:  Blood pressure 121/78, pulse 68, temperature 97.9 F (36.6 C), height 5\' 4"  (1.626 m), weight 178 lb (80.7 kg), SpO2 98%. Body mass index is 30.55 kg/m.  General: She is overweight, cooperative, alert, well developed, and in no acute distress. PSYCH: Has normal mood,  affect and thought process.   HEENT: EOMI, sclerae are anicteric. Lungs: Normal breathing effort, no conversational dyspnea. Extremities: No edema.  Neurologic: No gross sensory or motor deficits. No tremors or fasciculations noted.    DIAGNOSTIC DATA REVIEWED:  BMET    Component Value Date/Time   NA 140 10/06/2019 0736   NA 137 03/17/2018 1031   K 4.3 10/06/2019 0736   CL 108 10/06/2019 0736   CO2 22 10/06/2019 0736   GLUCOSE 181 (H) 10/06/2019 0736   BUN 21 10/06/2019 0736   BUN 22 03/17/2018 1031   CREATININE 1.14 (H) 10/06/2019 0736   CALCIUM 9.3 10/06/2019 0736   GFRNONAA 50 (L) 10/06/2019 0736   GFRAA 58 (L) 10/06/2019 0736   Lab Results  Component Value Date   HGBA1C 5.9 (H) 03/17/2018   HGBA1C 6.2 (H) 12/09/2017   Lab Results  Component Value Date   INSULIN 10.7 05/16/2021   INSULIN 14.7 12/09/2017   Lab Results  Component Value Date   TSH 2.620 05/16/2021   CBC    Component Value Date/Time   WBC 11.7 (H) 10/06/2019 0736   RBC 4.41 10/06/2019 0736   HGB 11.9 (L) 10/06/2019 0736   HGB 12.8 12/09/2017 1407   HCT 38.1 10/06/2019 0736   HCT 39.7 12/09/2017 1407   PLT 339 10/06/2019 0736   MCV 86.4  10/06/2019 0736   MCV 81 12/09/2017 1407   MCH 27.0 10/06/2019 0736   MCHC 31.2 10/06/2019 0736   RDW 13.8 10/06/2019 0736   RDW 16.7 (H) 12/09/2017 1407   Iron Studies    Component Value Date/Time   IRON 80 05/23/2022 1540   TIBC 374 05/23/2022 1540   FERRITIN 48 05/23/2022 1540   IRONPCTSAT 21 05/23/2022 1540   Lipid Panel     Component Value Date/Time   CHOL 253 (H) 03/17/2018 1031   TRIG 137 03/17/2018 1031   HDL 64 03/17/2018 1031   CHOLHDL 6 09/04/2010 0840   VLDL 26.8 09/04/2010 0840   LDLCALC 162 (H) 03/17/2018 1031   LDLDIRECT 146.2 09/04/2010 0840   Hepatic Function Panel     Component Value Date/Time   PROT 7.3 03/17/2018 1031   ALBUMIN 4.8 03/17/2018 1031   AST 13 03/17/2018 1031   ALT 16 03/17/2018 1031   ALKPHOS 148 (H)  03/17/2018 1031   BILITOT <0.2 03/17/2018 1031      Component Value Date/Time   TSH 2.620 05/16/2021 0957   Nutritional Lab Results  Component Value Date   VD25OH 52.2 03/17/2018   VD25OH 37.6 12/09/2017     Return in about 4 weeks (around 01/06/2023) for For Weight Mangement with Dr. Rikki Spearing.Marland Kitchen She was informed of the importance of frequent follow up visits to maximize Wanda Burns success with intensive lifestyle modifications for Wanda Burns multiple health conditions.   ATTESTASTION STATEMENTS:  Reviewed by clinician on day of visit: allergies, medications, problem list, medical history, surgical history, family history, social history, and previous encounter notes.     Worthy Rancher, MD

## 2022-12-09 NOTE — Assessment & Plan Note (Signed)
Improving on Wellbutrin and citalopram, she will continue current regimen.  Suspect some of her abulia may be secondary to chronic use of benzodiazepine this was explained to her.  Encouraged her to engage in regular physical activity.

## 2022-12-09 NOTE — Assessment & Plan Note (Signed)
Patient has lost 15% of baseline body weight.  Her body fat percentage is also down to 38% goal is less than 34%.  She has done a good job with muscle mass preservation.  She is on metformin for incretin effect and diabetes prevention.  She is also on Wellbutrin and citalopram for emotional eating patterns.   Because of metabolic adaptations from chronic dieting she is noticing an increase in hunger and impaired satiety which is affecting her weight loss.  She did not tolerate topiramate and medication was discontinued.  She also does not have coverage for [facility] or Zepbound.  After discussion of benefits and side effect she will be started on Rybelsus 3 mg once a day

## 2022-12-09 NOTE — Assessment & Plan Note (Signed)
She has increased orexigenic signaling, impaired satiety and inhibitory control. This is secondary to an abnormal energy regulation system and pathological neurohormonal pathways characteristic of excess adiposity.  In addition to nutritional and behavioral strategies she benefits from pharmacotherapy.

## 2022-12-09 NOTE — Assessment & Plan Note (Signed)
Asymptomatic.  Reviewed labs in Care Everywhere, most recent A1c was 5.8 from September 11 and slightly higher. She has lost 15% of baseline body weight.  She is currently on metformin XR twice daily for  diabetes prevention because of increased orixegenic signaling she would benefit from incretin therapy.  After discussion of benefits and side effect she will be started on Rybelsus 3 mg once a day for pharmacoprophylaxis and incretin effect.

## 2022-12-10 ENCOUNTER — Telehealth: Payer: Self-pay

## 2022-12-10 NOTE — Telephone Encounter (Signed)
Started a PA for Rybelsus via covermymeds.

## 2022-12-19 ENCOUNTER — Encounter (INDEPENDENT_AMBULATORY_CARE_PROVIDER_SITE_OTHER): Payer: Self-pay | Admitting: Internal Medicine

## 2023-01-05 ENCOUNTER — Other Ambulatory Visit: Payer: Self-pay

## 2023-01-05 ENCOUNTER — Emergency Department (HOSPITAL_BASED_OUTPATIENT_CLINIC_OR_DEPARTMENT_OTHER)
Admission: EM | Admit: 2023-01-05 | Discharge: 2023-01-05 | Disposition: A | Discharge status: Home / Self Care | Payer: PPO | Attending: Emergency Medicine | Admitting: Emergency Medicine

## 2023-01-05 ENCOUNTER — Encounter (HOSPITAL_BASED_OUTPATIENT_CLINIC_OR_DEPARTMENT_OTHER): Payer: Self-pay | Admitting: Emergency Medicine

## 2023-01-05 DIAGNOSIS — J069 Acute upper respiratory infection, unspecified: Secondary | ICD-10-CM | POA: Insufficient documentation

## 2023-01-05 DIAGNOSIS — R059 Cough, unspecified: Secondary | ICD-10-CM | POA: Diagnosis present

## 2023-01-05 MED ORDER — HYDROCODONE BIT-HOMATROP MBR 5-1.5 MG/5ML PO SOLN: 5.0000 mL | Freq: Four times a day (QID) | ORAL | 0 refills | Status: AC | PRN

## 2023-01-05 NOTE — ED Triage Notes (Signed)
Sore throat a week and half ago. Received steroid shot from primary. Taking codeine cough syrup as well. Pt also has runny nose, occasional loses voice, congestion, thick /yellow mucus. Coughing up yellow mucus. No fevers.

## 2023-01-05 NOTE — ED Notes (Signed)
Dc instructions reviewed with patient. Patient voiced understanding. Dc with belongings.  °

## 2023-01-05 NOTE — Discharge Instructions (Signed)
Take tylenol 2 pills 4 times a day and motrin 4 pills 3 times a day.  Drink plenty of fluids.  Return for worsening shortness of breath, headache, confusion. Follow up with your family doctor.   

## 2023-01-05 NOTE — ED Triage Notes (Signed)
Strep was negative.

## 2023-01-05 NOTE — ED Provider Notes (Signed)
[place] EMERGENCY DEPARTMENT AT [facility] Provider Note   CSN: [id number] Arrival date & time: 01/05/23  7829     History  No chief complaint on file.   Wanda Burns is a 72 y.o. female.  72 yo F with a cc of cough, congestion sore throat.  Going on for the past week or so.  Seen by PCP negative strep swab and she was started on a narcotic cough syrup.  She does not think that things have gotten worse just they have not gotten any better.  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        Home Medications Prior to Admission medications   Medication Sig Start Date End Date Taking? Authorizing Provider  HYDROcodone bit-homatropine (HYDROMET) 5-1.5 MG/5ML syrup Take 5 mLs by mouth every 6 (six) hours as needed for cough. 01/05/23  Yes Melene Plan, DO  acetaminophen (TYLENOL) 500 MG tablet Take 1,000 mg by mouth every 6 (six) hours as needed for mild pain.    [provider]  buPROPion (WELLBUTRIN XL) 300 MG 24 hr tablet Take 300 mg by mouth every morning. 08/10/21   [provider]  citalopram (CELEXA) 10 MG tablet Take 1 tablet (10 mg total) by mouth daily. 12/09/22   Worthy Rancher, MD  LORazepam (ATIVAN) 1 MG tablet Take 0.5-1 mg by mouth at bedtime.    [provider]  meclizine (ANTIVERT) 25 MG tablet Take 25 mg by mouth 3 (three) times daily as needed for dizziness. 06/01/18   [provider]  meloxicam (MOBIC) 7.5 MG tablet Take 7.5 mg by mouth 2 (two) times daily as needed. 01/24/21   [provider]  metFORMIN (GLUCOPHAGE-XR) 500 MG 24 hr tablet Take 500 mg by mouth 2 (two) times daily with a meal.    [provider]  neomycin-polymyxin-hydrocortisone (CORTISPORIN) OTIC solution Use as directed. 4 drops to the nailbed after foot soaks. 05/02/22   McDonald, Rachelle Hora, DPM  polyethylene glycol powder (GLYCOLAX/MIRALAX) 17 GM/SCOOP powder Take 17 g by  mouth daily. 10/06/19   Renne Crigler, PA-C  PROAIR HFA 108 [phone] Base) MCG/ACT inhaler Inhale 2 puffs into the lungs daily at 6 (six) AM. 04/12/19   [provider]  Semaglutide (RYBELSUS) 3 MG TABS Take 1 tablet (3 mg total) by mouth daily. 12/09/22   Worthy Rancher, MD      Allergies    Topiramate, Codeine, and Prednisone    Review of Systems   Review of Systems  Physical Exam Updated Vital Signs BP 139/78   Pulse 88   Temp 98.7 F (37.1 C)   Resp 12   Ht 5\' 4"  (1.626 m)   Wt 81.6 kg   SpO2 95%   BMI 30.90 kg/m  Physical Exam Vitals and nursing note reviewed.  Constitutional:      General: She is not in acute distress.    Appearance: She is well-developed. She is not diaphoretic.  HENT:     Head: Normocephalic and atraumatic.     Comments: Swollen turbinates, posterior nasal drip, no noted sinus ttp, tm normal bilaterally.   Eyes:     Pupils: Pupils are equal, round, and reactive to light.  Cardiovascular:     Rate and Rhythm: Normal rate and regular rhythm.     Heart sounds: No murmur heard.    No friction rub. No gallop.  Pulmonary:     Effort: Pulmonary  effort is normal.     Breath sounds: No wheezing or rales.  Abdominal:     General: There is no distension.     Palpations: Abdomen is soft.     Tenderness: There is no abdominal tenderness.  Musculoskeletal:        General: No tenderness.     Cervical back: Normal range of motion and neck supple.  Skin:    General: Skin is warm and dry.  Neurological:     Mental Status: She is alert and oriented to person, place, and time.  Psychiatric:        Behavior: Behavior normal.     ED Results / Procedures / Treatments   Labs (all labs ordered are listed, but only abnormal results are displayed) Labs Reviewed - No data to display  EKG EKG Interpretation Date/Time:  Sunday January 05 2023 07:04:11 EDT Ventricular Rate:  79 PR Interval:  147 QRS Duration:  85 QT Interval:  365 QTC  Calculation: 419 R Axis:   64  Text Interpretation: Sinus rhythm No significant change since last tracing Confirmed by Lawanda Holzheimer ([id number]) on 01/05/2023 7:12:50 AM  Radiology No results found.  Procedures Procedures    Medications Ordered in ED Medications - No data to display  ED Course/ Medical Decision Making/ A&P                                 Medical Decision Making Risk Prescription drug management.   72 yo F with a chief complaints of cough congestion sore throat.  Going on for about a week.  She is well-appearing nontoxic.  No hypoxia no tachypnea clear lung sounds for me.  Do not feel she benefit from chest imaging at this time.  No bacterial source was found on exam.  Will treat as a viral syndrome.  She is asking for refill of her narcotic cough syrup and a longer work note.  PCP follow-up.   7:14 AM:  I have discussed the diagnosis/risks/treatment options with the patient.  Evaluation and diagnostic testing in the emergency department does not suggest an emergent condition requiring admission or immediate intervention beyond what has been performed at this time.  They will follow up with PCP. We also discussed returning to the ED immediately if new or worsening sx occur. We discussed the sx which are most concerning (e.g., sudden worsening pain, fever, inability to tolerate by mouth) that necessitate immediate return. Medications administered to the patient during their visit and any new prescriptions provided to the patient are listed below.  Medications given during this visit Medications - No data to display   The patient appears reasonably screen and/or stabilized for discharge and I doubt any other medical condition or other EMC requiring further screening, evaluation, or treatment in the ED at this time prior to discharge.          Final Clinical Impression(s) / ED Diagnoses Final diagnoses:  Viral URI with cough    Rx / DC Orders ED Discharge Orders           Ordered    HYDROcodone bit-homatropine (HYDROMET) 5-1.5 MG/5ML syrup  Every 6 hours PRN        10 /27/24 0712              Melene Plan, DO 01/05/23 9604

## 2023-01-06 ENCOUNTER — Telehealth (INDEPENDENT_AMBULATORY_CARE_PROVIDER_SITE_OTHER): Payer: Self-pay

## 2023-01-06 ENCOUNTER — Ambulatory Visit (INDEPENDENT_AMBULATORY_CARE_PROVIDER_SITE_OTHER): Payer: PPO | Admitting: Internal Medicine

## 2023-01-06 NOTE — Telephone Encounter (Signed)
PA for Rybelsus has been denied. Pt notified via MyChart.

## 2023-01-20 ENCOUNTER — Encounter (INDEPENDENT_AMBULATORY_CARE_PROVIDER_SITE_OTHER): Payer: Self-pay | Admitting: Internal Medicine

## 2023-01-20 ENCOUNTER — Ambulatory Visit (INDEPENDENT_AMBULATORY_CARE_PROVIDER_SITE_OTHER): Payer: PPO | Admitting: Internal Medicine

## 2023-01-20 VITALS — BP 100/62 | HR 70 | Temp 98.4°F | Ht 64.0 in | Wt 179.0 lb

## 2023-01-20 DIAGNOSIS — R7303 Prediabetes: Secondary | ICD-10-CM | POA: Diagnosis not present

## 2023-01-20 DIAGNOSIS — E785 Hyperlipidemia, unspecified: Secondary | ICD-10-CM | POA: Diagnosis not present

## 2023-01-20 DIAGNOSIS — R948 Abnormal results of function studies of other organs and systems: Secondary | ICD-10-CM | POA: Diagnosis not present

## 2023-01-20 DIAGNOSIS — E88819 Insulin resistance, unspecified: Secondary | ICD-10-CM

## 2023-01-20 DIAGNOSIS — E669 Obesity, unspecified: Secondary | ICD-10-CM

## 2023-01-20 DIAGNOSIS — Z6830 Body mass index (BMI) 30.0-30.9, adult: Secondary | ICD-10-CM

## 2023-01-20 DIAGNOSIS — E66811 Obesity, class 1: Secondary | ICD-10-CM

## 2023-01-20 DIAGNOSIS — E7800 Pure hypercholesterolemia, unspecified: Secondary | ICD-10-CM

## 2023-01-20 NOTE — Progress Notes (Signed)
Office: [phone]  /  Fax: [phone]  Weight Summary And Biometrics  Vitals Temp: 98.4 F (36.9 C) BP: 100/62 Pulse Rate: 70 SpO2: 98 %   Anthropometric Measurements Height: 5\' 4"  (1.626 m) Weight: 179 lb (81.2 kg) BMI (Calculated): 30.71 Weight at Last Visit: 178 lb Weight Lost Since Last Visit: 0 lb Weight Gained Since Last Visit: 1  lb Starting Weight: 190 Total Weight Loss (lbs): 11 lb (4.99 kg) Peak Weight: 209 lb   Body Composition  Body Fat %: 39.8 % Fat Mass (lbs): 71.4 lbs Muscle Mass (lbs): 102.8 lbs Total Body Water (lbs): 69.8 lbs Visceral Fat Rating : 11    No data recorded Today's Visit #: 23  Starting Date: 05/16/21   Subjective   Chief Complaint: Obesity  Wanda Burns is here to discuss her progress with her obesity treatment plan. She is on the the Category 1 Plan and states she is following her eating plan approximately 78 % of the time. She states she is not exercising.  Interval History:   Discussed the use of AI scribe software for clinical note transcription with the patient, who gave verbal consent to proceed.  History of Present Illness    Wanda Burns presents today for follow-up on weight management.The patient has been experiencing a lack of motivation and describes a sense of 'dieting fatigue.' However, she is now attempting to regain her previous mindset and is making efforts to improve her diet. She has been incorporating salads and low-carb tortillas into her meals, and is working on portion control. She has also been meal prepping, including roasting meats and vegetables, to ensure healthier choices are readily available.  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 She has been mindful of her food choices, focusing on the quality of food rather than calorie counting.  The  patient's recent blood work showed improvements in her metabolic derangements, with a decrease in A1C and stable cholesterol levels. She has been taking high dose vitamin D and has been mindful of her cardiovascular risk. The patient is reluctant to start any new medications and prefers to manage her health through diet and exercise. She has been using a protein powder for meal replacements but is not satisfied with the current product due to an unpleasant aftertaste. She is considering making her own protein smoothies as an alternative.     Orexigenic Control:  Denies problems with appetite and hunger signals.  Denies problems with satiety and satiation.  Denies problems with eating patterns and portion control.  Denies abnormal cravings. Denies feeling deprived or restricted.   Barriers identified: none.   Pharmacotherapy for weight loss: She is currently taking Metformin (off label use for incretin effect and / or insulin resistance and / or diabetes prevention) with adequate clinical response  and without side effects. and Bupropion (single agent, off label use) with adequate clinical response  and without side effects..   Assessment and Plan   Treatment Plan For Obesity:  Recommended Dietary Goals  Wanda Burns is currently in the action stage of change. As such, her goal is to continue weight management plan. She has agreed to: continue current plan  Behavioral Intervention  We discussed the following Behavioral Modification Strategies today: continue to work on maintaining a reduced calorie state, getting the recommended amount of protein, incorporating whole foods, making healthy choices, staying well hydrated and  practicing mindfulness when eating..  Additional resources provided today: Handout on popular protein drinks  and Handout on how to make protein smoothies  Recommended Physical Activity Goals  Wanda Burns has been advised to work up to 150 minutes of moderate intensity aerobic  activity a week and strengthening exercises 2-3 times per week for cardiovascular health, weight loss maintenance and preservation of muscle mass.   She has agreed to :  Think about enjoyable ways to increase daily physical activity and overcoming barriers to exercise and Increase physical activity in their day and reduce sedentary time (increase NEAT).  Pharmacotherapy  We discussed various medication options to help Wanda Burns with her weight loss efforts and we both agreed to : continue with nutritional and behavioral strategies  Associated Conditions Addressed Today  Assessment and Plan    Weight Management   She presents for weight management, grappling with insulin resistance, hyperlipidemia, prediabetes, and a reduced metabolic rate. She reports experiencing dieting fatigue intermittently but is striving to regain motivation. Physical activity has been curtailed due to a recent illness, but she previously engaged in app-based workouts and physical therapy exercises. We discussed the importance of mindfulness eating, emphasizing nutritious foods and portion control while advising against calorie counting. Instead, the focus should be on the quality of food and listening to body cues. We emphasized the need to reassess her metabolic rate to understand changes over time. We will reassess her metabolic rate with an indirect calorimeter in four weeks and encourage mindfulness eating. We recommend meal replacements such as protein smoothies or prepackaged protein shakes and have scheduled a follow-up appointment in four weeks, requiring fasting for the indirect calorimeter assessment.  Insulin Resistance   Her recent A1c improved from 6.2 to 5.8, indicating better glycemic control. We discussed the benefits of continuing dietary modifications, focusing on low-carb and high-protein options. She will continue these dietary modifications and monitor A1c and other relevant metabolic markers during the  follow-up.    Prediabetes   With a recent improvement in A1c from 6.2 to 5.8, we discussed the importance of continuing dietary and lifestyle modifications to maintain or further improve glycemic control. She will continue these modifications and monitor A1c and other relevant metabolic markers during the follow-up. She is on metformin for pharmacoprophylaxis and tolerating medication well  Hyperlipidemia   Her recent total cholesterol was 218, and LDL was 119. Preferring lifestyle modifications over medication, we discussed cardiovascular risk and the importance of dietary modifications to improve her lipid profile. She will continue monitoring cholesterol levels, encourage dietary modifications to improve the lipid profile, and continue lifestyle interventions.  General Health Maintenance   Her Vitamin D level was 68 in September, and she is currently taking 2000 IU of vitamin D daily.  She was previously on high dose and now on the lower dose.  Continue low-dose vitamin D  Follow-up   A follow-up appointment is scheduled in four weeks, requiring fasting for the indirect calorimeter assessment. The appointment is set for early morning to accommodate her work schedule.        Objective   Physical Exam:  Blood pressure 100/62, pulse 70, temperature 98.4 F (36.9 C), height 5\' 4"  (1.626 m), weight 179 lb (81.2 kg), SpO2 98%. Body mass index is 30.73 kg/m.  General: She is overweight, cooperative, alert, well developed, and in no acute distress. PSYCH: Has normal mood, affect and thought process.   HEENT: EOMI, sclerae are anicteric. Lungs: Normal breathing effort, no conversational dyspnea. Extremities: No edema.  Neurologic: No gross sensory or motor deficits. No tremors or fasciculations noted.    Diagnostic Data Reviewed:  BMET    Component Value Date/Time   NA 139 11/20/2022 0000   K 4.6 11/20/2022 0000   CL 104 11/20/2022 0000   CO2 26 (A) 11/20/2022 0000   GLUCOSE 181  (H) 10/06/2019 0736   BUN 19 11/20/2022 0000   CREATININE 0.9 11/20/2022 0000   CREATININE 1.14 (H) 10/06/2019 0736   CALCIUM 9.3 10/06/2019 0736   GFRNONAA 50 (L) 10/06/2019 0736   GFRAA 58 (L) 10/06/2019 0736   Lab Results  Component Value Date   HGBA1C 5.8 11/20/2022   HGBA1C 6.2 (H) 12/09/2017   Lab Results  Component Value Date   INSULIN 10.7 05/16/2021   INSULIN 14.7 12/09/2017   Lab Results  Component Value Date   TSH 2.620 05/16/2021   CBC    Component Value Date/Time   WBC 5.7 11/20/2022 0000   WBC 11.7 (H) 10/06/2019 0736   RBC 4.53 11/20/2022 0000   HGB 14.1 11/20/2022 0000   HGB 12.8 12/09/2017 1407   HCT 41 11/20/2022 0000   HCT 39.7 12/09/2017 1407   PLT 319 11/20/2022 0000   MCV 86.4 10/06/2019 0736   MCV 81 12/09/2017 1407   MCH 27.0 10/06/2019 0736   MCHC 31.2 10/06/2019 0736   RDW 13.8 10/06/2019 0736   RDW 16.7 (H) 12/09/2017 1407   Iron Studies    Component Value Date/Time   IRON 80 05/23/2022 1540   TIBC 374 05/23/2022 1540   FERRITIN 48 05/23/2022 1540   IRONPCTSAT 21 05/23/2022 1540   Lipid Panel     Component Value Date/Time   CHOL 218 (A) 11/20/2022 0000   CHOL 253 (H) 03/17/2018 1031   TRIG 147 11/20/2022 0000   HDL 64 03/17/2018 1031   CHOLHDL 6 09/04/2010 0840   VLDL 26.8 09/04/2010 0840   LDLCALC 119 11/20/2022 0000   LDLCALC 119 11/20/2022 0000   LDLCALC 162 (H) 03/17/2018 1031   LDLDIRECT 146.2 09/04/2010 0840   Hepatic Function Panel     Component Value Date/Time   PROT 7.3 03/17/2018 1031   ALBUMIN 4.8 03/17/2018 1031   AST 23 11/20/2022 0000   ALT 28 11/20/2022 0000   ALKPHOS 98 11/20/2022 0000   BILITOT <0.2 03/17/2018 1031      Component Value Date/Time   TSH 2.620 05/16/2021 0957   Nutritional Lab Results  Component Value Date   VD25OH 52.2 03/17/2018   VD25OH 37.6 12/09/2017    Follow-Up   Return in about 4 weeks (around 02/17/2023) for Fasting for IC, needs early appointment., For Weight  Mangement with Dr. Rikki Spearing.Marland Kitchen She was informed of the importance of frequent follow up visits to maximize her success with intensive lifestyle modifications for her multiple health conditions.  Attestation Statement   Reviewed by clinician on day of visit: allergies, medications, problem list, medical history, surgical history, family history, social history, and previous encounter notes.     Worthy Rancher, MD

## 2023-02-20 ENCOUNTER — Ambulatory Visit (INDEPENDENT_AMBULATORY_CARE_PROVIDER_SITE_OTHER): Payer: PPO | Admitting: Internal Medicine

## 2023-03-03 NOTE — Progress Notes (Unsigned)
SUBJECTIVE:  Chief Complaint: Obesity  Interim History: She is up 5 lbs since her last visit.   Wanda Burns, a 72 year old individual with a history of prediabetes, hypercholesterolemia, depression, and obesity, presents for a follow-up visit regarding her obesity treatment plan. She reports a slight improvement in her resting energy expenditure, as evidenced by an increase from 1469 to 1483 calories since her last visit in March 2023. Despite this improvement, she has experienced a weight gain, which she attributes to decreased physical activity and increased consumption of sweet foods during the summer. She also reports a craving for sweet foods, particularly donuts and cookies, which she has struggled to control.  In addition to her struggles with weight management, she reports swelling and pain in her knee, which has become unbearable. She believes she may have injured her knee, which has been previously diagnosed with arthritis. 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 She has tried self-management strategies, including the use of braces and alternating heat and cold therapy, with limited success. She has also been taking meloxicam sparingly due to concerns about its potential impact on her kidney function.  Her current medications include Wellbutrin XL 300 mg daily, Celexa 10 mg daily, metformin 500 mg twice daily, and Miralax 17 grams daily for constipation. She reports that her energy level is generally good, but she struggles with motivation, particularly in relation to meal preparation in the evenings. She expresses frustration and disappointment with her recent weight gain and her inability to control her sweet cravings. She also expresses a desire to improve her knee condition and manage her weight more effectively.  Wanda Burns is here to discuss her progress with her obesity treatment plan. She is on the Category 1 Plan  and states she is following her eating plan approximately 75 % of the time. She states she is not exercising 0 minutes 0 times per week.  Fasting labs obtained today.  She was informed we would discuss her lab results at her next visit unless there is a critical issue that needs to be addressed sooner. She agreed to keep her next visit at the agreed upon time to discuss these results.   Fasting IC performed today.  REE today 1483- Previous IC 1469. Not a significant change in metabolism.  Discussed continuing Cat 1 plan to promote weight loss.   OBJECTIVE: Visit Diagnoses: Problem List Items Addressed This Visit     Depression   Vitamin D deficiency   Relevant Orders   VITAMIN D 25 Hydroxy (Vit-D Deficiency, Fractures)   Prediabetes   Relevant Orders   CMP14+EGFR   Hemoglobin A1c   Insulin, random   Pure hypercholesterolemia   Relevant Orders   Lipid Panel With LDL/HDL Ratio   Generalized obesity- Start BMI 32.61   SOB (shortness of breath) on exertion - Primary   Relevant Orders   Vitamin B12   BMI 31.0-31.9,adult Current BMI 31.6   Other Visit Diagnoses       Knee pain, unspecified chronicity, unspecified laterality         Obesity Obesity with slight improvement in RMR from 1469 to 1483 calories/day. On a calorie deficit diet (1000-1100 calories/day) with 75 grams of protein. Reports increased water weight and difficulty controlling cravings for sweets, particularly during the summer. Discussed strategies to manage cravings and maintain dietary discipline. Prefers not to use diuretics for water retention. - Continue current calorie deficit diet (1000-1100 calories/day) with 75  grams of protein - Encourage use of pre-prepared protein-rich meals like [employer] meat products - Advise to avoid bringing sweets into the house - Recommend eating before grocery shopping to avoid impulse buys - Monitor sodium intake to manage water retention  Prediabetes Lab Results  Component  Value Date   HGBA1C 5.8 11/20/2022   HGBA1C 5.9 (H) 03/17/2018   HGBA1C 6.2 (H) 12/09/2017   Lab Results  Component Value Date   LDLCALC 119 11/20/2022   LDLCALC 119 11/20/2022   CREATININE 0.9 11/20/2022    Prediabetes managed with metformin 500 mg BID. Discussed the importance of maintaining dietary discipline to manage blood glucose levels. - Continue metformin 500 mg BID - Check fasting blood glucose and HbA1c levels and CMET and B 12 level with metformin use.   Hypercholesterolemia The 10-year ASCVD risk score (Arnett DK, et al., 2019) is: 8.7%*   Values used to calculate the score:     Age: 31 years     Sex: Female     Is Non-Hispanic African American: No     Diabetic: No     Tobacco smoker: No     Systolic Blood Pressure: 124 mmHg     Is BP treated: No     HDL Cholesterol: 73 mg/dL     Total Cholesterol: 218 mg/dL*     * - Cholesterol units were assumed for this score calculation  Hypercholesterolemia with previous lipid panel showing borderline triglycerides, high HDL, and high LDL. Discussed the importance of dietary management and monitoring lipid levels. - Check lipid panel today  Depression Depression managed with Wellbutrin XL 300 mg daily and Celexa 10 mg daily. Reports feeling emotionally down and disgusted with weight gain. Discussed the importance of mental health in managing overall health. - Continue Wellbutrin XL 300 mg daily - Continue Celexa 10 mg daily Check B 12 level   Knee Pain Reports knee pain with swelling and difficulty pivoting at work. Suspected arthritis but not confirmed. Pain is severe enough to cause crying. Heat application provides relief. Plans to use physical therapy minutes during break and prefers not to use meloxicam frequently due to side effects. - Refer to primary care physician for evaluation and possible x-ray - Consider physical therapy for knee pain management - Use meloxicam sparingly for pain management - Apply heat as  needed for pain relief  General Health Maintenance Routine health maintenance including fasting labs and vitamin D monitoring. Previous CBC in September at [facility] was normal. - Check fasting chemistries including liver, kidneys, electrolytes, and insulin levels - Check vitamin D levels - Check B12 levels due to age and metformin use    Vitals Temp: 98.1 F (36.7 C) BP: 124/81 Pulse Rate: 71 SpO2: 100 %   Anthropometric Measurements Height: 5\' 4"  (1.626 m) Weight: 184 lb (83.5 kg) BMI (Calculated): 31.57 Weight at Last Visit: 179lb Weight Lost Since Last Visit: 0lb Weight Gained Since Last Visit: 5lb Starting Weight: 190 Total Weight Loss (lbs): 6 lb (2.722 kg) Peak Weight: 209 lb   Body Composition  Body Fat %: 42.7 % Fat Mass (lbs): 78.6 lbs Muscle Mass (lbs): 100.2 lbs Total Body Water (lbs): 74 lbs Visceral Fat Rating : 12   Other Clinical Data RMR: 1483 Fasting: yes Labs: yes Today's Visit #: 24 Starting Date: 05/16/21     ASSESSMENT AND PLAN:  Diet: Alica is currently in the action stage of change. As such, her goal is to continue with weight loss efforts. She has agreed  to Category 1 Plan.  Exercise: Samaiyah has been instructed that some exercise is better than none and exercise as able with knee pain  for weight loss and overall health benefits.   Behavior Modification:  We discussed the following Behavioral Modification Strategies today: increasing lean protein intake, decreasing simple carbohydrates, increasing vegetables, increase H2O intake, increase high fiber foods, no skipping meals, meal planning and cooking strategies, emotional eating strategies , holiday eating strategies, and planning for success. We discussed various medication options to help [place] with her weight loss efforts and we both agreed to continue metformin for primary indication of prediabetes and continue to work on nutritional and behavioral strategies to promote weight  loss.    Return in about 4 weeks (around 04/01/2023).Marland Kitchen She was informed of the importance of frequent follow up visits to maximize her success with intensive lifestyle modifications for her multiple health conditions.  Attestation Statements:   Reviewed by clinician on day of visit: allergies, medications, problem list, medical history, surgical history, family history, social history, and previous encounter notes.   Time spent on visit including pre-visit chart review and post-visit care and charting was *** minutes.    Yazlin Ekblad, PA-C

## 2023-03-04 ENCOUNTER — Encounter (INDEPENDENT_AMBULATORY_CARE_PROVIDER_SITE_OTHER): Payer: Self-pay | Admitting: Physician Assistant

## 2023-03-04 ENCOUNTER — Ambulatory Visit (INDEPENDENT_AMBULATORY_CARE_PROVIDER_SITE_OTHER): Payer: PPO | Admitting: Physician Assistant

## 2023-03-04 VITALS — BP 124/81 | HR 71 | Temp 98.1°F | Ht 64.0 in | Wt 184.0 lb

## 2023-03-04 DIAGNOSIS — E7800 Pure hypercholesterolemia, unspecified: Secondary | ICD-10-CM

## 2023-03-04 DIAGNOSIS — Z6831 Body mass index (BMI) 31.0-31.9, adult: Secondary | ICD-10-CM | POA: Insufficient documentation

## 2023-03-04 DIAGNOSIS — F3289 Other specified depressive episodes: Secondary | ICD-10-CM | POA: Diagnosis not present

## 2023-03-04 DIAGNOSIS — R7303 Prediabetes: Secondary | ICD-10-CM

## 2023-03-04 DIAGNOSIS — R948 Abnormal results of function studies of other organs and systems: Secondary | ICD-10-CM

## 2023-03-04 DIAGNOSIS — E559 Vitamin D deficiency, unspecified: Secondary | ICD-10-CM | POA: Diagnosis not present

## 2023-03-04 DIAGNOSIS — R0602 Shortness of breath: Secondary | ICD-10-CM | POA: Insufficient documentation

## 2023-03-04 DIAGNOSIS — M25569 Pain in unspecified knee: Secondary | ICD-10-CM

## 2023-03-04 DIAGNOSIS — E669 Obesity, unspecified: Secondary | ICD-10-CM

## 2023-03-05 LAB — LIPID PANEL WITH LDL/HDL RATIO
Cholesterol, Total: 233 mg/dL — ABNORMAL HIGH (ref 100–199)
HDL: 78 mg/dL (ref >39)
LDL Chol Calc (NIH): 133 mg/dL — ABNORMAL HIGH (ref 0–99)
LDL/HDL Ratio: 1.7 {ratio} (ref 0.0–3.2)
Triglycerides: 127 mg/dL (ref 0–149)
VLDL Cholesterol Cal: 22 mg/dL (ref 5–40)

## 2023-03-05 LAB — HEMOGLOBIN A1C
Est. average glucose Bld gHb Est-mCnc: 128 mg/dL
Hgb A1c MFr Bld: 6.1 % — ABNORMAL HIGH (ref 4.8–5.6)

## 2023-03-05 LAB — VITAMIN B12: Vitamin B-12: 426 pg/mL (ref 232–1245)

## 2023-03-05 LAB — CMP14+EGFR
ALT: 23 [IU]/L (ref 0–32)
AST: 19 [IU]/L (ref 0–40)
Albumin: 4.4 g/dL (ref 3.9–4.9)
Alkaline Phosphatase: 119 [IU]/L (ref 44–121)
BUN/Creatinine Ratio: 22 (ref 12–28)
BUN: 20 mg/dL (ref 8–27)
Bilirubin Total: <0.2 mg/dL (ref 0.0–1.2)
CO2: 23 mmol/L (ref 20–29)
Calcium: 9.5 mg/dL (ref 8.7–10.3)
Chloride: 102 mmol/L (ref 96–106)
Creatinine, Ser: 0.93 mg/dL (ref 0.57–1.00)
Globulin, Total: 2.7 g/dL (ref 1.5–4.5)
Glucose: 80 mg/dL (ref 70–99)
Potassium: 4.4 mmol/L (ref 3.5–5.2)
Sodium: 141 mmol/L (ref 134–144)
Total Protein: 7.1 g/dL (ref 6.0–8.5)
eGFR: 66 mL/min/{1.73_m2} (ref >59)

## 2023-03-05 LAB — VITAMIN D 25 HYDROXY (VIT D DEFICIENCY, FRACTURES): Vit D, 25-Hydroxy: 52 ng/mL (ref 30.0–100.0)

## 2023-03-05 LAB — INSULIN, RANDOM: INSULIN: 6.4 u[IU]/mL (ref 2.6–24.9)

## 2023-03-11 ENCOUNTER — Ambulatory Visit (INDEPENDENT_AMBULATORY_CARE_PROVIDER_SITE_OTHER): Payer: PPO | Admitting: Family Medicine

## 2023-03-24 ENCOUNTER — Other Ambulatory Visit (INDEPENDENT_AMBULATORY_CARE_PROVIDER_SITE_OTHER): Payer: Self-pay | Admitting: Internal Medicine

## 2023-03-24 DIAGNOSIS — F3289 Other specified depressive episodes: Secondary | ICD-10-CM

## 2023-04-01 ENCOUNTER — Other Ambulatory Visit (HOSPITAL_BASED_OUTPATIENT_CLINIC_OR_DEPARTMENT_OTHER): Payer: Self-pay

## 2023-04-01 ENCOUNTER — Encounter (INDEPENDENT_AMBULATORY_CARE_PROVIDER_SITE_OTHER): Payer: Self-pay | Admitting: Physician Assistant

## 2023-04-01 ENCOUNTER — Ambulatory Visit (INDEPENDENT_AMBULATORY_CARE_PROVIDER_SITE_OTHER): Payer: PPO | Admitting: Physician Assistant

## 2023-04-01 VITALS — BP 133/78 | HR 83 | Temp 98.5°F | Ht 64.0 in | Wt 187.0 lb

## 2023-04-01 DIAGNOSIS — M179 Osteoarthritis of knee, unspecified: Secondary | ICD-10-CM | POA: Diagnosis not present

## 2023-04-01 DIAGNOSIS — R7303 Prediabetes: Secondary | ICD-10-CM

## 2023-04-01 DIAGNOSIS — E559 Vitamin D deficiency, unspecified: Secondary | ICD-10-CM | POA: Diagnosis not present

## 2023-04-01 DIAGNOSIS — F3289 Other specified depressive episodes: Secondary | ICD-10-CM

## 2023-04-01 DIAGNOSIS — F5089 Other specified eating disorder: Secondary | ICD-10-CM

## 2023-04-01 DIAGNOSIS — E669 Obesity, unspecified: Secondary | ICD-10-CM

## 2023-04-01 DIAGNOSIS — Z6832 Body mass index (BMI) 32.0-32.9, adult: Secondary | ICD-10-CM

## 2023-04-01 MED ORDER — SEMAGLUTIDE 3 MG PO TABS
3.0000 mg | ORAL_TABLET | Freq: Every day | ORAL | 0 refills | Status: DC
  Filled 2023-04-01: qty 30, 30d supply, fill #0

## 2023-04-01 NOTE — Progress Notes (Signed)
SUBJECTIVE:  Chief Complaint: Obesity  Interim History: She is up 3 lbs since her last visit.  Bio impedence scale reviewed with the patient:  Muscle mass + 2.8 lbs Adipose mass 0 change Total body water -1.8 lbs  Wanda Burns is a 73 year old femalae with a history of obesity, prediabetes, hypercholesterolemia, and vitamin D deficiency. She has been experiencing increased difficulty with mobility due to knee pain. Despite these challenges, the patient has been attempting to maintain an exercise regimen, including chair exercises and wearing knee braces for support.  The patient reports struggling with sugar cravings, which she attributes to recent weight gain. She has been indulging in sweet treats, such as cake, a few times a week. Despite this, she has made significant dietary changes overall, including avoiding junk food, eating out less, and substituting bread with English muffins when she does eat bread. She expresses frustration with her inability to resist these cravings and the subsequent impact on her weight and prediabetes management.   The patient has been taking metformin for her prediabetes and reports no issues with this medication. She also takes Celexa and Wellbutrin daily. She has previously tried topiramate for weight loss, but discontinued due to mood changes. She expresses interest in trying a new medication to help manage her sugar cravings and promote weight loss.  In addition to her struggles with weight and diet, the patient has been dealing with worsening knee pain. She has consulted with an orthopedic specialist who suggested surgery or steroid injections as potential treatments. However, the patient is reluctant to pursue these options at this time, preferring to manage the pain with meloxicam and Voltaren cream. She is also using knee braces for support and is considering knee replacement surgery in the future.  Wanda Burns is here to discuss her progress with her obesity  treatment plan. She is on the Category 1 Plan and states she is following her eating plan approximately 70 % of the time. She states she is exercising chair exercises 10 minutes 2 times per week.  We discussed other options for Prediabetes management as her A1c has been worsening and which would also promote weight loss. We discussed use of Rybelsus including possible risks, benefits and side effects.  We have reviewed the risks and benefits of using a GLP-1. The patient denies a personal or family history of medullary thyroid cancer or MENII. The patient denies a history of pancreatitis. The potential risks and benefits of this GLP-1 were reviewed with the patient, and alternative treatment options were discussed. All questions were answered, and the patient wishes to move forward with this medication.  Pharmacotherapy: metformin for prediabetes   Topiramate caused mood changes.    Celexa for emotional eating. Was already taking Wellbutrin.   OBJECTIVE: Visit Diagnoses: Problem List Items Addressed This Visit     Depression   Vitamin D deficiency   Prediabetes - Primary   Relevant Medications   Semaglutide 3 MG TABS   Pure hypercholesterolemia   Generalized obesity- Start BMI 32.61   Relevant Medications   Semaglutide 3 MG TABS   Other Visit Diagnoses       BMI 32.0-32.9,adult Current BMI 32.1         Obesity Wanda Burns, a 73 year old with obesity, prediabetes, hypercholesterolemia, and vitamin D deficiency, has gained three pounds due to holiday sugar cravings but increased muscle mass from 100.2 lbs to 103 lbs. She is managing cravings by avoiding junk food and eating healthier snacks. She is incorporating chair exercises  due to knee pain. Discussed Rybelsus for increased A1C as well as to promote further weight loss, including side effects (nausea, vomiting, diarrhea, constipation) and administration (30 minutes before breakfast with a small sip of water). Discussed switching  metformin to the evening to reduce evening hunger. - Prescribe Rybelsus 3 mg tablets once daily, 30 minutes before breakfast with a small sip of water - Switch metformin to once daily with the evening meal - Encourage continued chair exercises - Recommend high-protein snacks such as [place] yogurt, drinkable yogurt (Too Good), and protein shakes (Premier)  Prediabetes Lab Results  Component Value Date   HGBA1C 6.1 (H) 03/04/2023   HGBA1C 5.8 11/20/2022   HGBA1C 5.9 (H) 03/17/2018   Lab Results  Component Value Date   LDLCALC 133 (H) 03/04/2023   CREATININE 0.93 03/04/2023   A1c is worsening.  Wanda Burns is managing prediabetes with diet and metformin.  The introduction of Rybelsus may aid in weight loss and better glycemic control. - Continue metformin, switching to once daily with the evening meal - Start Rybelsus 3 mg tablets once daily, 30 minutes before breakfast with a small sip of water Continue working on nutrition plan to decrease simple carbohydrates, increase lean proteins and exercise to promote weight loss, improve glycemic control and prevent progression to Type 2 diabetes.    Knee Osteoarthritis Wanda Burns experiences knee pain limiting mobility, managed with knee braces and chair exercises. She prefers medication and braces over surgery or steroid injections. Discussed meloxicam and Voltaren cream for pain relief. Potential knee replacement surgery in summer, including post-surgery rehabilitation, was discussed. - Continue meloxicam as needed - Use Voltaren cream for pain relief - Continue using knee braces - Plan for potential knee replacement surgery during the summer, considering post-surgery rehabilitation options  Mood disorder/emotional eating Wanda Burns has had issues with stress/emotional eating. Currently this is moderately controlled. Overall mood is stable. Medication(s): Bupropion XL 300 mg daily and Other: Celexa 10 mg daily  Reports no side effects with medications.    Plan: Continue Bupropion XL 300 mg daily and Other: Celexa 10 mg daily - No refills needed this visit.  Continue to work on emotional eating strategies.    General Health Maintenance Wanda Burns is proactive about diet and exercise, managing weight and cravings, and maintaining muscle mass through chair exercises. - Encourage continued healthy eating habits and high-protein snacks - Continue chair exercises to maintain muscle mass and overall fitness - Monitor and manage chronic conditions such as prediabetes, hypercholesterolemia, and vitamin D deficiency  Follow-up - Follow up in 1 month to assess the effectiveness of Rybelsus and adjust dosage if needed - Monitor knee pain and consider knee replacement surgery during the summer - Continue regular follow-ups for chronic conditions and general health maintenance.  Vitals Temp: 98.5 F (36.9 C) BP: 133/78 Pulse Rate: 83 SpO2: 98 %   Anthropometric Measurements Height: 5\' 4"  (1.626 m) Weight: 187 lb (84.8 kg) BMI (Calculated): 32.08 Weight at Last Visit: 184 lb Weight Lost Since Last Visit: 0 Weight Gained Since Last Visit: 3 lb Starting Weight: 190 Total Weight Loss (lbs): 3 lb (1.361 kg) Peak Weight: 209   Body Composition  Body Fat %: 42 % Fat Mass (lbs): 78.6 lbs Muscle Mass (lbs): 103 lbs Total Body Water (lbs): 72.2 lbs Visceral Fat Rating : 12   Other Clinical Data RMR: 1483 Fasting: no Labs: no Today's Visit #: 25 Starting Date: 05/16/21     ASSESSMENT AND PLAN:  Diet: Wanda Burns is currently in the  action stage of change. As such, her goal is to continue with weight loss efforts. She has agreed to Category 1 Plan.  Exercise: Wanda Burns has been instructed to try a geriatric exercise plan for weight loss and overall health benefits.   Behavior Modification:  We discussed the following Behavioral Modification Strategies today: increasing lean protein intake, decreasing simple carbohydrates, increasing  vegetables, increase H2O intake, increase high fiber foods, no skipping meals, meal planning and cooking strategies, better snacking choices, avoiding temptations, and planning for success. We discussed various medication options to help Wanda Burns with her weight loss efforts and we both agreed to start Rybelsus for prediabetes and continue to work on nutritional and behavioral strategies to promote weight loss.  .  Return in about 4 weeks (around 04/29/2023).Marland Kitchen She was informed of the importance of frequent follow up visits to maximize her success with intensive lifestyle modifications for her multiple health conditions.  Attestation Statements:   Reviewed by clinician on day of visit: allergies, medications, problem list, medical history, surgical history, family history, social history, and previous encounter notes.   Time spent on visit including pre-visit chart review and post-visit care and charting was 39 minutes.    Wanda Gibbon, PA-C

## 2023-04-02 ENCOUNTER — Other Ambulatory Visit (HOSPITAL_BASED_OUTPATIENT_CLINIC_OR_DEPARTMENT_OTHER): Payer: Self-pay

## 2023-04-03 ENCOUNTER — Other Ambulatory Visit (HOSPITAL_BASED_OUTPATIENT_CLINIC_OR_DEPARTMENT_OTHER): Payer: Self-pay

## 2023-04-03 MED ORDER — METFORMIN HCL ER 500 MG PO TB24
500.0000 mg | ORAL_TABLET | Freq: Every day | ORAL | 1 refills | Status: DC
  Filled 2023-04-03 – 2023-04-09 (×3): qty 90, 90d supply, fill #0

## 2023-04-03 MED ORDER — RYBELSUS 3 MG PO TABS
3.0000 mg | ORAL_TABLET | Freq: Every day | ORAL | 0 refills | Status: DC
  Filled 2023-04-03 – 2023-04-09 (×2): qty 30, 30d supply, fill #0

## 2023-04-09 ENCOUNTER — Telehealth: Payer: Self-pay

## 2023-04-09 ENCOUNTER — Other Ambulatory Visit (HOSPITAL_BASED_OUTPATIENT_CLINIC_OR_DEPARTMENT_OTHER): Payer: Self-pay

## 2023-04-09 ENCOUNTER — Other Ambulatory Visit: Payer: Self-pay

## 2023-04-09 NOTE — Telephone Encounter (Signed)
PA submitted through Cover My Meds for Rybelsus. Awaiting insurance determination. Key: [id number]

## 2023-04-12 ENCOUNTER — Other Ambulatory Visit (HOSPITAL_BASED_OUTPATIENT_CLINIC_OR_DEPARTMENT_OTHER): Payer: Self-pay

## 2023-04-13 ENCOUNTER — Other Ambulatory Visit (HOSPITAL_BASED_OUTPATIENT_CLINIC_OR_DEPARTMENT_OTHER): Payer: Self-pay

## 2023-04-14 ENCOUNTER — Telehealth (INDEPENDENT_AMBULATORY_CARE_PROVIDER_SITE_OTHER): Payer: Self-pay | Admitting: Physician Assistant

## 2023-04-14 NOTE — Telephone Encounter (Signed)
Patient stated that Prior Berkley Harvey is still not completed from 01/21 visit for Rybelus.

## 2023-04-15 ENCOUNTER — Telehealth (INDEPENDENT_AMBULATORY_CARE_PROVIDER_SITE_OTHER): Payer: Self-pay | Admitting: *Deleted

## 2023-04-15 ENCOUNTER — Encounter (INDEPENDENT_AMBULATORY_CARE_PROVIDER_SITE_OTHER): Payer: Self-pay

## 2023-04-15 NOTE — Telephone Encounter (Signed)
Medication(Rybelsus) denied by insurance, patient has been updated thru MyChart.

## 2023-04-19 ENCOUNTER — Other Ambulatory Visit (HOSPITAL_BASED_OUTPATIENT_CLINIC_OR_DEPARTMENT_OTHER): Payer: Self-pay

## 2023-04-24 ENCOUNTER — Other Ambulatory Visit (HOSPITAL_BASED_OUTPATIENT_CLINIC_OR_DEPARTMENT_OTHER): Payer: Self-pay

## 2023-04-29 ENCOUNTER — Ambulatory Visit (INDEPENDENT_AMBULATORY_CARE_PROVIDER_SITE_OTHER): Payer: HMO | Admitting: Physician Assistant

## 2023-04-29 ENCOUNTER — Encounter (INDEPENDENT_AMBULATORY_CARE_PROVIDER_SITE_OTHER): Payer: Self-pay | Admitting: Physician Assistant

## 2023-04-29 VITALS — BP 115/76 | HR 64 | Temp 97.4°F | Ht 64.0 in | Wt 189.0 lb

## 2023-04-29 DIAGNOSIS — F32A Depression, unspecified: Secondary | ICD-10-CM

## 2023-04-29 DIAGNOSIS — M179 Osteoarthritis of knee, unspecified: Secondary | ICD-10-CM

## 2023-04-29 DIAGNOSIS — E559 Vitamin D deficiency, unspecified: Secondary | ICD-10-CM

## 2023-04-29 DIAGNOSIS — R7303 Prediabetes: Secondary | ICD-10-CM | POA: Diagnosis not present

## 2023-04-29 DIAGNOSIS — E669 Obesity, unspecified: Secondary | ICD-10-CM

## 2023-04-29 DIAGNOSIS — Z6832 Body mass index (BMI) 32.0-32.9, adult: Secondary | ICD-10-CM

## 2023-04-29 DIAGNOSIS — F3289 Other specified depressive episodes: Secondary | ICD-10-CM

## 2023-04-29 NOTE — Progress Notes (Signed)
 SUBJECTIVE: Discussed the use of AI scribe software for clinical note transcription with the patient, who gave verbal consent to proceed.  Chief Complaint: Obesity  Interim History: She is up 2 lbs since her last visit.   Wanda Burns is here to discuss her progress with her obesity treatment plan. She is on the Category 1 Plan and states she is following her eating plan approximately 8 % of the time. She states she is exercising yardwork/working on job Sit Fit exercises 10 minutes 3 times per week. Wanda Burns is a 73 year old female with obesity who presents for follow-up of her obesity treatment plan.  She feels discouraged due to weight gain despite efforts to manage her condition. Insurance has not approved Rybelsus for her prediabetes, which adds to her frustration. She is currently taking metformin and bupropion. Emotional eating and lack of motivation are contributing factors to her weight management challenges.  Her physical activity is limited by knee osteoarthritis, which causes her to 'take it slow.' Despite this, she engages in outdoor work when weather permits and remains active at her job, although she notes sitting more than moving. She feels tired after work and lacks motivation for additional activities.  She experiences feelings of burnout and frustration, particularly since the summer, acknowledging self-sabotage in her weight loss journey. She feels mentally exhausted and lacks interest in activities she used to enjoy, such as going to the store or movies. She has distanced herself from her church community due to disagreements and has experienced the loss of several close friends, impacting her social life. 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  She is taking vitamin D over the counter at a dose of 2000 IU daily, having reduced from 5000 IU due to high levels. She is  not currently taking vitamin B12 supplements, although her B12 level was 426 in December. She reports low energy levels and a lack of motivation, attributing this more to mental fatigue than physical tiredness.  OBJECTIVE: Visit Diagnoses: Problem List Items Addressed This Visit     Depression   Vitamin D deficiency   Prediabetes - Primary   Generalized obesity- Start BMI 32.61   Other Visit Diagnoses       Osteoarthritis of knee, unspecified laterality, unspecified osteoarthritis type         Obesity Obesity with emotional eating and recent weight gain. Reports decreased physical activity due to knee pain and lack of energy. Discussed finding enjoyable activities to improve motivation and mental health. - Continue current weight management plan - Encourage finding an enjoyable activity weekly  - Follow up in four weeks  Depression Depression with low motivation, lack of interest, and social withdrawal. Currently on bupropion but reports it may not be effective. Discussed potential medication adjustment and importance of counseling or therapy. On Wellbutrin and Celexa, but does not feel Celexa is helpful.  - Discuss potential medication adjustment with primary care physician She has follow up with her PCP scheduled for March  - Encourage seeking counseling or therapy  Osteoarthritis of the Knee Significant osteoarthritis of the knee contributing to decreased mobility. Discussed importance of low-impact activities. - Encourage low-impact activities  Prediabetes Prediabetes managed with metformin. Insurance denied coverage for Rybelsus.  - Continue metformin Continue working on nutrition plan to decrease simple carbohydrates, increase lean proteins and exercise to promote weight loss, improve glycemic control and prevent progression to Type 2  diabetes.    Vitamin D Deficiency Vitamin D deficiency managed with over-the-counter vitamin D 2000 IU daily. Previously took 5000 IU but  reduced due to high levels. Last vitamin D Lab Results  Component Value Date   VD25OH 52.0 03/04/2023   Low vitamin D levels can be associated with adiposity and may result in leptin resistance and weight gain. Also associated with fatigue.  Currently on vitamin D supplementation without any adverse effects such as nausea, vomiting or muscle weakness.  - Continue vitamin D 2000 IU daily  Vitamin B12 low serum levels  Potential vitamin B12 deficiency contributing to low energy levels. Not currently taking supplements. Discussed benefits of vitamin B12 supplementation. Goal level > 500 - Start vitamin B12 500 mcg sublingual once daily  General Health Maintenance Discussed importance of social support and engaging in enjoyable activities for overall health and longevity. - Encourage reconnecting with social activities and community involvement  Follow-up - Follow up in four weeks on May 27, 2023 at 2:00 PM.  Vitals Temp: (!) 97.4 F (36.3 C) BP: 115/76 Pulse Rate: 64 SpO2: 95 %   Anthropometric Measurements Height: 5\' 4"  (1.626 m) Weight: 189 lb (85.7 kg) BMI (Calculated): 32.43 Weight at Last Visit: 187lb Weight Lost Since Last Visit: 0 Weight Gained Since Last Visit: 2lb Starting Weight: 190lb Total Weight Loss (lbs): 1 lb (0.454 kg) Peak Weight: 209lb   Body Composition  Body Fat %: 42.9 % Fat Mass (lbs): 81.2 lbs Muscle Mass (lbs): 102.6 lbs Total Body Water (lbs): 73.8 lbs Visceral Fat Rating : 13   Other Clinical Data Fasting: no Labs: no Today's Visit #: 26 Starting Date: 05/16/21     ASSESSMENT AND PLAN:  Diet: Wanda Burns is currently in the action stage of change. As such, her goal is to continue with weight loss efforts. She has agreed to Category 1 Plan.  Exercise: Wanda Burns has been instructed to continue exercising as is for weight loss and overall health benefits.   Behavior Modification:  We discussed the following Behavioral Modification  Strategies today: increasing lean protein intake, decreasing simple carbohydrates, increasing vegetables, increase H2O intake, increase high fiber foods, meal planning and cooking strategies, ways to avoid boredom eating, better snacking choices, emotional eating strategies , avoiding temptations, and planning for success. We discussed various medication options to help Wanda Burns with her weight loss efforts and we both agreed to continue metformin for primary indication of prediabetes and continue to work on nutritional and behavioral strategies to promote weight loss.  .  Return in about 4 weeks (around 05/27/2023).Marland Kitchen She was informed of the importance of frequent follow up visits to maximize her success with intensive lifestyle modifications for her multiple health conditions.  Attestation Statements:   Reviewed by clinician on day of visit: allergies, medications, problem list, medical history, surgical history, family history, social history, and previous encounter notes.   Time spent on visit including pre-visit chart review and post-visit care and charting was 44 minutes.    Wanda Taylor, PA-C

## 2023-04-30 ENCOUNTER — Other Ambulatory Visit (HOSPITAL_BASED_OUTPATIENT_CLINIC_OR_DEPARTMENT_OTHER): Payer: Self-pay

## 2023-05-20 ENCOUNTER — Other Ambulatory Visit (HOSPITAL_BASED_OUTPATIENT_CLINIC_OR_DEPARTMENT_OTHER): Payer: Self-pay

## 2023-05-20 MED ORDER — BENZONATATE 100 MG PO CAPS
100.0000 mg | ORAL_CAPSULE | Freq: Three times a day (TID) | ORAL | 0 refills | Status: DC | PRN
Start: 2023-05-20 — End: 2023-08-11
  Filled 2023-05-20: qty 60, 10d supply, fill #0

## 2023-05-20 MED ORDER — AMOXICILLIN-POT CLAVULANATE 875-125 MG PO TABS
1.0000 | ORAL_TABLET | Freq: Two times a day (BID) | ORAL | 0 refills | Status: AC
  Filled 2023-05-20: qty 20, 10d supply, fill #0

## 2023-05-26 ENCOUNTER — Other Ambulatory Visit (HOSPITAL_BASED_OUTPATIENT_CLINIC_OR_DEPARTMENT_OTHER): Payer: Self-pay

## 2023-05-26 MED ORDER — LORAZEPAM 1 MG PO TABS
0.5000 mg | ORAL_TABLET | Freq: Two times a day (BID) | ORAL | 1 refills | Status: DC | PRN
  Filled 2023-05-26: qty 135, 68d supply, fill #0
  Filled 2023-08-27: qty 135, 68d supply, fill #1

## 2023-05-26 MED ORDER — BUPROPION HCL ER (XL) 300 MG PO TB24
300.0000 mg | ORAL_TABLET | Freq: Every morning | ORAL | 3 refills | Status: AC
  Filled 2023-05-26: qty 90, 90d supply, fill #0
  Filled 2023-08-21: qty 90, 90d supply, fill #1
  Filled 2023-12-09: qty 90, 90d supply, fill #2
  Filled 2024-02-27: qty 90, 90d supply, fill #3

## 2023-05-26 NOTE — Progress Notes (Unsigned)
 SUBJECTIVE: Discussed the use of AI scribe software for clinical note transcription with the patient, who gave verbal consent to proceed.  Chief Complaint: Obesity  Interim History: She is down 1 lb since her last visit. Muscle mass +1.8 lbs Adipose mass -2.6 lbs Total body water -1.8 lbs  Wanda Burns is here to discuss her progress with her obesity treatment plan. She is on the Category 1 Plan and states she is following her eating plan approximately 90 % of the time. She states she is exercising on the job walking/lifting 20+ minutes 6 times per week. Wanda Burns is a 73 year old female with obesity who presents for follow-up of her obesity treatment plan.  She notes a weight change of one pound, with an increase in muscle mass and a decrease in adipose mass.    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 She is pleased with her progress and is motivated to continue her efforts.  She has a history of prediabetes and is currently on metformin 500 mg twice daily. Recent blood work showed a slight increase in glucose and alkaline phosphatase levels, but her triglycerides, HDL, and LDL levels are improved. Her A1c remains in the prediabetic range.  She has a history of depression. She is currently on Celexa and Wellbutrin. She is working with her PCP to adjust her medication dosage to better manage her symptoms. She reports feeling 'happy' and able to 'laugh and cry,' indicating a stable mood at present. No SI or other concerns.   She has osteoarthritis and reports that her knee is 'doing pretty good.' She uses a brace and is careful with her movements to prevent further injury. She finds that warmer weather helps her joints feel better.  She is recovering from a sinus infection and is on antibiotics, with four days remaining in her course. She attributes the infection to high  pollen levels.  She is currently taking vitamin D 2000 IU daily and B12 supplements, which she feels have improved her energy levels. She reports not feeling as tired after work as she used to. 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 She enjoys spending time outdoors and is planning to have an extension built on her deck to enjoy more outdoor activities.  She saw her PCP at [facility] and had follow up labs done 05/21/23 OBJECTIVE: Visit Diagnoses: Problem List Items Addressed This Visit     Depression   Vitamin D deficiency   Prediabetes - Primary   Generalized obesity- Start BMI 32.61   Other Visit Diagnoses       Osteoarthritis of knee, unspecified laterality, unspecified osteoarthritis type         BMI 32.0-32.9,adult Current BMI 32.3         Obesity She has lost weight, gained muscle mass, and reduced adipose tissue through proper nutrition and physical activity, particularly at work. She is pleased with her progress and feeling more positive about continuing her program.  - Continue current diet and exercise regimen- Cat. 1 plan - Follow up in 3 weeks to monitor progress  Depression She reports slight depression despite Celexa and Wellbutrin.  She is considering increasing the Celexa dosage to 20 mg to improve symptoms, aware of potential side effects like feeling sedated or flat affect.  - Adjust Celexa dosage to 20 mg and monitor response per patient and  PCP's discussion and PCP to manage dosage.  Monitor for SE such as flattened affect or other concerns.   Osteoarthritis She manages knee osteoarthritis with a brace and proper body mechanics. Warm weather improves joint function. - Continue using knee brace and practicing proper body mechanics - Consider warm weather activities to alleviate joint symptoms  Prediabetes Her A1c remains in the prediabetic range at 5.8 but much improved from last A1c here at 6.1.  On Metformin 500 mg  twice daily initially aided weight loss but has plateaued. She is building muscle and losing fat, which is positive. Lab Results  Component Value Date   HGBA1C 6.1 (H) 03/04/2023   Continue working on nutrition plan to decrease simple carbohydrates, increase lean proteins and exercise to promote weight loss, improve glycemic control and prevent progression to Type 2 diabetes.  - Continue metformin 500 mg twice daily - Monitor blood glucose levels and A1c  Vitamin D Deficiency Vitamin D levels are within target range (62.7 ng/mL) with 2000 IU daily supplementation with labs at [facility] this week. No N/V or muscle weakness with OTC vitamin D - Continue vitamin D supplementation at 2000 IU daily Low vitamin D levels can be associated with adiposity and may result in leptin resistance and weight gain. Also associated with fatigue.  Currently on vitamin D supplementation without any adverse effects such as nausea, vomiting or muscle weakness.   Sinus Infection She is on antibiotics for a sinus infection, with four days remaining. High pollen levels likely exacerbate the infection. - Complete current course of antibiotics - Consider using a mask outdoors to reduce pollen exposure  General Health Maintenance B12 supplementation has improved her energy levels. She is mindful of overall health and lifestyle habits. - Continue B12 supplementation - Encourage regular physical activity and healthy lifestyle choices  Vitals Temp: 98 F (36.7 C) BP: 112/72 Pulse Rate: 85 SpO2: 94 %   Anthropometric Measurements Height: 5\' 4"  (1.626 m) Weight: 188 lb (85.3 kg) BMI (Calculated): 32.25 Weight at Last Visit: 186 lb Weight Lost Since Last Visit: 1 lb Weight Gained Since Last Visit: 0 Starting Weight: 190 lb Total Weight Loss (lbs): 2 lb (0.907 kg) Peak Weight: 209 lb   Body Composition  Body Fat %: 41.8 % Fat Mass (lbs): 78.6 lbs Muscle Mass (lbs): 103.8 lbs Total Body Water (lbs): 72  lbs Visceral Fat Rating : 12   Other Clinical Data RMR: [id number] Fasting: no Labs: no Today's Visit #: 27 Starting Date: 05/16/21     ASSESSMENT AND PLAN:  Diet: Wanda Burns is currently in the action stage of change. As such, her goal is to continue with weight loss efforts and has agreed to the Category 1 Plan.   Exercise:  Older adults should do exercises that maintain or improve balance if they are at risk of falling.  and Older adults should determine their level of effort for physical activity relative to their level of fitness.  Behavior Modification:  We discussed the following Behavioral Modification Strategies today: increasing lean protein intake, decreasing simple carbohydrates, increasing vegetables, increase H2O intake, increase high fiber foods, meal planning and cooking strategies, emotional eating strategies , avoiding temptations, and planning for success. We discussed various medication options to help Wanda Burns with her weight loss efforts and we both agreed to continue metformin for primary indication of prediabetes and continue to work on nutritional and behavioral strategies to promote weight loss.  .  Return in about 3 weeks (around 06/17/2023).Marland Kitchen She was informed of  the importance of frequent follow up visits to maximize her success with intensive lifestyle modifications for her multiple health conditions.  Attestation Statements:   Reviewed by clinician on day of visit: allergies, medications, problem list, medical history, surgical history, family history, social history, and previous encounter notes.   Time spent on visit including pre-visit chart review and post-visit care and charting was 32 minutes  Demond Shallenberger,PA-C

## 2023-05-27 ENCOUNTER — Ambulatory Visit (INDEPENDENT_AMBULATORY_CARE_PROVIDER_SITE_OTHER): Payer: HMO | Admitting: Physician Assistant

## 2023-05-27 ENCOUNTER — Encounter (INDEPENDENT_AMBULATORY_CARE_PROVIDER_SITE_OTHER): Payer: Self-pay | Admitting: Physician Assistant

## 2023-05-27 VITALS — BP 112/72 | HR 85 | Temp 98.0°F | Ht 64.0 in | Wt 188.0 lb

## 2023-05-27 DIAGNOSIS — E669 Obesity, unspecified: Secondary | ICD-10-CM

## 2023-05-27 DIAGNOSIS — M179 Osteoarthritis of knee, unspecified: Secondary | ICD-10-CM

## 2023-05-27 DIAGNOSIS — E559 Vitamin D deficiency, unspecified: Secondary | ICD-10-CM | POA: Diagnosis not present

## 2023-05-27 DIAGNOSIS — R7303 Prediabetes: Secondary | ICD-10-CM

## 2023-05-27 DIAGNOSIS — F32A Depression, unspecified: Secondary | ICD-10-CM | POA: Diagnosis not present

## 2023-05-27 DIAGNOSIS — F3289 Other specified depressive episodes: Secondary | ICD-10-CM

## 2023-05-27 DIAGNOSIS — Z6832 Body mass index (BMI) 32.0-32.9, adult: Secondary | ICD-10-CM

## 2023-06-02 ENCOUNTER — Other Ambulatory Visit (HOSPITAL_BASED_OUTPATIENT_CLINIC_OR_DEPARTMENT_OTHER): Payer: Self-pay

## 2023-06-02 MED ORDER — CITALOPRAM HYDROBROMIDE 20 MG PO TABS
20.0000 mg | ORAL_TABLET | Freq: Every day | ORAL | 3 refills | Status: AC
  Filled 2023-06-02: qty 90, 90d supply, fill #0
  Filled 2023-08-27: qty 90, 90d supply, fill #1
  Filled 2023-12-02: qty 90, 90d supply, fill #2
  Filled 2024-04-16: qty 90, 90d supply, fill #3

## 2023-06-17 ENCOUNTER — Ambulatory Visit (INDEPENDENT_AMBULATORY_CARE_PROVIDER_SITE_OTHER): Admitting: Physician Assistant

## 2023-06-17 ENCOUNTER — Encounter (INDEPENDENT_AMBULATORY_CARE_PROVIDER_SITE_OTHER): Payer: Self-pay

## 2023-06-18 ENCOUNTER — Ambulatory Visit (INDEPENDENT_AMBULATORY_CARE_PROVIDER_SITE_OTHER): Admitting: Physician Assistant

## 2023-06-30 ENCOUNTER — Telehealth (INDEPENDENT_AMBULATORY_CARE_PROVIDER_SITE_OTHER): Payer: Self-pay

## 2023-06-30 ENCOUNTER — Encounter (INDEPENDENT_AMBULATORY_CARE_PROVIDER_SITE_OTHER): Payer: Self-pay

## 2023-06-30 NOTE — Telephone Encounter (Signed)
 Fax from [facility] asking for patient labs and last chart notes.  Paperwork, last patient notes and labs faxed over to [phone].  Awaiting response.

## 2023-07-07 ENCOUNTER — Other Ambulatory Visit (HOSPITAL_BASED_OUTPATIENT_CLINIC_OR_DEPARTMENT_OTHER): Payer: Self-pay

## 2023-07-07 MED ORDER — METFORMIN HCL ER 500 MG PO TB24
500.0000 mg | ORAL_TABLET | Freq: Every day | ORAL | 1 refills | Status: AC
  Filled 2023-07-07: qty 90, 90d supply, fill #0
  Filled 2023-10-01: qty 90, 90d supply, fill #1

## 2023-07-07 NOTE — Telephone Encounter (Signed)
 Fax from [employer].  Rybelsus  was denied because drug is not prescribed for a medically accepted indication. Medication is indicated for type 2 diabetes mellitus.

## 2023-07-21 ENCOUNTER — Ambulatory Visit (INDEPENDENT_AMBULATORY_CARE_PROVIDER_SITE_OTHER): Admitting: Physician Assistant

## 2023-08-08 ENCOUNTER — Other Ambulatory Visit (HOSPITAL_BASED_OUTPATIENT_CLINIC_OR_DEPARTMENT_OTHER): Payer: Self-pay

## 2023-08-08 MED ORDER — DOXYCYCLINE HYCLATE 100 MG PO TABS
100.0000 mg | ORAL_TABLET | Freq: Two times a day (BID) | ORAL | 0 refills | Status: AC
  Filled 2023-08-08: qty 14, 7d supply, fill #0

## 2023-08-08 MED ORDER — VALACYCLOVIR HCL 1 G PO TABS
ORAL_TABLET | ORAL | 0 refills | Status: AC
  Filled 2023-08-08: qty 21, 7d supply, fill #0

## 2023-08-11 ENCOUNTER — Other Ambulatory Visit (HOSPITAL_BASED_OUTPATIENT_CLINIC_OR_DEPARTMENT_OTHER): Payer: Self-pay

## 2023-08-11 MED ORDER — TRAMADOL HCL 50 MG PO TABS
50.0000 mg | ORAL_TABLET | Freq: Four times a day (QID) | ORAL | 0 refills | Status: DC | PRN
  Filled 2023-08-11: qty 12, 3d supply, fill #0

## 2023-08-25 ENCOUNTER — Other Ambulatory Visit (HOSPITAL_BASED_OUTPATIENT_CLINIC_OR_DEPARTMENT_OTHER): Payer: Self-pay

## 2023-08-25 MED ORDER — TRETINOIN 0.025 % EX CREA
1.0000 | TOPICAL_CREAM | Freq: Every day | CUTANEOUS | 3 refills | Status: AC
  Filled 2023-08-25: qty 20, 30d supply, fill #0
  Filled 2023-10-01: qty 20, 30d supply, fill #1
  Filled 2024-02-27: qty 20, 30d supply, fill #2

## 2023-08-27 ENCOUNTER — Other Ambulatory Visit: Payer: Self-pay

## 2023-10-02 ENCOUNTER — Other Ambulatory Visit (HOSPITAL_BASED_OUTPATIENT_CLINIC_OR_DEPARTMENT_OTHER): Payer: Self-pay

## 2023-12-02 ENCOUNTER — Other Ambulatory Visit (HOSPITAL_BASED_OUTPATIENT_CLINIC_OR_DEPARTMENT_OTHER): Payer: Self-pay

## 2023-12-02 MED ORDER — BENZONATATE 100 MG PO CAPS
100.0000 mg | ORAL_CAPSULE | Freq: Three times a day (TID) | ORAL | 3 refills | Status: AC | PRN
  Filled 2023-12-02: qty 60, 10d supply, fill #0
  Filled 2024-02-27: qty 60, 10d supply, fill #1
  Filled 2024-04-16: qty 60, 10d supply, fill #2

## 2023-12-09 ENCOUNTER — Other Ambulatory Visit (HOSPITAL_BASED_OUTPATIENT_CLINIC_OR_DEPARTMENT_OTHER): Payer: Self-pay

## 2023-12-09 MED ORDER — PANTOPRAZOLE SODIUM 40 MG PO TBEC
40.0000 mg | DELAYED_RELEASE_TABLET | Freq: Every morning | ORAL | 3 refills | Status: AC
  Filled 2023-12-09 (×2): qty 30, 30d supply, fill #0

## 2023-12-10 ENCOUNTER — Other Ambulatory Visit (HOSPITAL_BASED_OUTPATIENT_CLINIC_OR_DEPARTMENT_OTHER): Payer: Self-pay

## 2024-01-15 ENCOUNTER — Other Ambulatory Visit (HOSPITAL_BASED_OUTPATIENT_CLINIC_OR_DEPARTMENT_OTHER): Payer: Self-pay

## 2024-01-15 MED ORDER — DOXYCYCLINE HYCLATE 100 MG PO CAPS
100.0000 mg | ORAL_CAPSULE | Freq: Two times a day (BID) | ORAL | 0 refills | Status: AC
  Filled 2024-01-15: qty 20, 10d supply, fill #0

## 2024-02-03 ENCOUNTER — Other Ambulatory Visit (HOSPITAL_BASED_OUTPATIENT_CLINIC_OR_DEPARTMENT_OTHER): Payer: Self-pay

## 2024-02-04 ENCOUNTER — Other Ambulatory Visit (HOSPITAL_BASED_OUTPATIENT_CLINIC_OR_DEPARTMENT_OTHER): Payer: Self-pay

## 2024-02-04 MED ORDER — LORAZEPAM 1 MG PO TABS
ORAL_TABLET | ORAL | 1 refills | Status: AC
  Filled 2024-02-04: qty 135, 68d supply, fill #0
  Filled 2024-04-16: qty 135, 68d supply, fill #1

## 2024-02-27 ENCOUNTER — Other Ambulatory Visit (HOSPITAL_BASED_OUTPATIENT_CLINIC_OR_DEPARTMENT_OTHER): Payer: Self-pay

## 2024-04-16 ENCOUNTER — Other Ambulatory Visit: Payer: Self-pay

## 2024-04-16 ENCOUNTER — Other Ambulatory Visit (HOSPITAL_BASED_OUTPATIENT_CLINIC_OR_DEPARTMENT_OTHER): Payer: Self-pay
